# Patient Record
Sex: Female | Born: 1935 | Race: White | Hispanic: No | Smoking: Never smoker
Health system: Southern US, Community
[De-identification: ages and names within clinical notes are randomized; demographics above are authoritative.]

## PROBLEM LIST (undated history)

## (undated) DIAGNOSIS — M810 Age-related osteoporosis without current pathological fracture: Secondary | ICD-10-CM

## (undated) DIAGNOSIS — H8109 Meniere's disease, unspecified ear: Secondary | ICD-10-CM

## (undated) DIAGNOSIS — G43909 Migraine, unspecified, not intractable, without status migrainosus: Secondary | ICD-10-CM

## (undated) DIAGNOSIS — F419 Anxiety disorder, unspecified: Secondary | ICD-10-CM

## (undated) DIAGNOSIS — I10 Essential (primary) hypertension: Secondary | ICD-10-CM

## (undated) DIAGNOSIS — G4700 Insomnia, unspecified: Secondary | ICD-10-CM

## (undated) DIAGNOSIS — F32A Depression, unspecified: Secondary | ICD-10-CM

## (undated) DIAGNOSIS — R011 Cardiac murmur, unspecified: Secondary | ICD-10-CM

## (undated) DIAGNOSIS — R413 Other amnesia: Secondary | ICD-10-CM

## (undated) DIAGNOSIS — F329 Major depressive disorder, single episode, unspecified: Secondary | ICD-10-CM

## (undated) HISTORY — DX: Cardiac murmur, unspecified: R01.1

## (undated) HISTORY — PX: DILATION AND CURETTAGE OF UTERUS: SHX78

## (undated) HISTORY — DX: Insomnia, unspecified: G47.00

## (undated) HISTORY — DX: Anxiety disorder, unspecified: F41.9

## (undated) HISTORY — PX: APPENDECTOMY: SHX54

## (undated) HISTORY — DX: Other amnesia: R41.3

## (undated) HISTORY — PX: BREAST LUMPECTOMY: SHX2

## (undated) HISTORY — DX: Essential (primary) hypertension: I10

---

## 1997-09-06 ENCOUNTER — Ambulatory Visit (HOSPITAL_COMMUNITY): Admission: RE | Admit: 1997-09-06 | Discharge: 1997-09-06 | Payer: Self-pay | Admitting: Rheumatology

## 1997-11-08 ENCOUNTER — Emergency Department (HOSPITAL_COMMUNITY): Admission: EM | Admit: 1997-11-08 | Discharge: 1997-11-08 | Payer: Self-pay

## 1997-11-21 ENCOUNTER — Ambulatory Visit (HOSPITAL_COMMUNITY): Admission: RE | Admit: 1997-11-21 | Discharge: 1997-11-21 | Payer: Self-pay | Admitting: Family Medicine

## 1997-11-21 ENCOUNTER — Encounter: Payer: Self-pay | Admitting: Family Medicine

## 1998-12-05 ENCOUNTER — Encounter: Admission: RE | Admit: 1998-12-05 | Discharge: 1998-12-05 | Payer: Self-pay | Admitting: Obstetrics and Gynecology

## 1998-12-05 ENCOUNTER — Encounter: Payer: Self-pay | Admitting: Obstetrics and Gynecology

## 1999-07-19 ENCOUNTER — Encounter: Payer: Self-pay | Admitting: Obstetrics and Gynecology

## 1999-07-19 ENCOUNTER — Ambulatory Visit (HOSPITAL_COMMUNITY): Admission: RE | Admit: 1999-07-19 | Discharge: 1999-07-19 | Payer: Self-pay | Admitting: Obstetrics and Gynecology

## 1999-09-02 ENCOUNTER — Encounter: Payer: Self-pay | Admitting: Family Medicine

## 1999-09-02 ENCOUNTER — Ambulatory Visit (HOSPITAL_COMMUNITY): Admission: RE | Admit: 1999-09-02 | Discharge: 1999-09-02 | Payer: Self-pay | Admitting: Family Medicine

## 2000-05-25 ENCOUNTER — Ambulatory Visit (HOSPITAL_COMMUNITY): Admission: RE | Admit: 2000-05-25 | Discharge: 2000-05-25 | Payer: Self-pay

## 2000-05-25 ENCOUNTER — Encounter (INDEPENDENT_AMBULATORY_CARE_PROVIDER_SITE_OTHER): Payer: Self-pay

## 2000-05-27 ENCOUNTER — Other Ambulatory Visit: Admission: RE | Admit: 2000-05-27 | Discharge: 2000-05-27 | Payer: Self-pay | Admitting: Obstetrics and Gynecology

## 2000-07-28 ENCOUNTER — Encounter: Admission: RE | Admit: 2000-07-28 | Discharge: 2000-07-28 | Payer: Self-pay | Admitting: Obstetrics and Gynecology

## 2000-07-28 ENCOUNTER — Encounter: Payer: Self-pay | Admitting: Obstetrics and Gynecology

## 2000-12-16 ENCOUNTER — Encounter: Admission: RE | Admit: 2000-12-16 | Discharge: 2000-12-16 | Payer: Self-pay | Admitting: Obstetrics and Gynecology

## 2000-12-16 ENCOUNTER — Encounter: Payer: Self-pay | Admitting: Obstetrics and Gynecology

## 2001-03-02 ENCOUNTER — Ambulatory Visit (HOSPITAL_BASED_OUTPATIENT_CLINIC_OR_DEPARTMENT_OTHER): Admission: RE | Admit: 2001-03-02 | Discharge: 2001-03-02 | Payer: Self-pay | Admitting: Plastic Surgery

## 2001-03-02 ENCOUNTER — Encounter (INDEPENDENT_AMBULATORY_CARE_PROVIDER_SITE_OTHER): Payer: Self-pay | Admitting: Specialist

## 2001-06-01 ENCOUNTER — Other Ambulatory Visit: Admission: RE | Admit: 2001-06-01 | Discharge: 2001-06-01 | Payer: Self-pay | Admitting: Gynecology

## 2001-08-04 ENCOUNTER — Encounter: Payer: Self-pay | Admitting: Emergency Medicine

## 2001-08-04 ENCOUNTER — Emergency Department (HOSPITAL_COMMUNITY): Admission: EM | Admit: 2001-08-04 | Discharge: 2001-08-04 | Payer: Self-pay | Admitting: Emergency Medicine

## 2001-09-07 ENCOUNTER — Encounter: Admission: RE | Admit: 2001-09-07 | Discharge: 2001-09-07 | Payer: Self-pay | Admitting: Gynecology

## 2001-09-07 ENCOUNTER — Encounter: Payer: Self-pay | Admitting: Gynecology

## 2002-08-31 ENCOUNTER — Encounter: Payer: Self-pay | Admitting: Gynecology

## 2002-08-31 ENCOUNTER — Encounter: Admission: RE | Admit: 2002-08-31 | Discharge: 2002-08-31 | Payer: Self-pay | Admitting: Gynecology

## 2002-09-02 ENCOUNTER — Encounter: Payer: Self-pay | Admitting: Gynecology

## 2002-09-02 ENCOUNTER — Encounter: Admission: RE | Admit: 2002-09-02 | Discharge: 2002-09-02 | Payer: Self-pay | Admitting: Gynecology

## 2003-06-07 ENCOUNTER — Other Ambulatory Visit: Admission: RE | Admit: 2003-06-07 | Discharge: 2003-06-07 | Payer: Self-pay | Admitting: Gynecology

## 2003-09-05 ENCOUNTER — Encounter: Admission: RE | Admit: 2003-09-05 | Discharge: 2003-09-05 | Payer: Self-pay | Admitting: Gynecology

## 2004-09-24 ENCOUNTER — Encounter: Admission: RE | Admit: 2004-09-24 | Discharge: 2004-09-24 | Payer: Self-pay | Admitting: Gynecology

## 2005-06-26 ENCOUNTER — Other Ambulatory Visit: Admission: RE | Admit: 2005-06-26 | Discharge: 2005-06-26 | Payer: Self-pay | Admitting: Gynecology

## 2005-10-21 ENCOUNTER — Encounter: Admission: RE | Admit: 2005-10-21 | Discharge: 2005-10-21 | Payer: Self-pay | Admitting: Family Medicine

## 2006-10-28 ENCOUNTER — Encounter: Admission: RE | Admit: 2006-10-28 | Discharge: 2006-10-28 | Payer: Self-pay | Admitting: Gynecology

## 2007-10-29 ENCOUNTER — Encounter: Admission: RE | Admit: 2007-10-29 | Discharge: 2007-10-29 | Payer: Self-pay | Admitting: Family Medicine

## 2008-09-13 ENCOUNTER — Encounter (INDEPENDENT_AMBULATORY_CARE_PROVIDER_SITE_OTHER): Payer: Self-pay | Admitting: Internal Medicine

## 2008-09-13 ENCOUNTER — Inpatient Hospital Stay (HOSPITAL_COMMUNITY): Admission: EM | Admit: 2008-09-13 | Discharge: 2008-09-14 | Payer: Self-pay | Admitting: Emergency Medicine

## 2008-09-13 ENCOUNTER — Ambulatory Visit: Payer: Self-pay | Admitting: Vascular Surgery

## 2008-09-14 ENCOUNTER — Encounter (INDEPENDENT_AMBULATORY_CARE_PROVIDER_SITE_OTHER): Payer: Self-pay | Admitting: Internal Medicine

## 2008-10-31 ENCOUNTER — Encounter: Admission: RE | Admit: 2008-10-31 | Discharge: 2008-10-31 | Payer: Self-pay | Admitting: Gynecology

## 2009-01-29 ENCOUNTER — Encounter: Admission: RE | Admit: 2009-01-29 | Discharge: 2009-01-29 | Payer: Self-pay | Admitting: Psychiatry

## 2009-11-21 ENCOUNTER — Encounter: Admission: RE | Admit: 2009-11-21 | Discharge: 2009-11-21 | Payer: Self-pay | Admitting: Family Medicine

## 2010-05-01 ENCOUNTER — Other Ambulatory Visit: Payer: Self-pay | Admitting: Gastroenterology

## 2010-05-01 DIAGNOSIS — R1030 Lower abdominal pain, unspecified: Secondary | ICD-10-CM

## 2010-05-03 ENCOUNTER — Ambulatory Visit
Admission: RE | Admit: 2010-05-03 | Discharge: 2010-05-03 | Disposition: A | Discharge status: Home / Self Care | Payer: Medicare Other | Source: Ambulatory Visit | Attending: Gastroenterology | Admitting: Gastroenterology

## 2010-05-03 DIAGNOSIS — R1030 Lower abdominal pain, unspecified: Secondary | ICD-10-CM

## 2010-05-03 MED ORDER — IOHEXOL 300 MG/ML  SOLN
75.0000 mL | Freq: Once | INTRAMUSCULAR | Status: AC | PRN
  Administered 2010-05-03: 75 mL via INTRAVENOUS

## 2010-05-18 LAB — URINALYSIS, ROUTINE W REFLEX MICROSCOPIC
Glucose, UA: NEGATIVE mg/dL
Hgb urine dipstick: NEGATIVE
Specific Gravity, Urine: 1.012 (ref 1.005–1.030)
Urobilinogen, UA: 0.2 mg/dL (ref 0.0–1.0)
pH: 8.5 — ABNORMAL HIGH (ref 5.0–8.0)

## 2010-05-18 LAB — CARDIAC PANEL(CRET KIN+CKTOT+MB+TROPI)
Relative Index: INVALID (ref 0.0–2.5)
Relative Index: INVALID (ref 0.0–2.5)
Total CK: 66 U/L (ref 7–177)
Troponin I: 0.02 ng/mL (ref 0.00–0.06)

## 2010-05-18 LAB — DIFFERENTIAL
Basophils Absolute: 0 10*3/uL (ref 0.0–0.1)
Eosinophils Absolute: 0.1 10*3/uL (ref 0.0–0.7)
Eosinophils Relative: 1 % (ref 0–5)
Lymphocytes Relative: 12 % (ref 12–46)

## 2010-05-18 LAB — POCT CARDIAC MARKERS

## 2010-05-18 LAB — CBC
Hemoglobin: 13.1 g/dL (ref 12.0–15.0)
MCV: 93.7 fL (ref 78.0–100.0)
RBC: 4.17 MIL/uL (ref 3.87–5.11)
WBC: 6.2 10*3/uL (ref 4.0–10.5)

## 2010-05-18 LAB — COMPREHENSIVE METABOLIC PANEL
ALT: 19 U/L (ref 0–35)
AST: 28 U/L (ref 0–37)
CO2: 27 mEq/L (ref 19–32)
Chloride: 106 mEq/L (ref 96–112)
Creatinine, Ser: 0.66 mg/dL (ref 0.4–1.2)
GFR calc Af Amer: >60 mL/min (ref >60)
GFR calc non Af Amer: >60 mL/min (ref >60)
Glucose, Bld: 95 mg/dL (ref 70–99)
Total Bilirubin: 0.6 mg/dL (ref 0.3–1.2)

## 2010-06-25 NOTE — H&P (Signed)
[name], [name] NO.:  [id number]   MEDICAL RECORD NO.:  [id number]          PATIENT TYPE:  EMS   LOCATION:  ED                           FACILITY:  [facility]   PHYSICIAN:  Hollice Espy, M.D.DATE OF BIRTH:  10-Sep-1932   DATE OF ADMISSION:  09/13/2008  DATE OF DISCHARGE:                              HISTORY & PHYSICAL   CHIEF COMPLAINT:  Passed out.   HISTORY OF PRESENT ILLNESS:  Jeanne Lopez is a 77 year old female who  presented to the emergency department following a syncopal event.  She  notes that she had a mechanical fall yesterday, tripping on a curb while  walking into a drugstore.  She injured her right hand during that fall.  Today, she got up to go to the bathroom this morning and felt dizzy.  She made it to the toilet where she urinated.  Upon standing, she felt  more dizzy, attempted to walk back to her bed, but passed out en route,  striking her brow bone on a chair.  The daughter found her and noted  that she had some left upper extremity twitching and loss of  consciousness for approximately 1 minute.  This was followed by an  approximately 10-15 minute period of altered mental status.  She was  brought to the [facility] Emergency Department and we were asked to  admit for further evaluation and treatment.   ALLERGIES:  CODEINE causes vomiting.   PAST MEDICAL HISTORY:  1. GERD.  2. Meniere's disease.  3. Basal cell carcinoma of the right forehead and left cheek.  4. History of tachycardia.   PAST SURGICAL HISTORY:  1. Excision of skin cancer January 2003.  2. Cyst removal from breast.   SOCIAL HISTORY:  She lives with her daughter.  She never drank, smoked,  or did drugs.  She is widowed.  She has 3 children.   FAMILY HISTORY:  Mother deceased at age 46 secondary to MI.  Dad  deceased at age 65 secondary to pneumonia.   MEDICATIONS:  1. Valium 1 mg p.o. q.12 hours p.r.n. dizziness.  2. Boniva 150 mg p.o. monthly.  3. Cymbalta  30 mg p.o. t.i.d.  4. Ambien 5 mg tablets 1/2 tablet p.o. nightly p.r.n. for sleep.  5. Calcium plus D 600 mg p.o. b.i.d.  6. Imitrex 50 mg p.o. daily as needed for headache.  7. Miralax 17 g p.o. daily as needed.  8. Vitamin D 1000 IU p.o. daily.  9. Multivitamin 1 tab p.o. daily.   REVIEW OF SYSTEMS:  GENERAL:  She easily tires.  Denies fever.  CARDIOVASCULAR:  No chest pain.  No palpitations.  RESPIRATORY:  Positive shortness of breath/dyspnea on exertion.  Denies orthopnea.  Has had positive cough.  GASTROINTESTINAL:  Denies nausea, vomiting, or  diarrhea.  GENITOURINARY:  Denies dysuria or hematuria.  MUSCULOSKELETAL:  Positive pain in the right hand.  NEURO:  Denies  blurred vision or numbness.  PSYCHIATRIC:  Mild depression/anxiety.  HEMATOLOGIC:  Denies bruising or bleeding.  ENT:  Had sore throat a few  days ago, which has now resolved.  RADIOLOGY:  CT head negative for bleed or acute intracranial process.  Chest x-ray, no active disease.  X-ray of the right hand, no acute  fracture.  Does note remote trauma to the fifth metacarpal and advanced  osteoarthritis with questionable erosive component.   DIAGNOSTIC STUDIES:  Sodium 136, potassium 4.3, chloride 106, bicarb 27,  BUN 12, creatinine 0.66, glucose 95.  Point of care enzymes negative.  Hemoglobin 15.1, hematocrit 39.1, white blood cell count 6.2, platelets  200,000.  Urinalysis negative.   PHYSICAL EXAM:  VITAL SIGNS:  BP 122/60, heart rate 87, respiratory rate  18, temperature 98.7, O2 saturation 97% on room air.  GENERAL:  Elderly white female awake and alert, in no acute distress.  NECK:  No masses.  CARDIOVASCULAR:  S1, S2 regular rate and rhythm.  No lower extremity  edema.  No carotid bruits noted.  RESPIRATORY:  Breath sounds clear to auscultation bilaterally without  wheeze, rales, or rhonchi.  No increased work of breathing.  ABDOMEN:  Soft, nontender, nondistended.  Positive bowel sounds noted.   PSYCHIATRIC:  Alert and oriented x3.  Pleasant.  NEURO:  Moving all extremities.  Speech is clear.  Strong and equal hand  grasps bilaterally.  Positive facial symmetry.  MUSCULOSKELETAL:  Positive redness and swelling to the right hand.  SKIN:  Positive ecchymosis to the left brow.   ASSESSMENT AND PLAN:  1. Syncope with fall.  The patient also has some left upper extremity      shaking with this event.  The dizziness leading up to the event      seems most consistent with syncope, more so than seizure.  We will      monitor the patient on telemetry, plan to cycle cardiac enzymes,      check carotid duplex and 2D echo.  Her CT of her head was negative.      We will also check orthostatics and a D-dimer secondary to the      patient's complaint of shortness of breath to rule out pulmonary      embolism.  2. History of Meniere's disease.  Question if this may be contributing      to #1.  This will need outpatient followup.  3. History of gastroesophageal reflux disease.  No complaints.  Not      currently on PPI.  Will monitor.  4. History of tachycardia.  Her heart rate is stable today.  We will      check a TSH and place her in a telemetry bed.  5. History of basal cell carcinoma, status post resection.  Plan      outpatient followup.  6. History of injury to right hand.  No fracture on x-ray.  Plan to      apply ice p.r.n.  7. Osteoporosis.  On Boniva, calcium, and vitamin D.  8. Depression/anxiety.  Continue Cymbalta, stable.  9. History of migraine headaches.  Continue p.r.n. Imitrex.  She notes      that she uses this drug several times per week at home.      Sandford Craze, NP      Hollice Espy, M.D.  Electronically Signed    MO/MEDQ  D:  09/13/2008  T:  09/13/2008  Job:  [id number]   cc:   Dario Guardian, M.D.  Fax: [phone]

## 2010-06-28 NOTE — Op Note (Signed)
[facility]  Patient:    SHAI, RASMUSSEN Visit Number: [id number] MRN: [id number]          Service Type: DSU Location: [facility] Attending Physician:  Loura Halt Ii Dictated by:   Alfredia Ferguson, M.D. Proc. Date: 03/02/01 Admit Date:  03/02/2001 Discharge Date: 03/02/2001   CC:         Dr. Venancio Poisson   Operative Report  PREOPERATIVE DIAGNOSIS: 1. Biopsy proven basal cell carcinoma, right forehead just above eyebrow, 3 mm    in diameter. 2. A 5 mm basal cell carcinoma, left cheek.  POSTOPERATIVE DIAGNOSIS: 1. Biopsy proven basal cell carcinoma, right forehead just above eyebrow, 3 mm    in diameter. 2. A 5 mm basal cell carcinoma, left cheek.  OPERATION PERFORMED: 1. Excision basal cell carcinoma, right forehead above eyebrow. 2. Excision of biopsy proven basal cell carcinoma, left cheek.  SURGEON:  Alfredia Ferguson, M.D.  ANESTHESIA:  2% Xylocaine 1:100,000 epinephrine.  INDICATIONS FOR PROCEDURE:  The patient is a 77 year old woman with biopsy proven basal cell carcinoma in two locations on her face.  She wishes to have these areas excised to  verify clear margins.  She understands she will be trading what she has for a permanent potentially unsightly scar.  In spite of that, she wishes to proceed with the operation.  DESCRIPTION OF PROCEDURE:  Elliptical skin markers were placed around the lesion on the right forehead and also the left cheek.  Local anesthesia using 2% Xylocaine 1:100,000 epinephrine was infiltrated.  The patients face was prepped and draped in sterile fashion.  After waiting approximately 10 minutes, an elliptical excision of the lesion above the eyebrow was carried out.  The wound edges were undermined for a distance of 2 to 3 mm in all directions.  Hemostasis accomplished using cautery. The wound was closed by approximating the dermis using interrupted 5-0 Vicryl suture.  The skin was united using an  interrupted 6-0 nylon suture.  Attention was directed to the left cheek where an elliptical excision of the biopsy site was carried out. The lesion was passed off for pathology as was the one above the eyebrow.  The wound edges were undermined for a distance of several millimeters in all directions.  Hemostasis was accomplished using cautery.  After achieving hemostasis, the wound was approximated using 5-0 Vicryl suture for the dermis followed by a running 6-0 nylon suture for the skin edges.  Light dressings were applied to both areas.  The patient tolerated the procedure well and was discharged home in satisfactory condition. Dictated by:   Alfredia Ferguson, M.D. Attending Physician:  Loura Halt Ii DD:  03/16/01 TD:  03/17/01 Job: [id number]

## 2010-06-28 NOTE — Procedures (Signed)
[facility]  Patient:    Jeanne Lopez, Jeanne Lopez                      MRN: [id number] Proc. Date: 05/25/00 Adm. Date:  [id number] Disc. Date: [id number] Attending:  Louie Bun CC:         Dario Guardian, M.D.   Procedure Report  PROCEDURE:  Colonoscopy.  INDICATION FOR PROCEDURE:  Family history of colon polyps.  DESCRIPTION OF PROCEDURE:  The patient was placed in the left lateral decubitus position then placed on the pulse monitor with continuous low flow oxygen delivered by nasal cannula. She was sedated with 70 mg IV Demerol and 8 mg IV Versed. The Olympus video colonoscope was inserted into the rectum and advanced to the cecum, confirmed by transillumination at McBurneys point and visualization of the ileocecal valve and appendiceal orifice. The prep was excellent. The cecum, ascending, transverse, descending and sigmoid colon all appeared normal with no masses, polyps, diverticula or other mucosal abnormalities. Within the rectum, there were two small polyps at approximately 12 cm adjacent to each other one 1 cm in diameter and the other 8 mm in diameter and these were both fulgurated by hot biopsy. The remainder of the rectum appeared normal with no further masses, polyps, diverticula or other mucosal abnormalities. The scope was then withdrawn and the patient returned to the recovery room in stable condition. The patient tolerated the procedure well and there were no immediate complications.  IMPRESSION:  Small rectal polyps otherwise normal colonoscopy.  PLAN:  Await biopsy results for determination of method and interval for future colon screening. DD:  05/25/00 TD:  05/25/00 Job: [id number]

## 2010-10-10 ENCOUNTER — Other Ambulatory Visit: Payer: Self-pay | Admitting: Family Medicine

## 2010-10-10 DIAGNOSIS — Z1231 Encounter for screening mammogram for malignant neoplasm of breast: Secondary | ICD-10-CM

## 2010-11-18 ENCOUNTER — Ambulatory Visit
Admission: RE | Admit: 2010-11-18 | Discharge: 2010-11-18 | Disposition: A | Discharge status: Home / Self Care | Payer: Medicare Other | Source: Ambulatory Visit | Attending: Family Medicine | Admitting: Family Medicine

## 2010-11-18 DIAGNOSIS — Z1231 Encounter for screening mammogram for malignant neoplasm of breast: Secondary | ICD-10-CM

## 2010-11-25 ENCOUNTER — Ambulatory Visit: Payer: Medicare Other

## 2011-01-27 ENCOUNTER — Other Ambulatory Visit: Payer: Self-pay | Admitting: Neurology

## 2011-01-27 DIAGNOSIS — IMO0002 Reserved for concepts with insufficient information to code with codable children: Secondary | ICD-10-CM

## 2011-01-31 ENCOUNTER — Ambulatory Visit
Admission: RE | Admit: 2011-01-31 | Discharge: 2011-01-31 | Disposition: A | Discharge status: Home / Self Care | Payer: Medicare Other | Source: Ambulatory Visit | Attending: Neurology | Admitting: Neurology

## 2011-01-31 DIAGNOSIS — IMO0002 Reserved for concepts with insufficient information to code with codable children: Secondary | ICD-10-CM

## 2011-10-28 ENCOUNTER — Other Ambulatory Visit: Payer: Self-pay | Admitting: Family Medicine

## 2011-10-28 DIAGNOSIS — Z1231 Encounter for screening mammogram for malignant neoplasm of breast: Secondary | ICD-10-CM

## 2011-11-20 ENCOUNTER — Ambulatory Visit
Admission: RE | Admit: 2011-11-20 | Discharge: 2011-11-20 | Disposition: A | Discharge status: Home / Self Care | Payer: Medicare Other | Source: Ambulatory Visit | Attending: Family Medicine | Admitting: Family Medicine

## 2011-11-20 DIAGNOSIS — Z1231 Encounter for screening mammogram for malignant neoplasm of breast: Secondary | ICD-10-CM

## 2012-08-01 ENCOUNTER — Encounter (HOSPITAL_COMMUNITY): Payer: Self-pay | Admitting: Emergency Medicine

## 2012-08-01 ENCOUNTER — Emergency Department (HOSPITAL_COMMUNITY)
Admission: EM | Admit: 2012-08-01 | Discharge: 2012-08-01 | Disposition: A | Discharge status: Home / Self Care | Payer: Medicare Other | Attending: Emergency Medicine | Admitting: Emergency Medicine

## 2012-08-01 ENCOUNTER — Emergency Department (HOSPITAL_COMMUNITY): Payer: Medicare Other

## 2012-08-01 DIAGNOSIS — R42 Dizziness and giddiness: Secondary | ICD-10-CM | POA: Insufficient documentation

## 2012-08-01 DIAGNOSIS — F3289 Other specified depressive episodes: Secondary | ICD-10-CM | POA: Insufficient documentation

## 2012-08-01 DIAGNOSIS — R55 Syncope and collapse: Secondary | ICD-10-CM | POA: Insufficient documentation

## 2012-08-01 DIAGNOSIS — IMO0002 Reserved for concepts with insufficient information to code with codable children: Secondary | ICD-10-CM | POA: Insufficient documentation

## 2012-08-01 DIAGNOSIS — F329 Major depressive disorder, single episode, unspecified: Secondary | ICD-10-CM | POA: Insufficient documentation

## 2012-08-01 DIAGNOSIS — G43909 Migraine, unspecified, not intractable, without status migrainosus: Secondary | ICD-10-CM | POA: Insufficient documentation

## 2012-08-01 DIAGNOSIS — M810 Age-related osteoporosis without current pathological fracture: Secondary | ICD-10-CM | POA: Insufficient documentation

## 2012-08-01 DIAGNOSIS — Z8669 Personal history of other diseases of the nervous system and sense organs: Secondary | ICD-10-CM | POA: Insufficient documentation

## 2012-08-01 DIAGNOSIS — S8000XA Contusion of unspecified knee, initial encounter: Secondary | ICD-10-CM | POA: Insufficient documentation

## 2012-08-01 DIAGNOSIS — S8002XA Contusion of left knee, initial encounter: Secondary | ICD-10-CM

## 2012-08-01 DIAGNOSIS — Y92410 Unspecified street and highway as the place of occurrence of the external cause: Secondary | ICD-10-CM | POA: Insufficient documentation

## 2012-08-01 DIAGNOSIS — R296 Repeated falls: Secondary | ICD-10-CM | POA: Insufficient documentation

## 2012-08-01 DIAGNOSIS — Z7983 Long term (current) use of bisphosphonates: Secondary | ICD-10-CM | POA: Insufficient documentation

## 2012-08-01 DIAGNOSIS — Y9301 Activity, walking, marching and hiking: Secondary | ICD-10-CM | POA: Insufficient documentation

## 2012-08-01 DIAGNOSIS — Z79899 Other long term (current) drug therapy: Secondary | ICD-10-CM | POA: Insufficient documentation

## 2012-08-01 HISTORY — DX: Meniere's disease, unspecified ear: H81.09

## 2012-08-01 HISTORY — DX: Age-related osteoporosis without current pathological fracture: M81.0

## 2012-08-01 HISTORY — DX: Depression, unspecified: F32.A

## 2012-08-01 HISTORY — DX: Major depressive disorder, single episode, unspecified: F32.9

## 2012-08-01 HISTORY — DX: Migraine, unspecified, not intractable, without status migrainosus: G43.909

## 2012-08-01 LAB — CBC WITH DIFFERENTIAL/PLATELET
Hemoglobin: 12.3 g/dL (ref 12.0–15.0)
Lymphocytes Relative: 27 % (ref 12–46)
Lymphs Abs: 1.6 10*3/uL (ref 0.7–4.0)
Neutro Abs: 3.4 10*3/uL (ref 1.7–7.7)
Neutrophils Relative %: 56 % (ref 43–77)
Platelets: 145 10*3/uL — ABNORMAL LOW (ref 150–400)
RBC: 4.01 MIL/uL (ref 3.87–5.11)
WBC: 6 10*3/uL (ref 4.0–10.5)

## 2012-08-01 LAB — POCT I-STAT, CHEM 8
BUN: 17 mg/dL (ref 6–23)
Chloride: 97 mEq/L (ref 96–112)
Creatinine, Ser: 0.8 mg/dL (ref 0.50–1.10)
Hemoglobin: 13.6 g/dL (ref 12.0–15.0)
Potassium: 3.9 mEq/L (ref 3.5–5.1)
Sodium: 133 mEq/L — ABNORMAL LOW (ref 135–145)

## 2012-08-01 NOTE — ED Notes (Signed)
[id number]<AV> Expected date:<BR> Expected time:<BR> Means of arrival:<BR> Comments:<BR>

## 2012-08-01 NOTE — ED Provider Notes (Signed)
History    CSN: [id number] Arrival date & time 08/01/12  0720 First MD Initiated Contact with Patient 08/01/12 [phone]      Chief Complaint  Patient presents with  . Fall  . Knee Injury   HPI  79 year old female presents with a fall due to knee pain.   Patient was out walking her dog yesterday (20 minute walk, hot day, had not had water yet that day). States she felt slightly lightheaded but no chest pain, shortness of breath, palpitations, neck or arm pain, diaphoresis, sweating. Before she had a chance to sit down, states she passed out and next thing she remember she was getting off the ground. Denies fecal or urinary incontinence or tongue biting. Noted she had bruised/injured left knee, right side of nose, and left forehead slightly. Able to walk home without difficulty. Was cleaned up by daughters. They did not bring her to ED because she felt reasonably well and she had a history of syncope in the past few years which was being followed by her primary care doctor. No blood from nose or mouth was noted after fall.  This morning, stood up and walked to end of bed. Knee has swollen overnight and was very painful, patient fell to ground as a result. Did not hit her head. Did not have chest pain, shortness of breath, palpitations, fecal/urinary incontinence. Was a little shaken up by the pain but once she got up, her pain improved. She has not been lightheaded, dizzy or passed out since yesterday.   Past Medical History  Diagnosis Date  . Osteoporosis   . Migraine   . Meniere's disease   . Depression     History reviewed. No pertinent past surgical history.  No family history on file.  History  Substance Use Topics  . Smoking status: Never Smoker   . Smokeless tobacco: Not on file  . Alcohol Use: No   History   Social History Narrative   Lives with daughter and dog. Grandchildren in frequently to help her.    Retired.     Review of Systems A full 10 point review of symptoms  was performed and was negative except as noted in HPI.   Allergies  Review of patient's allergies indicates no known allergies.  Home Medications   Current Outpatient Rx  Name  Route  Sig  Dispense  Refill  . ibandronate (BONIVA) 150 MG tablet   Oral   Take 150 mg by mouth every 30 (thirty) days. Take in the morning with a full glass of water, on an empty stomach, and do not take anything else by mouth or lie down for the next 30 min.         . SUMAtriptan Succinate (IMITREX PO)   Oral   Take 1 tablet by mouth daily as needed (migraine).         . venlafaxine XR (EFFEXOR-XR) 150 MG 24 hr capsule   Oral   Take 150 mg by mouth daily.           BP 110/59  Pulse 68  Temp(Src) 97.8 F (36.6 C) (Oral)  Resp 16  SpO2 98%  Physical Exam  Constitutional: She is oriented to person, place, and time. She appears well-developed and well-nourished. No distress.  HENT:  Head: Normocephalic.  Small abrasion to right upper nose and left forehead. Nontender to palpation.   Eyes: EOM are normal. Pupils are equal, round, and reactive to light.  Neck: Normal range of  motion. Neck supple.  Cardiovascular: Normal rate and regular rhythm.  Exam reveals no gallop and no friction rub.   No murmur heard. Pulmonary/Chest: Effort normal and breath sounds normal. She has no wheezes. She has no rales.  Abdominal: Soft. Bowel sounds are normal. There is no tenderness. There is no rebound.  Musculoskeletal: She exhibits edema (around knee but without effusion).  Left Knee: Obvious bruising diffusely around patella, mild warmth. Otherwise no effusion or obvious bony abnormalities.  Palpation normal with no joint line tenderness or patellar tenderness or condyle tenderness. ROM normal in flexion (but patient moves slowly) and extension and lower leg rotation. Ligaments with solid consistent endpoints including ACL, PCL, LCL, MCL. Patient holds leg cautiously and does not allow Mcmurray's testing.   Non painful patellar compression. Patellar and quadriceps tendons unremarkable. Hamstring and quadriceps strength is normal.   Neurological: She is alert and oriented to person, place, and time. No cranial nerve deficit. She exhibits normal muscle tone. Coordination normal.  Skin: Skin is warm and dry.     Date: 08/01/2012  Rate: 77  Rhythm: normal sinus rhythm  QRS Axis: left  Intervals: normal  ST/T Wave abnormalities: t wave inversion in avl, v1, v2.   Conduction Disutrbances:none  Narrative Interpretation: possible right axis hypertrophy  Old EKG Reviewed: none available   ED Course  Procedures (including critical care time)  Labs Reviewed  CBC WITH DIFFERENTIAL - Abnormal; Notable for the following:    Platelets 145 (*)    Monocytes Relative 15 (*)    All other components within normal limits  POCT I-STAT, CHEM 8 - Abnormal; Notable for the following:    Sodium 133 (*)    All other components within normal limits   Dg Knee Complete 4 Views Left  08/01/2012   *RADIOLOGY REPORT*  Clinical Data: Fall with left knee injury and pain.  LEFT KNEE - COMPLETE 4+ VIEW  Comparison: None.  Findings: No acute fracture or dislocation is identified.  Very mild degenerative changes are present.  No joint effusion, bony lesion or soft tissue foreign body is seen.  IMPRESSION: No acute findings.  Mild degenerative changes.   Original Report Authenticated By: Irish Lack, M.D.    1. Knee contusion, left, initial encounter   2. Syncope    MDM  Patient presents today primarily for knee pain after a fall. No fracture on x-ray and stable knee exam without ligamentous injury. Injury primarily due to contusion/bruising. Patient to ice knee. Family has a walker at home which she will start using. Tylenol for pain.   Explained to patient we are more concerned about her syncopal event. She has had echo, carotid dopplers, had negative CT head/MRI in 2010. Patient was not worked up for seizures  at that time and she states she did not wear an event monitor although she is not sure. Both of these can be completed outpatient as patient currently asymptomatic and only issue at present time is fall on knee. She is to follow up early this week. If any recurrence of symptoms, will return to ED for repeat syncope evaluation. Very mild hyponatremia also noted-possibly dehydration related-did not give fluid bolus but discussed encouraging PO intake and consider repeat labs with PCP.    Shelva Majestic, MD 08/01/12 [phone]

## 2012-08-01 NOTE — ED Notes (Signed)
Per EMS: fell yesterday walking on [address]. abrasion to forehead and nose, left knee injury. Denies LOC. Larey Seat this morning due to left knee injury, no other injuries.

## 2012-08-01 NOTE — ED Notes (Signed)
Patient transported to X-ray 

## 2012-08-01 NOTE — ED Provider Notes (Signed)
I saw and evaluated the patient, reviewed the resident's note and I agree with the findings and plan.   .Face to face Exam:  General:  Awake HEENT:  Atraumatic Resp:  Normal effort Abd:  Nondistended Neuro:No focal weakness   Nelia Shi, MD 08/01/12 1406

## 2012-11-29 ENCOUNTER — Other Ambulatory Visit: Payer: Self-pay

## 2012-11-29 DIAGNOSIS — Z1231 Encounter for screening mammogram for malignant neoplasm of breast: Secondary | ICD-10-CM

## 2012-12-29 ENCOUNTER — Ambulatory Visit
Admission: RE | Admit: 2012-12-29 | Discharge: 2012-12-29 | Disposition: A | Discharge status: Home / Self Care | Payer: 59 | Source: Ambulatory Visit

## 2012-12-29 DIAGNOSIS — Z1231 Encounter for screening mammogram for malignant neoplasm of breast: Secondary | ICD-10-CM

## 2013-08-11 ENCOUNTER — Encounter: Payer: Self-pay | Admitting: Internal Medicine

## 2013-08-11 ENCOUNTER — Ambulatory Visit (INDEPENDENT_AMBULATORY_CARE_PROVIDER_SITE_OTHER): Payer: 59 | Admitting: Internal Medicine

## 2013-08-11 ENCOUNTER — Telehealth: Payer: Self-pay | Admitting: *Deleted

## 2013-08-11 VITALS — BP 112/64 | HR 88 | Temp 98.4°F | Resp 12 | Ht 62.0 in | Wt 103.0 lb

## 2013-08-11 DIAGNOSIS — M810 Age-related osteoporosis without current pathological fracture: Secondary | ICD-10-CM | POA: Insufficient documentation

## 2013-08-11 LAB — BASIC METABOLIC PANEL
BUN: 12 mg/dL (ref 6–23)
CO2: 31 mEq/L (ref 19–32)
CREATININE: 0.7 mg/dL (ref 0.4–1.2)
Calcium: 9.7 mg/dL (ref 8.4–10.5)
Chloride: 98 mEq/L (ref 96–112)
GFR: 91.86 mL/min (ref >60.00)
GLUCOSE: 96 mg/dL (ref 70–99)
Potassium: 4.7 mEq/L (ref 3.5–5.1)
Sodium: 135 mEq/L (ref 135–145)

## 2013-08-11 LAB — T4, FREE: Free T4: 0.81 ng/dL (ref 0.60–1.60)

## 2013-08-11 LAB — TSH: TSH: 0.9 u[IU]/mL (ref 0.35–4.50)

## 2013-08-11 LAB — T3, FREE: T3, Free: 2.3 pg/mL (ref 2.3–4.2)

## 2013-08-11 LAB — VITAMIN D 25 HYDROXY (VIT D DEFICIENCY, FRACTURES): VITD: 57.99 ng/mL

## 2013-08-11 NOTE — Patient Instructions (Signed)
Please stop at the lab.  We will start the PA for Prolia. See info about Denosumab.  Denosumab: Patient drug information (Up-to-date) Copyright [id number] [place] rights reserved.  Brand Names: [place]  ProliaDelton See What is this drug used for?  It is used to treat soft, brittle bones (osteoporosis).  It is used for bone growth.  It is used when treating some cancers.  It may be given to you for other reasons. Talk with the doctor. What do I need to tell my doctor BEFORE I take this drug?  All products:  If you have an allergy to denosumab or any other part of this drug.  If you are allergic to any drugs like this one, any other drugs, foods, or other substances. Tell your doctor about the allergy and what signs you had, like rash; hives; itching; shortness of breath; wheezing; cough; swelling of face, lips, tongue, or throat; or any other signs.  If you have low calcium levels.  Prolia:  If you are pregnant or may be pregnant. Do not take this drug if you are pregnant.  This is not a list of all drugs or health problems that interact with this drug.  Tell your doctor and pharmacist about all of your drugs (prescription or OTC, natural products, vitamins) and health problems. You must check to make sure that it is safe for you to take this drug with all of your drugs and health problems. Do not start, stop, or change the dose of any drug without checking with your doctor. What are some things I need to know or do while I take this drug?  All products:  Tell dentists, surgeons, and other doctors that you use this drug.  This drug may raise the chance of a broken leg. Talk with your doctor.  Have your blood work checked. Talk with your doctor.  Have a bone density test. Talk with your doctor.  Take calcium and vitamin D as you were told by your doctor.  Have a dental exam before starting this drug.  Take good care of your teeth. See a dentist often.  If you  smoke, talk with your doctor.  Do not give to a child. Talk with your doctor.  Tell your doctor if you are breast-feeding. You will need to talk about any risks to your baby.  Delton See:  This drug may cause harm to the unborn baby if you take it while you are pregnant. If you get pregnant while taking this drug, call your doctor right away.  Prolia:  Very bad infections have been reported with use of this drug. If you have any infection, are taking antibiotics now or in the recent past, or have many infections, talk with your doctor.  You may have more chance of getting an infection. Wash hands often. Stay away from people with infections, colds, or flu.  Use birth control that you can trust to prevent pregnancy while taking this drug.  If you are a man and your sex partner is pregnant or gets pregnant at any time while you are being treated, talk with your doctor. What are some side effects that I need to call my doctor about right away?  WARNING/CAUTION: Even though it may be rare, some people may have very bad and sometimes deadly side effects when taking a drug. Tell your doctor or get medical help right away if you have any of the following signs or symptoms that may be related to a  very bad side effect:  All products:  Signs of an allergic reaction, like rash; hives; itching; red, swollen, blistered, or peeling skin with or without fever; wheezing; tightness in the chest or throat; trouble breathing or talking; unusual hoarseness; or swelling of the mouth, face, lips, tongue, or throat.  Signs of low calcium levels like muscle cramps or spasms, numbness and tingling, or seizures.  Mouth sores.  Any new or strange groin, hip, or thigh pain.  This drug may cause jawbone problems. The chance may be higher the longer you take this drug. The chance may be higher if you have cancer, dental problems, dentures that do not fit well, anemia, blood clotting problems, or an infection. The  chance may also be higher if you are having dental work or if you are getting chemo, some steroid drugs, or radiation. Call your doctor right away if you have jaw swelling or pain.  Xgeva:  Not hungry.  Muscle pain or weakness.  Seizures.  Shortness of breath.  Prolia:  Signs of infection. These include a fever of 100.71F (38C) or higher, chills, very bad sore throat, ear or sinus pain, cough, more sputum or change in color of sputum, pain with passing urine, mouth sores, wound that will not heal, or anal itching or pain.  Signs of a pancreas problem (pancreatitis) like very bad stomach pain, very bad back pain, or very bad upset stomach or throwing up.  Chest pain.  A heartbeat that does not feel normal.  Very bad skin irritation.  Feeling very tired or weak.  Bladder pain or pain when passing urine or change in how much urine is passed.  Passing urine often.  Swelling in the arms or legs. What are some other side effects of this drug?  All drugs may cause side effects. However, many people have no side effects or only have minor side effects. Call your doctor or get medical help if any of these side effects or any other side effects bother you or do not go away:  Xgeva:  Feeling tired or weak.  Headache.  Upset stomach or throwing up.  Loose stools (diarrhea).  Cough.  Prolia:  Back pain.  Muscle or joint pain.  Sore throat.  Runny nose.  Pain in arms or legs.  These are not all of the side effects that may occur. If you have questions about side effects, call your doctor. Call your doctor for medical advice about side effects.  You may report side effects to your national health agency. How is this drug best taken?  Use this drug as ordered by your doctor. Read and follow the dosing on the label closely.  It is given as a shot into the fatty part of the skin. What do I do if I miss a dose?  Call the doctor to find out what to do. How do I store  and/or throw out this drug?  This drug will be given to you in a hospital or doctor's office. You will not store it at home.  Keep all drugs out of the reach of children and pets.  Check with your pharmacist about how to throw out unused drugs.  General drug facts  If your symptoms or health problems do not get better or if they become worse, call your doctor.  Do not share your drugs with others and do not take anyone else's drugs.  Keep a list of all your drugs (prescription, natural products, vitamins, OTC) with you. Give  this list to your doctor.  Talk with the doctor before starting any new drug, including prescription or OTC, natural products, or vitamins.  Some drugs may have another patient information leaflet. If you have any questions about this drug, please talk with your doctor, pharmacist, or other health care provider.  If you think there has been an overdose, call your poison control center or get medical care right away. Be ready to tell or show what was taken, how much, and when it happened.  How Can I Prevent Falls? Men and women with osteoporosis need to take care not to fall down. Falls can break bones. Some reasons people fall are: Poor vision  Poor balance  Certain diseases that affect how you walk  Some types of medicine, such as sleeping pills.  Some tips to help prevent falls outdoors are: Use a cane or walker  Wear rubber-soled shoes so you don't slip  Walk on grass when sidewalks are slippery  In winter, put salt or kitty litter on icy sidewalks.  Some ways to help prevent falls indoors are: Keep rooms free of clutter, especially on floors  Use plastic or carpet runners on slippery floors  Wear low-heeled shoes that provide good support  Do not walk in socks, stockings, or slippers  Be sure carpets and area rugs have skid-proof backs or are tacked to the floor  Be sure stairs are well lit and have rails on both sides  Put grab bars on bathroom walls  near tub, shower, and toilet  Use a rubber bath mat in the shower or tub  Keep a flashlight next to your bed  Use a sturdy step stool with a handrail and wide steps  Add more lights in rooms (and night lights) Buy a cordless phone to keep with you so that you don't have to rush to the phone       when it rings and so that you can call for help if you fall.   (adapted from http://www.niams.NightlifePreviews.se)  Dietary sources of calcium and vitamin D:  Calcium content (mg) - http://www.niams.MoviePins.co.za  Fortified oatmeal, 1 packet 350  Sardines, canned in oil, with edible bones, 3 oz. 324  Cheddar cheese, 1 oz. shredded 306  Milk, nonfat, 1 cup 302  Milkshake, 1 cup 300  Yogurt, plain, low-fat, 1 cup 300  Soybeans, cooked, 1 cup 261  Tofu, firm, with calcium,  cup 204  Orange juice, fortified with calcium, 6 oz. 200-260 (varies)  Salmon, canned, with edible bones, 3 oz. 181  Pudding, instant, made with 2% milk,  cup 153  Baked beans, 1 cup [place], 1% milk fat, 1 cup 138  Spaghetti, lasagna, 1 cup 125  Frozen yogurt, vanilla, soft-serve,  cup 103  Ready-to-eat cereal, fortified with calcium, 1 cup 100-1,000 (varies)  Cheese pizza, 1 slice 253  Fortified waffles, 2 100  Turnip greens, boiled,  cup 99  Broccoli, raw, 1 cup 90  Ice cream, vanilla,  cup 85  Soy or rice milk, fortified with calcium, 1 cup 80-500 (varies)   Vitamin D content (International Units, IU) - https://www.ars.usda.gov Cod liver oil, 1 tablespoon 1,360  Swordfish, cooked, 3 oz 566  Salmon (sockeye), cooked, 3 oz 447  Tuna fish, canned in water, drained, 3 oz 154  Orange juice fortified with vitamin D, 1 cup (check product labels, as amount of added vitamin D varies) 137  Milk, nonfat, reduced fat, and whole, vitamin D-fortified, 1 cup 115-124  Yogurt, fortified  with 20% of the daily value for vitamin D, 6 oz 80  Margarine,  fortified, 1 tablespoon 60  Sardines, canned in oil, drained, 2 sardines 46  Liver, beef, cooked, 3 oz 42  Egg, 1 large (vitamin D is found in yolk) 41  Ready-to-eat cereal, fortified with 10% of the daily value for vitamin D, 0.75-1 cup  40  Cheese, Swiss, 1 oz 6   Exercise for Strong Bones (from [place]) There are two types of exercises that are important for building and maintaining bone density:  weight-bearing and muscle-strengthening exercises. Weight-bearing Exercises These exercises include activities that make you move against gravity while staying upright. Weight-bearing exercises can be high-impact or low-impact. High-impact weight-bearing exercises help build bones and keep them strong. If you have broken a bone due to osteoporosis or are at risk of breaking a bone, you may need to avoid high-impact exercises. If youre not sure, you should check with your healthcare provider. Examples of high-impact weight-bearing exercises are:   Dancing   Doing high-impact aerobics   Hiking   Jogging/running   Jumping Rope   Stair climbing   Tennis Low-impact weight-bearing exercises can also help keep bones strong and are a safe alternative if you cannot do high-impact exercises. Examples of low-impact weight-bearing exercises are:   Using elliptical training machines   Doing low-impact aerobics   Using stair-step machines   Fast walking on a treadmill or outside Muscle-Strengthening Exercises These exercises include activities where you move your body, a weight or some other resistance against gravity. They are also known as resistance exercises and include:   Lifting weights   Using elastic exercise bands   Using weight machines   Lifting your own body weight   Functional movements, such as standing and rising up on your toes Yoga and Pilates can also improve strength, balance and flexibility. However, certain positions may not be safe for people with  osteoporosis or those at increased risk of broken bones. For example, exercises that have you bend forward may increase the chance of breaking a bone in the spine. A physical therapist should be able to help you learn which exercises are safe and appropriate for you. Non-Impact Exercises Non-impact exercises can help you to improve balance, posture and how well you move in everyday activities. These exercises can also help to increase muscle strength and decrease the risk of falls and broken bones. Some of these exercises include:   Balance exercises that strengthen your legs and test your balance, such as Tai Chi, can decrease your risk of falls.   Posture exercises that improve your posture and reduce rounded or sloping shoulders can help you decrease the chance of breaking a bone, especially in the spine.   Functional exercises that improve how well you move can help you with everyday activities and decrease your chance of falling and breaking a bone. For example, if you have trouble getting up from a chair or climbing stairs, you should do these activities as exercises. A physical therapist can teach you balance, posture and functional exercises. Starting a New Exercise Program If you havent exercised regularly for a while, check with your healthcare provider before beginning a new exercise program--particularly if you have health problems such as heart disease, diabetes or high blood pressure. If youre at high risk of breaking a bone, you should work with a physical therapist to develop a safe exercise program. Once you have your healthcare providers approval, start slowly. If youve already  broken bones in the spine because of osteoporosis, be very careful to avoid activities that require reaching down, bending forward, rapid twisting motions, heavy lifting and those that increase your chance of a fall. As you get started, your muscles may feel sore for a day or two after you exercise. If  soreness lasts longer, you may be working too hard and need to ease up. Exercises should be done in a pain-free range of motion. How Much Exercise Do You Need? Weight-bearing exercises 30 minutes on most days of the week. Do a 30-minutesession or multiple sessions spread out throughout the day. The benefits to your bones are the same.   Muscle-strengthening exercises Two to three days per week. If you dont have much time for strengthening/resistance training, do small amounts at a time. You can do just one body part each day. For example do arms one day, legs the next and trunk the next. You can also spread these exercises out during your normal day.  Balance, posture and functional exercises Every day or as often as needed. You may want to focus on one area more than the others. If you have fallen or lose your balance, spend time doing balance exercises. If you are getting rounded shoulders, work more on posture exercises. If you have trouble climbing stairs or getting up from the couch, do more functional exercises. You can also perform these exercises at one time or spread them during your day. Work with a phyiscal therapist to learn the right exercises for you.    Please return in 1 year.

## 2013-08-11 NOTE — Telephone Encounter (Signed)
Rose, can you begin a Prior Authorization for Prolia for Ms Jeanne Lopez for Dr Elvera LennoxGherghe. Thank you.

## 2013-08-11 NOTE — Progress Notes (Signed)
Patient ID: Jeanne Lopez, female   DOB: 05/03/1932, 80 y.o.   MRN: [id number]   HPI  Jeanne Lopez is a 80 y.o.-year-old female, referred by her PCP, Dr. Severiano Gilbertandice Smith for management of osteoporosis.  Pt was dx with OP in her 5650s. She denies any fractures, but had a vb fx found on VFA analysis in 01/2013: moderate wedge, L2. Reviewing her chart, she also had a R foot fx in 12/11/2000. She had a fall last summer (passed out), no fractures.  She also has dizziness/vertigo (Meniere's ds.) - this is controlled.  I reviewed pt's DEXA scans: Date L1-L4 (L2)T score RFN T score LFN T score 33% distal Radius  01/26/2013 [facility]) -3.2 -2.5 -3.0 -4.4  08/07/2008 ([facility]) L1-L4: -3.0 -3.0 (however, the femur not properly rotated, ROI not properly defined) -2.8 (however, the femur not properly rotated, ROI not properly defined)  n/a  12/16/2000 L1-L4: -3.7 Total Hip: -3.4    12/05/1998 L1-L4: -3.8 Total Hip: -3.3    05/18/1997 L1-L4: -3.7 Total Hip: -3.3     She has been on the following OP treatments:  - estrogen 1976-1986 - on Fosamax 1986-2003 - on Boniva 2004-now  No h/o hyper/hypocalcemia. No h/o hyperparathyroidism. No h/o kidney stones. Lab Results  Component Value Date   CALCIUM 8.7 09/13/2008   No h/o thyrotoxicosis. Reviewed TSH recent levels:  Lab Results  Component Value Date   TSH 1.529  09/13/2008   No h/o vitamin D deficiency. No vit D level available for review, but reportedly normal several years ago. Pt is on calcium (500 mg 2x a day) and vitamin D (400 units 2x a day). Combination pill - with Magnesium.  She does not eat much dairy but eats plenty of green, leafy, vegetables.   No h/o CKD. Last BUN/Cr: Lab Results  Component Value Date   BUN 17 08/01/2012   CREATININE 0.80 08/01/2012   No weight bearing exercises. Yoga 3x a week.  She does not take high vitamin A doses.  Pt does not have a FH of osteoporosis.   Menopause was at 80 y/o.    ROS: Constitutional: no weight gain/loss, no fatigue, no subjective hyperthermia/hypothermia Eyes: no blurry vision, no xerophthalmia ENT: no sore throat, no nodules palpated in throat, no dysphagia/odynophagia, no hoarseness Cardiovascular: no CP/SOB/palpitations/leg swelling Respiratory: no cough/SOB Gastrointestinal: no N/V/D/C Musculoskeletal: no muscle/joint aches Skin: no rashes Neurological: no tremors/numbness/tingling/dizziness, + HAs (migraines) Psychiatric: no depression/+ anxiety  Past Medical History  Diagnosis Date  . Osteoporosis   . Migraine   . Meniere's disease   . Depression    No past surgical history on file. History   Social History  . Marital Status: Widowed    Spouse Name: N/A    Number of Children: 3   Social History Main Topics  . Smoking status: Never Smoker   . Smokeless tobacco: Not on file  . Alcohol Use: No  . Drug Use: No   Social History Narrative   Lives with daughter and dog. Grandchildren in frequently to help her.    Retired.    Current Outpatient Prescriptions on File Prior to Visit  Medication Sig Dispense Refill  . ibandronate (BONIVA) 150 MG tablet Take 150 mg by mouth every 30 (thirty) days. Take in the morning with a full glass of water, on an empty stomach, and do not take anything else by mouth or lie down for the next 30 min.      . SUMAtriptan Succinate (IMITREX  PO) Take 1 tablet by mouth daily as needed (migraine).      . venlafaxine XR (EFFEXOR-XR) 150 MG 24 hr capsule Take 150 mg by mouth daily.       No current facility-administered medications on file prior to visit.   Allergies  Allergen Reactions  . Codeine Nausea And Vomiting   FH: see HPI + - HTN: in sister, mother - CVA: in MGM  PE: BP 112/64  Pulse 88  Temp(Src) 98.4 F (36.9 C) (Oral)  Resp 12  Ht 5\' 2"  (1.575 m)  Wt 103 lb (46.72 kg)  BMI 18.83 kg/m2  SpO2 97% Wt Readings from Last 3 Encounters:  08/11/13 103 lb (46.72 kg)    Constitutional: underweight, in NAD. No kyphosis. Eyes: PERRLA, EOMI, no exophthalmos ENT: moist mucous membranes, no thyromegaly, no cervical lymphadenopathy Cardiovascular: RRR, No MRG Respiratory: CTA B Gastrointestinal: abdomen soft, NT, ND, BS+ Musculoskeletal: no deformities, strength intact in all 4 Skin: moist, warm, no rashes Neurological: no tremor with outstretched hands, DTR +3/5 in all 4  Assessment: 1. Osteoporosis - 1 incidental vb fx: moderate wedge - L2 - s/p 20 years of Bisphosphonates - no SEs - on Ca + vit D - on Boniva now  Plan: 1. Osteoporosis - likely postmenopausal  - Discussed about increased risk of fracture, depending on the T score, greatly increased when the T score is lower than -2.5, but it is actually a continuum and -2.5 should not be regarded as an absolute threshold.  - We reviewed her DEXA scans together, and I explained that based on the T scores, she has an increased risk for fractures.  - We discussed about the different medication classes, benefits and side effects (including atypical fractures and ONJ - no dental workup in progress or planned). Since she has been on bisphosphonates for 20 years, I would suggest sq denosumab (Prolia) for at least 3-5 years, then maybe continue bisphosphonates. I don't think she is a candidate for Teriparatide at the moment. Pt was given reading information about Prolia, and I explained the mechanism of action and expected benefits.  - we reviewed her dietary and supplemental calcium and vitamin D intake, which I believe are adequate.  I advised her to continue this - given her specific instructions about food sources for these - see pt instructions  - discussed fall precautions  - given handout from [facility] Re: weight bearing exercises - advised to do this every day or at least 5/7 days - we discussed about different possible directions for management. We discussed about the benefits of  an alkaline diet, and I advised herto try to reduce the animal proteins, while maintaining a good amount of protein in her diet (needs about 50 mg a day - the recommended daily protein intake is ~0.8 g per kilogram per day). I advised her to try to aim for this amount, since a diet low in proteins can exacerbate osteoporosis. - We will check the following tests:  Vitamin D  CMP  TSH, free T4 and free T3  PTH - if all normal, will arrange for a Prolia inj >> start the PA form - will check a new DEXA scan in 2 years on Prolia - will see pt back in a year  - time spent with the patient: 1 hour, of which >50% was spent in obtaining information about her osteoporosis history, reviewing her previous DEXA scan reports along with the pt, previous treatments, counseling her about her condition (  please see the discussed topics above), and developing a plan to further investigate and treat. She had a number of questions which I addressed.  Component     Latest Ref Rng 08/11/2013  Sodium     135 - 145 mEq/L 135  Potassium     3.5 - 5.1 mEq/L 4.7  Chloride     96 - 112 mEq/L 98  CO2     19 - 32 mEq/L 31  Glucose     70 - 99 mg/dL 96  BUN     6 - 23 mg/dL 12  Creatinine     0.4 - 1.2 mg/dL 0.7  Calcium     8.4 - 10.5 mg/dL 9.7  GFR     >65.78>60.00 mL/min 91.86  TSH     0.35 - 4.50 uIU/mL 0.90  VITD      57.99  PTH     14.0 - 72.0 pg/mL 25.9  Free T4     0.60 - 1.60 ng/dL [id number]  T3, Free     2.3 - 4.2 pg/mL 2.3   Labs normal. Will start the PA form for Prolia.

## 2013-08-12 LAB — PARATHYROID HORMONE, INTACT (NO CA): PTH: 25.9 pg/mL (ref 14.0–72.0)

## 2013-08-16 ENCOUNTER — Other Ambulatory Visit (INDEPENDENT_AMBULATORY_CARE_PROVIDER_SITE_OTHER): Payer: 59

## 2013-08-16 ENCOUNTER — Ambulatory Visit (INDEPENDENT_AMBULATORY_CARE_PROVIDER_SITE_OTHER): Payer: 59 | Admitting: Internal Medicine

## 2013-08-16 ENCOUNTER — Encounter: Payer: Self-pay | Admitting: Internal Medicine

## 2013-08-16 ENCOUNTER — Encounter: Payer: Self-pay | Admitting: *Deleted

## 2013-08-16 VITALS — BP 110/70 | HR 76 | Temp 98.4°F | Resp 16 | Ht 62.0 in | Wt 104.0 lb

## 2013-08-16 DIAGNOSIS — F0390 Unspecified dementia without behavioral disturbance: Secondary | ICD-10-CM

## 2013-08-16 DIAGNOSIS — F341 Dysthymic disorder: Secondary | ICD-10-CM

## 2013-08-16 DIAGNOSIS — R279 Unspecified lack of coordination: Secondary | ICD-10-CM

## 2013-08-16 DIAGNOSIS — F068 Other specified mental disorders due to known physiological condition: Secondary | ICD-10-CM

## 2013-08-16 DIAGNOSIS — F418 Other specified anxiety disorders: Secondary | ICD-10-CM

## 2013-08-16 DIAGNOSIS — R270 Ataxia, unspecified: Secondary | ICD-10-CM

## 2013-08-16 LAB — CBC WITH DIFFERENTIAL/PLATELET
BASOS PCT: 0.8 % (ref 0.0–3.0)
Basophils Absolute: 0 10*3/uL (ref 0.0–0.1)
EOS ABS: 0.1 10*3/uL (ref 0.0–0.7)
EOS PCT: 1.7 % (ref 0.0–5.0)
HEMATOCRIT: 43.1 % (ref 36.0–46.0)
Hemoglobin: 14.4 g/dL (ref 12.0–15.0)
LYMPHS ABS: 1.1 10*3/uL (ref 0.7–4.0)
Lymphocytes Relative: 24.5 % (ref 12.0–46.0)
MCHC: 33.5 g/dL (ref 30.0–36.0)
MCV: 96.5 fl (ref 78.0–100.0)
MONO ABS: 0.7 10*3/uL (ref 0.1–1.0)
Monocytes Relative: 14.1 % — ABNORMAL HIGH (ref 3.0–12.0)
NEUTROS PCT: 58.9 % (ref 43.0–77.0)
Neutro Abs: 2.8 10*3/uL (ref 1.4–7.7)
PLATELETS: 219 10*3/uL (ref 150.0–400.0)
RBC: 4.46 Mil/uL (ref 3.87–5.11)
RDW: 15.2 % (ref 11.5–15.5)
WBC: 4.7 10*3/uL (ref 4.0–10.5)

## 2013-08-16 LAB — SEDIMENTATION RATE: Sed Rate: 5 mm/hr (ref 0–22)

## 2013-08-16 LAB — VITAMIN B12: Vitamin B-12: 874 pg/mL (ref 211–911)

## 2013-08-16 LAB — FOLATE: FOLATE: 18.2 ng/mL (ref >5.9)

## 2013-08-16 MED ORDER — VENLAFAXINE HCL ER 75 MG PO CP24: 75.0000 mg | ORAL_CAPSULE | Freq: Every day | ORAL | Status: DC

## 2013-08-16 NOTE — Patient Instructions (Signed)
Dementia Dementia is a general term for problems with brain function. A person with dementia has memory loss and a hard time with at least one other brain function such as thinking, speaking, or problem solving. Dementia can affect social functioning, how you do your job, your mood, or your personality. The changes may be hidden for a long time. The earliest forms of this disease are usually not detected by family or friends. Dementia can be:  Irreversible.  Potentially reversible.  Partially reversible.  Progressive. This means it can get worse over time. CAUSES  Irreversible dementia causes may include:  Degeneration of brain cells (Alzheimer's disease or lewy body dementia).  Multiple small strokes (vascular dementia).  Infection (chronic meningitis or Creutzfelt-Jakob disease).  Frontotemporal dementia. This affects younger people, age 40 to 70, compared to those who have Alzheimer's disease.  Dementia associated with other disorders like Parkinson's disease, Huntington's disease, or HIV-associated dementia. Potentially or partially reversible dementia causes may include:  Medicines.  Metabolic causes such as excessive alcohol intake, vitamin B12 deficiency, or thyroid disease.  Masses or pressure in the brain such as a tumor, blood clot, or hydrocephalus. SYMPTOMS  Symptoms are often hard to detect. Family members or coworkers may not notice them early in the disease process. Different people with dementia may have different symptoms. Symptoms can include:  A hard time with memory, especially recent memory. Long-term memory may not be impaired.  Asking the same question multiple times or forgetting something someone just said.  A hard time speaking your thoughts or finding certain words.  A hard time solving problems or performing familiar tasks (such as how to use a telephone).  Sudden changes in mood.  Changes in personality, especially increasing moodiness or  mistrust.  Depression.  A hard time understanding complex ideas that were never a problem in the past. DIAGNOSIS  There are no specific tests for dementia.   Your caregiver may recommend a thorough evaluation. This is because some forms of dementia can be reversible. The evaluation will likely include a physical exam and getting a detailed history from you and a family member. The history often gives the best clues and suggestions for a diagnosis.  Memory testing may be done. A detailed brain function evaluation called neuropsychologic testing may be helpful.  Lab tests and brain imaging (such as a CT scan or MRI scan) are sometimes important.  Sometimes observation and re-evaluation over time is very helpful. TREATMENT  Treatment depends on the cause.   If the problem is a vitamin deficiency, it may be helped or cured with supplements.  For dementias such as Alzheimer's disease, medicines are available to stabilize or slow the course of the disease. There are no cures for this type of dementia.  Your caregiver can help direct you to groups, organizations, and other caregivers to help with decisions in the care of you or your loved one. HOME CARE INSTRUCTIONS The care of individuals with dementia is varied and dependent upon the progression of the dementia. The following suggestions are intended for the person living with, or caring for, the person with dementia.  Create a safe environment.  Remove the locks on bathroom doors to prevent the person from accidentally locking himself or herself in.  Use childproof latches on kitchen cabinets and any place where cleaning supplies, chemicals, or alcohol are kept.  Use childproof covers in unused electrical outlets.  Install childproof devices to keep doors and windows secured.  Remove stove knobs or install safety   knobs and an automatic shut-off on the stove.  Lower the temperature on water heaters.  Label medicines and keep them  locked up.  Secure knives, lighters, matches, power tools, and guns, and keep these items out of reach.  Keep the house free from clutter. Remove rugs or anything that might contribute to a fall.  Remove objects that might break and hurt the person.  Make sure lighting is good, both inside and outside.  Install grab rails as needed.  Use a monitoring device to alert you to falls or other needs for help.  Reduce confusion.  Keep familiar objects and people around.  Use night lights or dim lights at night.  Label items or areas.  Use reminders, notes, or directions for daily activities or tasks.  Keep a simple, consistent routine for waking, meals, bathing, dressing, and bedtime.  Create a calm, quiet environment.  Place large clocks and calendars prominently.  Display emergency numbers and home address near all telephones.  Use cues to establish different times of the day. An example is to open curtains to let the natural light in during the day.   Use effective communication.  Choose simple words and short sentences.  Use a gentle, calm tone of voice.  Be careful not to interrupt.  If the person is struggling to find a word or communicate a thought, try to provide the word or thought.  Ask one question at a time. Allow the person ample time to answer questions. Repeat the question again if the person does not respond.  Reduce nighttime restlessness.  Provide a comfortable bed.  Have a consistent nighttime routine.  Ensure a regular walking or physical activity schedule. Involve the person in daily activities as much as possible.  Limit napping during the day.  Limit caffeine.  Attend social events that stimulate rather than overwhelm the senses.  Encourage good nutrition and hydration.  Reduce distractions during meal times and snacks.  Avoid foods that are too hot or too cold.  Monitor chewing and swallowing ability.  Continue with routine vision,  hearing, dental, and medical screenings.  Only give over-the-counter or prescription medicines as directed by the caregiver.  Monitor driving abilities. Do not allow the person to drive when safe driving is no longer possible.  Register with an identification program which could provide location assistance in the event of a missing person situation. SEEK MEDICAL CARE IF:   New behavioral problems start such as moodiness, aggressiveness, or seeing things that are not there (hallucinations).  Any new problem with brain function happens. This includes problems with balance, speech, or falling a lot.  Problems with swallowing develop.  Any symptoms of other illness happen. Small changes or worsening in any aspect of brain function can be a sign that the illness is getting worse. It can also be a sign of another medical illness such as infection. Seeing a caregiver right away is important. SEEK IMMEDIATE MEDICAL CARE IF:   A fever develops.  New or worsened confusion develops.  New or worsened sleepiness develops.  Staying awake becomes hard to do. Document Released: 07/23/2000 Document Revised: 04/21/2011 Document Reviewed: 06/24/2010 ExitCare Patient Information 2015 [employer]. This information is not intended to replace advice given to you by your health care provider. Make sure you discuss any questions you have with your health care provider.  

## 2013-08-16 NOTE — Progress Notes (Signed)
Pre visit review using our clinic review tool, if applicable. No additional management support is needed unless otherwise documented below in the visit note. 

## 2013-08-16 NOTE — Progress Notes (Signed)
Subjective:    Patient ID: Jeanne Lopez, female    DOB: 06/30/1932, 80 y.o.   MRN: [id number]  HPI Comments: New to me she comes in with her daughter and complains of decreased STM over the last year with an occasional fall (no injury) and feeling shaky, anxious, depressed, sad, generalized weakness, dizziness, confusion, and weight loss.  No old records available from [facility]     Review of Systems  Constitutional: Positive for activity change, appetite change and unexpected weight change. Negative for fever, chills, diaphoresis and fatigue.  Eyes: Negative.   Respiratory: Negative.  Negative for apnea, cough, choking, chest tightness, shortness of breath, wheezing and stridor.   Cardiovascular: Negative.  Negative for chest pain, palpitations and leg swelling.  Gastrointestinal: Negative.  Negative for nausea, vomiting, abdominal pain, diarrhea, constipation and blood in stool.  Endocrine: Negative.   Genitourinary: Negative.   Musculoskeletal: Negative.  Negative for arthralgias, back pain and myalgias.  Skin: Negative.  Negative for rash.  Allergic/Immunologic: Negative.   Neurological: Positive for dizziness, tremors, weakness and headaches. Negative for seizures, syncope, facial asymmetry, speech difficulty, light-headedness and numbness.  Hematological: Negative.  Negative for adenopathy. Does not bruise/bleed easily.  Psychiatric/Behavioral: Positive for confusion, sleep disturbance, dysphoric mood and decreased concentration. Negative for suicidal ideas, hallucinations, behavioral problems, self-injury and agitation. The patient is nervous/anxious. The patient is not hyperactive.        Objective:   Physical Exam  Vitals reviewed. Constitutional: She is oriented to person, place, and time. She appears well-developed and well-nourished. No distress.  HENT:  Head: Normocephalic and atraumatic.  Mouth/Throat: Oropharynx is clear and moist. No oropharyngeal  exudate.  Eyes: Conjunctivae and EOM are normal. Pupils are equal, round, and reactive to light. Right eye exhibits no discharge. Left eye exhibits no discharge. No scleral icterus.  Neck: Normal range of motion. Neck supple. No JVD present. No tracheal deviation present. No thyromegaly present.  Cardiovascular: Normal rate, regular rhythm, normal heart sounds and intact distal pulses.  Exam reveals no gallop and no friction rub.   No murmur heard. Pulmonary/Chest: Effort normal and breath sounds normal. No stridor. No respiratory distress. She has no wheezes. She has no rales. She exhibits no tenderness.  Abdominal: Soft. Bowel sounds are normal. She exhibits no distension and no mass. There is no tenderness. There is no rebound and no guarding.  Musculoskeletal: Normal range of motion. She exhibits no edema and no tenderness.  Lymphadenopathy:    She has no cervical adenopathy.  Neurological: She is alert and oriented to person, place, and time. She has normal strength. She displays no atrophy, no tremor and normal reflexes. No cranial nerve deficit or sensory deficit. She exhibits normal muscle tone. She displays a negative Romberg sign. She displays no seizure activity. Coordination and gait abnormal. She displays no Babinski's sign on the right side. She displays no Babinski's sign on the left side.  Reflex Scores:      Tricep reflexes are 1+ on the right side and 1+ on the left side.      Bicep reflexes are 1+ on the right side and 1+ on the left side.      Brachioradialis reflexes are 1+ on the right side and 1+ on the left side.      Patellar reflexes are 1+ on the right side and 1+ on the left side.      Achilles reflexes are 1+ on the right side and 1+ on the left  side. Some trouble with HTS but RAM with hands was good. No tremor noted. Slightly ataxic.  Skin: Skin is warm and dry. No rash noted. She is not diaphoretic. No erythema. No pallor.  Psychiatric: Judgment and thought content  normal. Her mood appears anxious. Her affect is not angry, not blunt, not labile and not inappropriate. Her speech is not rapid and/or pressured, not delayed, not tangential and not slurred. She is slowed and withdrawn. She is not agitated, not aggressive, not hyperactive, not actively hallucinating and not combative. Thought content is not paranoid and not delusional. Cognition and memory are impaired. She does not exhibit a depressed mood. She expresses no homicidal and no suicidal ideation. She expresses no suicidal plans and no homicidal plans. She is communicative. She exhibits abnormal remote memory.  MMSE   Date - 1/2 correct 3 objects at 5 mins - all correct President - correct Her DOB - correct She is inattentive.     Lab Results  Component Value Date   WBC 6.0 08/01/2012   HGB 12.3 08/01/2012   HCT 37.4 08/01/2012   PLT 145* 08/01/2012   GLUCOSE 96 08/11/2013   ALT 19 09/13/2008   AST 28 09/13/2008   NA 135 08/11/2013   K 4.7 08/11/2013   CL 98 08/11/2013   CREATININE 0.7 08/11/2013   BUN 12 08/11/2013   CO2 31 08/11/2013   TSH 0.90 08/11/2013       Assessment & Plan:

## 2013-08-16 NOTE — Telephone Encounter (Signed)
Sent pt's info to [employer] for insurance verification. Will notify you as soon as I have a response. Thank you. °

## 2013-08-17 LAB — RPR

## 2013-08-18 NOTE — Telephone Encounter (Signed)
Called pt and lvm advising [facility]. Asked the pt to return call so we can schedule for her to come in to have the Prolia injection (I need to order the Prolia).

## 2013-08-18 NOTE — Telephone Encounter (Signed)
I have received pt's info from [employer]. Her estimated cost with or without an office visit is a $35 copay, which will cover Prolia, administration, and office visit. Her co-pays contribute to a $2600 max (she's met $16.57). If you have any further questions, please let me know. Thank you.

## 2013-08-19 LAB — VITAMIN B1: VITAMIN B1 (THIAMINE): 21 nmol/L (ref 8–30)

## 2013-08-19 LAB — VITAMIN B6: Vitamin B6: 46.3 ng/mL — ABNORMAL HIGH (ref 2.1–21.7)

## 2013-08-19 NOTE — Assessment & Plan Note (Signed)
I have asked her to stop as many narcotics as possible - she will not stop lunesta and butalbital I will check her labs to look for secondary causes of cognitive decline Will check an MRI of brain to look for a structural cause

## 2013-08-19 NOTE — Assessment & Plan Note (Signed)
I have asked her to stop BZD's, she will not stop taking lunesta. I am concerned that the effexor may be too much NE for her so I have asked her to try to start to taper this. Will observe for s/s of depression and will address if needed - brentellix may be a better option for her

## 2013-08-19 NOTE — Assessment & Plan Note (Signed)
Will check her labs to look for causes of ataxia and other neuro s/s Will get a brain MRI done to look for mass, NPH, atrophy, CVA, demyelinating disorder, etc.

## 2013-08-20 ENCOUNTER — Encounter: Payer: Self-pay | Admitting: Internal Medicine

## 2013-08-20 LAB — ZINC: ZINC: 65 ug/dL (ref 60–130)

## 2013-08-23 ENCOUNTER — Encounter: Payer: Self-pay | Admitting: *Deleted

## 2013-08-23 NOTE — Telephone Encounter (Signed)
Pt did not return call. Letter sent to pt.

## 2013-08-30 ENCOUNTER — Telehealth: Payer: Self-pay | Admitting: Internal Medicine

## 2013-08-30 MED ORDER — ALPRAZOLAM 0.25 MG PO TABS: 0.2500 mg | ORAL_TABLET | Freq: Once | ORAL | Status: DC

## 2013-08-30 NOTE — Telephone Encounter (Signed)
Ok Xanax - call in pls Needs a driver Thx

## 2013-08-30 NOTE — Telephone Encounter (Signed)
MD is out of the office until 09/05/13, will route to another MD.

## 2013-08-30 NOTE — Telephone Encounter (Signed)
Patient called requesting medication to help her with the MRI she has scheduled for 09/06/13. Pt uses [facility].

## 2013-08-31 ENCOUNTER — Telehealth: Payer: Self-pay | Admitting: Internal Medicine

## 2013-08-31 NOTE — Telephone Encounter (Signed)
Rx called in and pt notified.

## 2013-09-06 ENCOUNTER — Ambulatory Visit
Admission: RE | Admit: 2013-09-06 | Discharge: 2013-09-06 | Disposition: A | Discharge status: Home / Self Care | Payer: 59 | Source: Ambulatory Visit | Attending: Internal Medicine | Admitting: Internal Medicine

## 2013-09-06 DIAGNOSIS — F0390 Unspecified dementia without behavioral disturbance: Secondary | ICD-10-CM

## 2013-09-06 DIAGNOSIS — R270 Ataxia, unspecified: Secondary | ICD-10-CM

## 2013-09-08 ENCOUNTER — Encounter: Payer: Self-pay | Admitting: Internal Medicine

## 2013-09-12 ENCOUNTER — Ambulatory Visit: Payer: 59

## 2013-09-15 ENCOUNTER — Ambulatory Visit (INDEPENDENT_AMBULATORY_CARE_PROVIDER_SITE_OTHER): Payer: 59 | Admitting: *Deleted

## 2013-09-15 ENCOUNTER — Other Ambulatory Visit: Payer: Self-pay | Admitting: *Deleted

## 2013-09-15 DIAGNOSIS — M810 Age-related osteoporosis without current pathological fracture: Secondary | ICD-10-CM

## 2013-09-15 MED ORDER — DENOSUMAB 60 MG/ML ~~LOC~~ SOLN
60.0000 mg | Freq: Once | SUBCUTANEOUS | Status: AC
  Administered 2013-09-15: 60 mg via SUBCUTANEOUS

## 2013-10-15 ENCOUNTER — Encounter: Payer: Self-pay | Admitting: *Deleted

## 2013-11-29 ENCOUNTER — Other Ambulatory Visit: Payer: Self-pay

## 2013-11-29 DIAGNOSIS — Z1239 Encounter for other screening for malignant neoplasm of breast: Secondary | ICD-10-CM

## 2013-12-30 ENCOUNTER — Other Ambulatory Visit: Payer: Self-pay

## 2013-12-30 DIAGNOSIS — Z1231 Encounter for screening mammogram for malignant neoplasm of breast: Secondary | ICD-10-CM

## 2014-01-03 ENCOUNTER — Ambulatory Visit
Admission: RE | Admit: 2014-01-03 | Discharge: 2014-01-03 | Disposition: A | Discharge status: Home / Self Care | Payer: Medicare Other | Source: Ambulatory Visit

## 2014-01-03 DIAGNOSIS — Z1231 Encounter for screening mammogram for malignant neoplasm of breast: Secondary | ICD-10-CM

## 2014-01-18 ENCOUNTER — Other Ambulatory Visit: Payer: Self-pay | Admitting: Neurology

## 2014-01-18 DIAGNOSIS — R0989 Other specified symptoms and signs involving the circulatory and respiratory systems: Secondary | ICD-10-CM

## 2014-01-24 ENCOUNTER — Encounter (INDEPENDENT_AMBULATORY_CARE_PROVIDER_SITE_OTHER): Payer: Self-pay

## 2014-01-24 ENCOUNTER — Ambulatory Visit
Admission: RE | Admit: 2014-01-24 | Discharge: 2014-01-24 | Disposition: A | Discharge status: Home / Self Care | Payer: Medicare Other | Source: Ambulatory Visit | Attending: Neurology | Admitting: Neurology

## 2014-01-24 DIAGNOSIS — R0989 Other specified symptoms and signs involving the circulatory and respiratory systems: Secondary | ICD-10-CM

## 2014-02-07 ENCOUNTER — Ambulatory Visit: Payer: Medicare Other | Admitting: Cardiology

## 2014-02-08 NOTE — Telephone Encounter (Signed)
error 

## 2014-02-16 ENCOUNTER — Other Ambulatory Visit: Payer: Self-pay | Admitting: Internal Medicine

## 2014-04-03 ENCOUNTER — Encounter: Payer: Self-pay | Admitting: Interventional Cardiology

## 2014-04-03 ENCOUNTER — Ambulatory Visit (INDEPENDENT_AMBULATORY_CARE_PROVIDER_SITE_OTHER): Payer: Medicare Other | Admitting: Interventional Cardiology

## 2014-04-03 ENCOUNTER — Ambulatory Visit: Payer: Medicare Other | Admitting: Interventional Cardiology

## 2014-04-03 VITALS — BP 100/64 | HR 75 | Ht 62.5 in | Wt 108.8 lb

## 2014-04-03 DIAGNOSIS — R011 Cardiac murmur, unspecified: Secondary | ICD-10-CM | POA: Insufficient documentation

## 2014-04-03 DIAGNOSIS — R270 Ataxia, unspecified: Secondary | ICD-10-CM

## 2014-04-03 NOTE — Progress Notes (Signed)
Patient ID: Jeanne Lopez, female   DOB: 06/03/1932, 81 y.o.   MRN: [id number]     Cardiology Office Note   Date:  04/03/2014   ID:  Jeanne Lopez, Jeanne Lopez 01-Jul-1932, MRN [id number]  PCP:  Sanda Linger, MD  Cardiologist:   Lesleigh Noe, MD   No chief complaint on file.     History of Present Illness: Jeanne Lopez is a 81 y.o. female who presents for evaluation of a systolic murmur. There is a long-standing history of recurrent syncope/falls. She is referred by Dr. Merri Brunette. She has some dyspnea on exertion. The history of fainting is difficult to separate from ataxia and losing her balance. She is accompanied by her granddaughter. She denies orthopnea and chest pain. An echocardiogram in 2010 revealed diastolic dysfunction but was otherwise normal.    Past Medical History  Diagnosis Date  . Osteoporosis   . Migraine   . Meniere's disease   . Depression   . Insomnia   . Anxiety   . Heart murmur     Past Surgical History  Procedure Laterality Date  . Appendectomy    . Dilation and curettage of uterus    . Breast lumpectomy Right      Current Outpatient Prescriptions  Medication Sig Dispense Refill  . Calcium Carbonate (CALCARB 600 PO) Take 600 mg by mouth 2 (two) times daily.    . divalproex (DEPAKOTE) 125 MG DR tablet Take 125 mg by mouth daily.    . Eszopiclone (ESZOPICLONE) 3 MG TABS Take 3 mg by mouth at bedtime. Take immediately before bedtime    . hyoscyamine (ANASPAZ) 0.125 MG TBDP disintergrating tablet Place 0.125 mg under the tongue every 4 (four) hours as needed.    . ibandronate (BONIVA) 150 MG tablet Take 150 mg by mouth every 30 (thirty) days. Take in the morning with a full glass of water, on an empty stomach, and do not take anything else by mouth or lie down for the next 30 min.    . mometasone (ELOCON) 0.1 % cream Apply 1 application topically daily.    . polyethylene glycol (MIRALAX / GLYCOLAX) packet Take 17 g by mouth daily.    . Probiotic  Product (PROBIOTIC DAILY PO) Take 1 capsule by mouth daily.    . Psyllium (METAMUCIL PO) Take by mouth.    . saccharomyces boulardii (FLORASTOR) 250 MG capsule Take 250 mg by mouth 2 (two) times daily.    . simethicone (MYLICON) 125 MG chewable tablet Chew 125 mg by mouth daily.    . SUMAtriptan Succinate (IMITREX PO) Take 1 tablet by mouth daily as needed (migraine).     No current facility-administered medications for this visit.    Allergies:   Codeine    Social History:  The patient  reports that she has never smoked. She has never used smokeless tobacco. She reports that she does not drink alcohol or use illicit drugs.   Family History:  The patient's family history includes Alcohol abuse in her daughter and son; CVA in her maternal grandmother; Cancer in her sister; Depression in her daughter and son; Drug abuse in her daughter and son; Heart attack in her mother; Hypertension in her brother, father, mother, and sister; Stroke in her father. There is no history of Kidney disease, Hyperlipidemia, Heart disease, Early death, or Arthritis.    ROS:  Please see the history of present illness.   Otherwise, review of systems are positive for has had syncope walking  dog and getting up in middle of night..   All other systems are reviewed and negative.    PHYSICAL EXAM: VS:  BP 100/64 mmHg  Pulse 75  Ht 5' 2.5" (1.588 m)  Wt 108 lb 12.8 oz (49.351 kg)  BMI 19.57 kg/m2 , BMI Body mass index is 19.57 kg/(m^2). GEN: Well nourished, well developed, in no acute distress HEENT: normal Neck: no JVD, carotid bruits, or masses Cardiac: RRR; 2/6 systolic murmur at the left midsternal border that increases in intensity with standing. Otherwise there are no rubs, or gallops,no edema  Respiratory:  clear to auscultation bilaterally, normal work of breathing GI: soft, nontender, nondistended, + BS MS: no deformity or atrophy Skin: warm and dry, no rash Neuro:  Strength and sensation are  intact Psych: euthymic mood, full affect   EKG:  EKG is ordered today. The ekg ordered today demonstrates NSR with LAHB.   Recent Labs: 08/11/2013: BUN 12; Creatinine 0.7; Potassium 4.7; Sodium 135; TSH 0.90 08/16/2013: Hemoglobin 14.4; Platelets 219.0    Lipid Panel No results found for: CHOL, TRIG, HDL, CHOLHDL, VLDL, LDLCALC, LDLDIRECT    Wt Readings from Last 3 Encounters:  04/03/14 108 lb 12.8 oz (49.351 kg)  08/16/13 104 lb (47.174 kg)  08/11/13 103 lb (46.72 kg)      Other studies Reviewed: Additional studies/ records that were reviewed today include: [facility] by Dr[facility] Merri Brunetteandace Smith. Review of the above records demonstrates: History of migraine headaches, osteoporosis, insomnia, anxiety disorder.    ASSESSMENT AND PLAN:  1.  Systolic murmur with increased intensity upon standing raising the question of LV outflow tract obstruction. We will therefore perform an echocardiogram to exclude hypertrophic cardiomyopathy in light of the EKG and history of? Fainting. My clinical exam suggested no significant pathology will be found. 2. ECG with left atrial abnormality, right atrial abnormality, and left anterior hemiblock. 3. Current falls/? Syncope   Current medicines are reviewed at length with the patient today.  The patient does not have concerns regarding medicines.  The following changes have been made:  no change  Labs/ tests ordered today include:   Orders Placed This Encounter  Procedures  . EKG 12-Lead  . 2D Echocardiogram without contrast     Disposition:   FU with Mendel RyderH. Smith in 12  months   Signed, Lesleigh NoeSMITH III,HENRY W, MD  04/03/2014 10:18 AM    [facility] [address], [place], [place]  [address] Phone: [phone]; Fax: [phone]

## 2014-04-03 NOTE — Patient Instructions (Signed)
Your physician recommends that you continue on your current medications as directed. Please refer to the Current Medication list given to you today.  Your physician has requested that you have an echocardiogram. Echocardiography is a painless test that uses sound waves to create images of your heart. It provides your doctor with information about the size and shape of your heart and how well your heart's chambers and valves are working. This procedure takes approximately one hour. There are no restrictions for this procedure.   Your physician recommends that you schedule a follow-up appointment as needed  

## 2014-04-11 ENCOUNTER — Other Ambulatory Visit (HOSPITAL_COMMUNITY): Payer: Medicare Other

## 2014-04-17 ENCOUNTER — Ambulatory Visit (HOSPITAL_COMMUNITY): Payer: Medicare Other | Attending: Interventional Cardiology | Admitting: Radiology

## 2014-04-17 DIAGNOSIS — R011 Cardiac murmur, unspecified: Secondary | ICD-10-CM | POA: Insufficient documentation

## 2014-04-17 NOTE — Progress Notes (Signed)
Echocardiogram performed.  

## 2014-04-21 ENCOUNTER — Telehealth: Payer: Self-pay

## 2014-04-21 NOTE — Telephone Encounter (Signed)
LVM for pt to call back.   RE: Flu vaccine for 2015/2016 season.  

## 2014-04-24 ENCOUNTER — Telehealth: Payer: Self-pay | Admitting: *Deleted

## 2014-04-24 NOTE — Telephone Encounter (Signed)
Rose, can you initiate a PA for Prolia for pt? Thank you.  

## 2014-04-25 ENCOUNTER — Telehealth: Payer: Self-pay | Admitting: Interventional Cardiology

## 2014-04-25 NOTE — Telephone Encounter (Signed)
Returned call to pt daughter. Left a message with pt son in law for [name] to call back. More info is needed for letter rqst

## 2014-04-25 NOTE — Telephone Encounter (Signed)
New Msg       Pt daughter called, Dr.Lewitt would need a fax sent [facility] at [phone].  Telephone [phone]. This is needed before pt can begin Imitrex.    Please call daughter [name] if any questions.

## 2014-04-25 NOTE — Telephone Encounter (Signed)
Patient daughter called back in to advise that patient did get a flu shot for 2015 in sept or oct.

## 2014-04-25 NOTE — Telephone Encounter (Signed)
Immunization recorded

## 2014-04-26 NOTE — Telephone Encounter (Signed)
F/u   Please call pt's daugher at [phone].

## 2014-04-26 NOTE — Telephone Encounter (Signed)
Not a good idea given the presence of IHSS. Could induce cardiac ischemia.

## 2014-04-26 NOTE — Telephone Encounter (Signed)
Pt daughter [name] aware of echo results. Adv her a copy of the echo results will be faxed to pcp Dr.Candace Smith Pt neurologist Dr.Lewitt is requesting clearance from Dr.Smith to prescribe  Imitrex for migraines adv her that Drmith is currently out of the office, I will fwd him a message and call Back with his response She will schedule pt f/u with Dr.H.Smith at that time

## 2014-04-27 NOTE — Telephone Encounter (Signed)
called to give pt daughter [name] Dr.Smith's response.lmtcb

## 2014-04-27 NOTE — Telephone Encounter (Signed)
I have sent pt's info for Prolia insurance verification and will notify you once I have a response. Thank you. °

## 2014-04-27 NOTE — Telephone Encounter (Signed)
Pt daughter [name] called back and is aware if Dr.Smith's recommendation Prescribing Imitrex is Not a good idea given the presence of IHSS. Could induce cardiac ischemia.  [name] phone dropped the call. Called her right back, her phone went straight to voicemail. lmtcb

## 2014-05-04 NOTE — Telephone Encounter (Signed)
I have rec'd pt's insurance verification for Prolia. There is a $50 co-pay for Prolia and whether an OV is billed or not there will also be a $20 co-pay to cover the admin (and OV if billed), which means the pt's estimated responsibility will be $70.  Please let pt know this is only an estimate and we will not know an exact amt until after the insurance has paid.  I have sent a copy of the summary of benefits to be scanned into her chart.  If Jeanne Lopez cannot afford $70 for her injection, then please advise her to contact [employer] at [phone] to see if she qualifies for one of their assistance programs.  If she qualifies they will instruct her how to proceed.  If you have any questions, please let me know.  Thank you.

## 2014-05-08 ENCOUNTER — Ambulatory Visit: Payer: Medicare Other | Admitting: Internal Medicine

## 2014-05-08 ENCOUNTER — Telehealth: Payer: Self-pay | Admitting: *Deleted

## 2014-05-08 NOTE — Telephone Encounter (Signed)
[facility] - Client TELEPHONE ADVICE RECORD [facility] Patient Name: Jeanne LeatherwoodLILA Rosa Gender: Female DOB: 09/24/1932 Age: 81 Y 6 M 20 D Return Phone Number: [phone] (Primary), [phone] (Secondary) Address: [place]/State/Zip: [place] Stat[facility] - Client Client Site [facility] - Night Physician Sanda LingerJones, Thomas Contact Type Call Caller Name Jeanne LeatherwoodLila Lopez Caller Phone Number [phone] Relationship To Patient Self Is this call to report lab results? No Call Type General Information Initial Comment Caller needs to cancel an appointment for Monday 05/08/2014 @ 2:45pm with Dr Marcello Mooresom Jones and will call back to reschedule. General Information Type Appointment Nurse Assessment Guidelines Guideline Title Affirmed Question Affirmed Notes Nurse Date/Time (Eastern Time) Disp. Time Jeanne Lopez(Eastern Time) Disposition Final User 05/05/2014 9:45:32 AM General Information Provided Yes Vivianne MasterKing-Hussey, Berdi After Care Instructions Given Call Event Type User Date / Time Description

## 2014-05-30 ENCOUNTER — Ambulatory Visit (INDEPENDENT_AMBULATORY_CARE_PROVIDER_SITE_OTHER): Payer: Medicare Other

## 2014-05-30 DIAGNOSIS — M810 Age-related osteoporosis without current pathological fracture: Secondary | ICD-10-CM | POA: Diagnosis not present

## 2014-05-30 MED ORDER — DENOSUMAB 60 MG/ML ~~LOC~~ SOLN
60.0000 mg | Freq: Once | SUBCUTANEOUS | Status: AC
  Administered 2014-05-30: 60 mg via SUBCUTANEOUS

## 2014-06-06 ENCOUNTER — Ambulatory Visit (INDEPENDENT_AMBULATORY_CARE_PROVIDER_SITE_OTHER): Payer: Medicare Other | Admitting: Internal Medicine

## 2014-06-06 VITALS — BP 122/80 | HR 80 | Temp 98.3°F | Resp 16 | Ht 62.5 in | Wt 106.0 lb

## 2014-06-06 DIAGNOSIS — K5909 Other constipation: Secondary | ICD-10-CM | POA: Insufficient documentation

## 2014-06-06 DIAGNOSIS — F418 Other specified anxiety disorders: Secondary | ICD-10-CM | POA: Diagnosis not present

## 2014-06-06 MED ORDER — TRAZODONE HCL 50 MG PO TABS: 50.0000 mg | ORAL_TABLET | Freq: Every day | ORAL | Status: DC

## 2014-06-06 MED ORDER — LINACLOTIDE 145 MCG PO CAPS: 145.0000 ug | ORAL_CAPSULE | Freq: Every day | ORAL | Status: DC

## 2014-06-06 NOTE — Patient Instructions (Signed)

## 2014-06-06 NOTE — Progress Notes (Signed)
Pre visit review using our clinic review tool, if applicable. No additional management support is needed unless otherwise documented below in the visit note. 

## 2014-06-06 NOTE — Progress Notes (Signed)
Subjective:    Patient ID: Jeanne Lopez, female    DOB: Oct 19, 1932, 81 y.o.   MRN: [id number]  HPI Comments: Her insurance co has recently refused to pay for generic lunesta and it is too expensive for her to buy it so she has been having more difficulty falling asleep and she complains of worsening anxiety.  Constipation This is a chronic problem. The current episode started more than 1 year ago. The problem is unchanged. Her stool frequency is 2 to 3 times per week. The stool is described as formed and firm. The patient is on a high fiber diet. She exercises regularly. There has been adequate water intake. Associated symptoms include bloating. Pertinent negatives include no abdominal pain, anorexia, back pain, diarrhea, difficulty urinating, fecal incontinence, fever, flatus, hematochezia, hemorrhoids, melena, nausea, rectal pain, vomiting or weight loss. She has tried stool softeners and laxatives for the symptoms. The treatment provided mild relief.      Review of Systems  Constitutional: Negative.  Negative for fever, chills, weight loss, diaphoresis, appetite change and fatigue.  HENT: Negative.   Eyes: Negative.   Respiratory: Negative.  Negative for cough, choking, chest tightness, shortness of breath and stridor.   Cardiovascular: Negative.  Negative for chest pain, palpitations and leg swelling.  Gastrointestinal: Positive for constipation and bloating. Negative for nausea, vomiting, abdominal pain, diarrhea, blood in stool, melena, hematochezia, rectal pain, anorexia, flatus and hemorrhoids.  Endocrine: Negative.   Genitourinary: Negative.  Negative for difficulty urinating.  Musculoskeletal: Negative.  Negative for back pain.  Skin: Negative.  Negative for rash.  Allergic/Immunologic: Negative.   Neurological: Negative.  Negative for dizziness, tremors, weakness, light-headedness and headaches.  Hematological: Negative.  Negative for adenopathy. Does not bruise/bleed easily.    Psychiatric/Behavioral: Positive for sleep disturbance, dysphoric mood and decreased concentration. Negative for suicidal ideas, hallucinations, confusion, self-injury and agitation. The patient is nervous/anxious. The patient is not hyperactive.        Objective:   Physical Exam  Constitutional: She is oriented to person, place, and time. She appears well-developed and well-nourished. No distress.  HENT:  Head: Normocephalic and atraumatic.  Mouth/Throat: Oropharynx is clear and moist. No oropharyngeal exudate.  Eyes: Conjunctivae are normal. Right eye exhibits no discharge. Left eye exhibits no discharge. No scleral icterus.  Neck: Normal range of motion. Neck supple. No JVD present. No tracheal deviation present. No thyromegaly present.  Cardiovascular: Normal rate, regular rhythm, normal heart sounds and intact distal pulses.  Exam reveals no gallop and no friction rub.   No murmur heard. Pulmonary/Chest: Effort normal and breath sounds normal. No stridor. No respiratory distress. She has no wheezes. She has no rales. She exhibits no tenderness.  Abdominal: Soft. Bowel sounds are normal. She exhibits no distension and no mass. There is no tenderness. There is no rebound and no guarding.  Musculoskeletal: Normal range of motion. She exhibits no edema or tenderness.  Lymphadenopathy:    She has no cervical adenopathy.  Neurological: She is oriented to person, place, and time.  Skin: Skin is warm and dry. No rash noted. She is not diaphoretic. No erythema. No pallor.  Psychiatric: Judgment normal. Her mood appears anxious. Her affect is not angry, not blunt, not labile and not inappropriate. Her speech is delayed. Her speech is not rapid and/or pressured, not tangential and not slurred. She is slowed. She is not agitated, not aggressive, not hyperactive, not withdrawn and not combative. Cognition and memory are normal. She exhibits a depressed mood.  She expresses no homicidal and no suicidal  ideation. She expresses no suicidal plans and no homicidal plans. She is communicative. She is attentive.  Vitals reviewed.    Lab Results  Component Value Date   WBC 4.7 08/16/2013   HGB 14.4 08/16/2013   HCT 43.1 08/16/2013   PLT 219.0 08/16/2013   GLUCOSE 96 08/11/2013   ALT 19 09/13/2008   AST 28 09/13/2008   NA 135 08/11/2013   K 4.7 08/11/2013   CL 98 08/11/2013   CREATININE 0.7 08/11/2013   BUN 12 08/11/2013   CO2 31 08/11/2013   TSH 0.90 08/11/2013       Assessment & Plan:

## 2014-06-07 ENCOUNTER — Encounter: Payer: Self-pay | Admitting: Internal Medicine

## 2014-06-07 NOTE — Assessment & Plan Note (Signed)
I think trazodone is a good option for her Will start at a low dose and will gradually increase the dose

## 2014-06-07 NOTE — Assessment & Plan Note (Signed)
I will check her labs to screen for secondary causes of constipation Will start linzess to help with the symptom

## 2014-06-09 ENCOUNTER — Encounter: Payer: Self-pay | Admitting: Internal Medicine

## 2014-06-09 ENCOUNTER — Ambulatory Visit (INDEPENDENT_AMBULATORY_CARE_PROVIDER_SITE_OTHER): Payer: Medicare Other | Admitting: Internal Medicine

## 2014-06-09 VITALS — BP 106/60 | HR 102 | Temp 98.2°F | Resp 12 | Wt 107.0 lb

## 2014-06-09 DIAGNOSIS — M810 Age-related osteoporosis without current pathological fracture: Secondary | ICD-10-CM | POA: Diagnosis not present

## 2014-06-09 LAB — BASIC METABOLIC PANEL
BUN: 22 mg/dL (ref 6–23)
CO2: 28 mEq/L (ref 19–32)
Calcium: 9.3 mg/dL (ref 8.4–10.5)
Chloride: 104 mEq/L (ref 96–112)
Creatinine, Ser: 0.79 mg/dL (ref 0.40–1.20)
GFR: 74.49 mL/min (ref >60.00)
GLUCOSE: 92 mg/dL (ref 70–99)
Potassium: 4 mEq/L (ref 3.5–5.1)
SODIUM: 136 meq/L (ref 135–145)

## 2014-06-09 LAB — VITAMIN D 25 HYDROXY (VIT D DEFICIENCY, FRACTURES): VITD: 34.91 ng/mL (ref 30.00–100.00)

## 2014-06-09 NOTE — Patient Instructions (Signed)
Please stop at the lab.  Please return in 1 year.  

## 2014-06-09 NOTE — Progress Notes (Signed)
Patient ID: Jeanne Lopez, female   DOB: 11/17/1932, 81 y.o.   MRN: [id number]   HPI  Jeanne Lopez is a 81 y.o.-year-old female, initially referred by Dr. Severiano Gilbert for f/u of osteoporosis. Last visit 10 mo ago.  She was recently started on Trazodone 50 >> then she increased the dose to 100 mg one night>> fell the next am. Now only on 50 mg nightly. She was previously on Lunesta.  Reviewed hx: Pt was dx with OP in her 65s. She denies any fractures, but had a vb fx found on VFA analysis in 01/2013: moderate wedge, L2. Reviewing her chart, she also had a R foot fx in 12/11/2000. She had a fall last summer (passed out), no fractures.  She also has dizziness/vertigo (Meniere's ds.) - this is controlled.  I reviewed pt's DEXA scans: Date L1-L4 (L2)T score RFN T score LFN T score 33% distal Radius  01/26/2013 [facility]) -3.2 -2.5 -3.0 -4.4  08/07/2008 ([facility]) L1-L4: -3.0 -3.0 (however, the femur not properly rotated, ROI not properly defined) -2.8 (however, the femur not properly rotated, ROI not properly defined)  n/a  12/16/2000 L1-L4: -3.7 Total Hip: -3.4    12/05/1998 L1-L4: -3.8 Total Hip: -3.3    05/18/1997 L1-L4: -3.7 Total Hip: -3.3     She has been on the following OP treatments:  - estrogen 1976-1986 - on Fosamax 1986-2003 - on Boniva 2004-2015 - On Prolia 09/15/2013, 05/30/2014  No h/o hyper/hypocalcemia. No h/o hyperparathyroidism. No h/o kidney stones. Lab Results  Component Value Date   PTH 25.9 08/11/2013   CALCIUM 9.7 08/11/2013   CALCIUM 8.7 09/13/2008   No h/o thyrotoxicosis. Reviewed TSH recent levels: Lab Results  Component Value Date   TSH 0.90 08/11/2013   TSH 1.529  09/13/2008   Last vitamin D level:  Component     Latest Ref Rng 08/11/2013  VITD      57.99   Pt is off her supplements: was taking calcium (500 mg 2x a day) and vitamin D (400 units 2x a day).  She does not eat much dairy but eats plenty of green, leafy, vegetables.    No h/o CKD. Last BUN/Cr: Lab Results  Component Value Date   BUN 12 08/11/2013   CREATININE 0.7 08/11/2013   No weight bearing exercises. She was doing Yoga 3x a week >> dizzy spells >> stopped.   She does not take high vitamin A doses.  ROS: Constitutional: no weight gain/loss, no fatigue, no subjective hyperthermia/hypothermia, + poor sleep Eyes: no blurry vision, no xerophthalmia ENT: no sore throat, no nodules palpated in throat, no dysphagia/odynophagia, no hoarseness Cardiovascular: no CP/SOB/palpitations/leg swelling Respiratory: + cough/no SOB Gastrointestinal: no N/V/D/+ C Musculoskeletal: no muscle/joint aches Skin: no rashes Neurological: no tremors/numbness/tingling/dizziness, + HAs (migraines)  Past Medical History  Diagnosis Date  . Osteoporosis   . Migraine   . Meniere's disease   . Depression   . Insomnia   . Anxiety   . Heart murmur    Past Surgical History  Procedure Laterality Date  . Appendectomy    . Dilation and curettage of uterus    . Breast lumpectomy Right    History   Social History  . Marital Status: Widowed    Spouse Name: N/A    Number of Children: 3   Social History Main Topics  . Smoking status: Never Smoker   . Smokeless tobacco: Not on file  . Alcohol Use: No  . Drug Use: No  Social History Narrative   Lives with daughter and dog. Grandchildren in frequently to help her.    Retired.    Current Outpatient Prescriptions on File Prior to Visit  Medication Sig Dispense Refill  . Calcium Carbonate (CALCARB 600 PO) Take 600 mg by mouth 2 (two) times daily.    . divalproex (DEPAKOTE) 125 MG DR tablet Take 125 mg by mouth daily.    . hyoscyamine (ANASPAZ) 0.125 MG TBDP disintergrating tablet Place 0.125 mg under the tongue every 4 (four) hours as needed.    . ibandronate (BONIVA) 150 MG tablet Take 150 mg by mouth every 30 (thirty) days. Take in the morning with a full glass of water, on an empty stomach, and do not take  anything else by mouth or lie down for the next 30 min.    . Linaclotide (LINZESS) 145 MCG CAPS capsule Take 1 capsule (145 mcg total) by mouth daily. 30 capsule 11  . mometasone (ELOCON) 0.1 % cream Apply 1 application topically daily.    . polyethylene glycol (MIRALAX / GLYCOLAX) packet Take 17 g by mouth daily.    . Probiotic Product (PROBIOTIC DAILY PO) Take 1 capsule by mouth daily.    . Psyllium (METAMUCIL PO) Take by mouth.    . saccharomyces boulardii (FLORASTOR) 250 MG capsule Take 250 mg by mouth 2 (two) times daily.    . simethicone (MYLICON) 125 MG chewable tablet Chew 125 mg by mouth daily.    . SUMAtriptan Succinate (IMITREX PO) Take 1 tablet by mouth daily as needed (migraine).    . traZODone (DESYREL) 50 MG tablet Take 1 tablet (50 mg total) by mouth at bedtime. 30 tablet 11   No current facility-administered medications on file prior to visit.   Allergies  Allergen Reactions  . Codeine Nausea And Vomiting   FH: see HPI + - HTN: in sister, mother - CVA: in MGM  PE: BP 106/60 mmHg  Pulse 102  Temp(Src) 98.2 F (36.8 C) (Oral)  Resp 12  Wt 107 lb (48.535 kg)  SpO2 95% Wt Readings from Last 3 Encounters:  06/09/14 107 lb (48.535 kg)  06/06/14 106 lb (48.081 kg)  04/03/14 108 lb 12.8 oz (49.351 kg)   Constitutional: underweight, in NAD. No kyphosis. Eyes: PERRLA, EOMI, no exophthalmos ENT: moist mucous membranes, no thyromegaly, no cervical lymphadenopathy Cardiovascular: RRR, No MRG Respiratory: CTA B Gastrointestinal: abdomen soft, NT, ND, BS+ Musculoskeletal: no deformities, strength intact in all 4 Skin: moist, warm, no rashes Neurological: + mild tremor with outstretched hands, DTR +3/5 in all 4  Assessment: 1. Osteoporosis - 1 incidental vb fx: moderate wedge - L2 - s/p 20 years of Bisphosphonates - no SEs - previously on Ca + vit D, now off - on Boniva previously, now on Prolia x 2 inj  Component     Latest Ref Rng 08/11/2013  Sodium     135 -  145 mEq/L 135  Potassium     3.5 - 5.1 mEq/L 4.7  Chloride     96 - 112 mEq/L 98  CO2     19 - 32 mEq/L 31  Glucose     70 - 99 mg/dL 96  BUN     6 - 23 mg/dL 12  Creatinine     0.4 - 1.2 mg/dL 0.7  Calcium     8.4 - 10.5 mg/dL 9.7  GFR     >16.10 mL/min 91.86  TSH     0.35 - 4.50 uIU/mL 0.90  VITD      57.99  PTH     14.0 - 72.0 pg/mL 25.9  Free T4     0.60 - 1.60 ng/dL 1.610.81  T3, Free     2.3 - 4.2 pg/mL 2.3    Plan: 1. Osteoporosis - likely postmenopausal  - tolerating Prolia well - had a recent fall w/o a fracture!  - discussed fall precaution measures  - We reviewed her DEXA scans together, and I explained that based on the T scores, she has an increased risk for fractures. However, Prolia is helping.  - now off Calcium and vitamin D >> advised her to get 1000-1200 mg calcium daily - will check Ca, GFR, vit D - I will let her know if she needs to add vitamin D supplement - advised her to start weight bearing exercises >> given exaples - We will check the following tests:  Vitamin D  BMP - continue Prolia x 2 more inj, then recheck DEXA scan  - will see pt back in a year  Component     Latest Ref Rng 06/09/2014  Sodium     135 - 145 mEq/L 136  Potassium     3.5 - 5.1 mEq/L 4.0  Chloride     96 - 112 mEq/L 104  CO2     19 - 32 mEq/L 28  Glucose     70 - 99 mg/dL 92  BUN     6 - 23 mg/dL 22  Creatinine     0.960.40 - 1.20 mg/dL 0.450.79  Calcium     8.4 - 10.5 mg/dL 9.3  GFR     >40.98>60.00 mL/min 74.49  VITD     30.00 - 100.00 ng/mL 34.91   Vit D normal, but decreased to the LLN. Will advise her to start 1000 units daily of vitamin D.

## 2014-06-14 ENCOUNTER — Encounter: Payer: Self-pay | Admitting: *Deleted

## 2014-07-12 ENCOUNTER — Encounter: Payer: Self-pay | Admitting: Internal Medicine

## 2014-07-12 ENCOUNTER — Ambulatory Visit (INDEPENDENT_AMBULATORY_CARE_PROVIDER_SITE_OTHER): Payer: Medicare Other | Admitting: Internal Medicine

## 2014-07-12 ENCOUNTER — Other Ambulatory Visit (INDEPENDENT_AMBULATORY_CARE_PROVIDER_SITE_OTHER): Payer: Medicare Other

## 2014-07-12 VITALS — BP 120/74 | HR 80 | Temp 98.0°F | Resp 16 | Ht 62.5 in | Wt 105.0 lb

## 2014-07-12 DIAGNOSIS — K5909 Other constipation: Secondary | ICD-10-CM

## 2014-07-12 DIAGNOSIS — E785 Hyperlipidemia, unspecified: Secondary | ICD-10-CM

## 2014-07-12 DIAGNOSIS — G43009 Migraine without aura, not intractable, without status migrainosus: Secondary | ICD-10-CM | POA: Diagnosis not present

## 2014-07-12 DIAGNOSIS — F418 Other specified anxiety disorders: Secondary | ICD-10-CM

## 2014-07-12 LAB — COMPREHENSIVE METABOLIC PANEL
ALT: 14 U/L (ref 0–35)
AST: 23 U/L (ref 0–37)
Albumin: 4.5 g/dL (ref 3.5–5.2)
Alkaline Phosphatase: 44 U/L (ref 39–117)
BUN: 18 mg/dL (ref 6–23)
CALCIUM: 9.8 mg/dL (ref 8.4–10.5)
CO2: 29 meq/L (ref 19–32)
Chloride: 102 mEq/L (ref 96–112)
Creatinine, Ser: 0.85 mg/dL (ref 0.40–1.20)
GFR: 68.44 mL/min (ref >60.00)
GLUCOSE: 98 mg/dL (ref 70–99)
Potassium: 5.4 mEq/L — ABNORMAL HIGH (ref 3.5–5.1)
Sodium: 137 mEq/L (ref 135–145)
TOTAL PROTEIN: 7.2 g/dL (ref 6.0–8.3)
Total Bilirubin: 0.4 mg/dL (ref 0.2–1.2)

## 2014-07-12 LAB — CBC WITH DIFFERENTIAL/PLATELET
Basophils Absolute: 0 10*3/uL (ref 0.0–0.1)
Basophils Relative: 0.5 % (ref 0.0–3.0)
EOS PCT: 2 % (ref 0.0–5.0)
Eosinophils Absolute: 0.1 10*3/uL (ref 0.0–0.7)
HCT: 41 % (ref 36.0–46.0)
HEMOGLOBIN: 13.5 g/dL (ref 12.0–15.0)
LYMPHS PCT: 23.2 % (ref 12.0–46.0)
Lymphs Abs: 1.2 10*3/uL (ref 0.7–4.0)
MCHC: 33.1 g/dL (ref 30.0–36.0)
MCV: 95.2 fl (ref 78.0–100.0)
MONOS PCT: 10.9 % (ref 3.0–12.0)
Monocytes Absolute: 0.6 10*3/uL (ref 0.1–1.0)
Neutro Abs: 3.3 10*3/uL (ref 1.4–7.7)
Neutrophils Relative %: 63.4 % (ref 43.0–77.0)
Platelets: 215 10*3/uL (ref 150.0–400.0)
RBC: 4.3 Mil/uL (ref 3.87–5.11)
RDW: 15.1 % (ref 11.5–15.5)
WBC: 5.3 10*3/uL (ref 4.0–10.5)

## 2014-07-12 LAB — LIPID PANEL
CHOLESTEROL: 242 mg/dL — AB (ref 0–200)
HDL: 78.8 mg/dL (ref >39.00)
LDL Cholesterol: 145 mg/dL — ABNORMAL HIGH (ref 0–99)
NONHDL: 163.2
TRIGLYCERIDES: 93 mg/dL (ref 0.0–149.0)
Total CHOL/HDL Ratio: 3
VLDL: 18.6 mg/dL (ref 0.0–40.0)

## 2014-07-12 LAB — T3, FREE: T3, Free: 2.8 pg/mL (ref 2.3–4.2)

## 2014-07-12 LAB — T4: T4, Total: 8.3 ug/dL (ref 4.5–12.0)

## 2014-07-12 LAB — TSH: TSH: 1.65 u[IU]/mL (ref 0.35–4.50)

## 2014-07-12 MED ORDER — TRAZODONE HCL 100 MG PO TABS: 200.0000 mg | ORAL_TABLET | Freq: Every day | ORAL | Status: DC

## 2014-07-12 MED ORDER — LUBIPROSTONE 8 MCG PO CAPS: 8.0000 ug | ORAL_CAPSULE | Freq: Two times a day (BID) | ORAL | Status: DC

## 2014-07-12 NOTE — Patient Instructions (Signed)

## 2014-07-12 NOTE — Progress Notes (Signed)
Pre visit review using our clinic review tool, if applicable. No additional management support is needed unless otherwise documented below in the visit note. 

## 2014-07-12 NOTE — Progress Notes (Signed)
Subjective:  Patient ID: Jeanne Lopez, female    DOB: December 13, 1932  Age: 81 y.o. MRN: [id number]  CC: Headache; Hyperlipidemia; and Depression   HPI Jeanne Lopez presents for follow, she has changed many of her meds at the direction of neurology. Her mood has improved on trazodone and she would like to try a higher dose to help with insomnia, anxiety, feeling shaky, and anhedonia. She continue to struggle with constipation and does not feel like linzess or miralax have helped.  Outpatient Prescriptions Prior to Visit  Medication Sig Dispense Refill  . ibandronate (BONIVA) 150 MG tablet Take 150 mg by mouth every 30 (thirty) days. Take in the morning with a full glass of water, on an empty stomach, and do not take anything else by mouth or lie down for the next 30 min.    . Probiotic Product (PROBIOTIC DAILY PO) Take 1 capsule by mouth daily.    . Psyllium (METAMUCIL PO) Take by mouth.    . saccharomyces boulardii (FLORASTOR) 250 MG capsule Take 250 mg by mouth 2 (two) times daily.    . Calcium Carbonate (CALCARB 600 PO) Take 600 mg by mouth 2 (two) times daily.    . divalproex (DEPAKOTE) 125 MG DR tablet Take 125 mg by mouth daily.    . hyoscyamine (ANASPAZ) 0.125 MG TBDP disintergrating tablet Place 0.125 mg under the tongue every 4 (four) hours as needed.    . Linaclotide (LINZESS) 145 MCG CAPS capsule Take 1 capsule (145 mcg total) by mouth daily. 30 capsule 11  . mometasone (ELOCON) 0.1 % cream Apply 1 application topically daily.    . polyethylene glycol (MIRALAX / GLYCOLAX) packet Take 17 g by mouth daily.    . simethicone (MYLICON) 125 MG chewable tablet Chew 125 mg by mouth daily.    . SUMAtriptan Succinate (IMITREX PO) Take 1 tablet by mouth daily as needed (migraine).    . traZODone (DESYREL) 50 MG tablet Take 1 tablet (50 mg total) by mouth at bedtime. 30 tablet 11   No facility-administered medications prior to visit.    ROS Review of Systems  Constitutional: Negative.   Negative for fever, chills, diaphoresis, activity change, appetite change, fatigue and unexpected weight change.  HENT: Negative.  Negative for trouble swallowing and voice change.   Eyes: Negative.   Respiratory: Negative.  Negative for cough, choking, chest tightness, shortness of breath and stridor.   Cardiovascular: Negative.  Negative for chest pain, palpitations and leg swelling.  Gastrointestinal: Positive for constipation. Negative for nausea, vomiting, abdominal pain, diarrhea and blood in stool.  Endocrine: Negative.   Genitourinary: Negative.   Musculoskeletal: Negative.   Allergic/Immunologic: Negative.   Neurological: Negative.   Hematological: Negative.  Negative for adenopathy. Does not bruise/bleed easily.  Psychiatric/Behavioral: Positive for sleep disturbance, dysphoric mood and decreased concentration. Negative for suicidal ideas, hallucinations, behavioral problems, confusion, self-injury and agitation. The patient is nervous/anxious. The patient is not hyperactive.     Objective:  BP 120/74 mmHg  Pulse 110  Temp(Src) 98 F (36.7 C) (Oral)  Resp 16  Ht 5' 2.5" (1.588 m)  Wt 105 lb (47.628 kg)  BMI 18.89 kg/m2  SpO2 97%  BP Readings from Last 3 Encounters:  07/12/14 120/74  06/09/14 106/60  06/06/14 122/80    Wt Readings from Last 3 Encounters:  07/12/14 105 lb (47.628 kg)  06/09/14 107 lb (48.535 kg)  06/06/14 106 lb (48.081 kg)    Physical Exam  Constitutional: She is oriented  to person, place, and time. She appears well-developed and well-nourished. No distress.  HENT:  Head: Normocephalic and atraumatic.  Mouth/Throat: Oropharynx is clear and moist. No oropharyngeal exudate.  Eyes: Conjunctivae are normal. Right eye exhibits no discharge. Left eye exhibits no discharge. No scleral icterus.  Neck: Normal range of motion. Neck supple. No JVD present. No tracheal deviation present. No thyromegaly present.  Cardiovascular: Normal rate, regular rhythm,  normal heart sounds and intact distal pulses.  Exam reveals no gallop and no friction rub.   No murmur heard. Pulmonary/Chest: Effort normal and breath sounds normal. No stridor. No respiratory distress. She has no wheezes. She has no rales. She exhibits no tenderness.  Abdominal: Soft. Bowel sounds are normal. She exhibits no distension. There is no tenderness. There is no rebound and no guarding.  Musculoskeletal: Normal range of motion. She exhibits no edema or tenderness.  Lymphadenopathy:    She has no cervical adenopathy.  Neurological: She is oriented to person, place, and time.  Skin: Skin is warm and dry. No rash noted. She is not diaphoretic. No erythema. No pallor.  Psychiatric: Judgment and thought content normal. Her mood appears anxious. Her affect is not angry, not blunt, not labile and not inappropriate. Her speech is not rapid and/or pressured, not delayed, not tangential and not slurred. She is not slowed, not withdrawn and not actively hallucinating. Cognition and memory are normal. She exhibits a depressed mood. She is communicative. She is attentive.  Vitals reviewed.   Lab Results  Component Value Date   WBC 5.3 07/12/2014   HGB 13.5 07/12/2014   HCT 41.0 07/12/2014   PLT 215.0 07/12/2014   GLUCOSE 98 07/12/2014   CHOL 242* 07/12/2014   TRIG 93.0 07/12/2014   HDL 78.80 07/12/2014   LDLCALC 145* 07/12/2014   ALT 14 07/12/2014   AST 23 07/12/2014   NA 137 07/12/2014   K 5.4* 07/12/2014   CL 102 07/12/2014   CREATININE 0.85 07/12/2014   BUN 18 07/12/2014   CO2 29 07/12/2014   TSH 1.65 07/12/2014    [place] Carotid Bilateral  01/24/2014   CLINICAL DATA:  81 year old female with dizziness  Cardiovascular risk factors are negative including diabetes and tobacco use.  EXAM: BILATERAL CAROTID DUPLEX ULTRASOUND  TECHNIQUE: Wallace CullensGray scale imaging, color Doppler and duplex ultrasound were performed of bilateral carotid and vertebral arteries in the neck.  COMPARISON:   01/31/2011  FINDINGS: Criteria: Quantification of carotid stenosis is based on velocity parameters that correlate the residual internal carotid diameter with NASCET-based stenosis levels, using the diameter of the distal internal carotid lumen as the denominator for stenosis measurement.  The following velocity measurements were obtained:  RIGHT  ICA:  Systolic 117 cm/sec, Diastolic 27 cm/sec  CCA:  110 cm/sec  SYSTOLIC ICA/CCA RATIO:  0.8  DIASTOLIC ICA/CCA RATIO:  1.1  ECA:  65 cm/sec  LEFT  ICA:  Systolic 100 cm/sec, Diastolic 36 cm/sec  CCA:  117 cm/sec  SYSTOLIC ICA/CCA RATIO:  0.9  DIASTOLIC ICA/CCA RATIO:  1.3  ECA:  83 cm/sec  RIGHT CAROTID ARTERY: Unremarkable appearance of the right common carotid artery. Intermediate waveform maintained.  Heterogeneous plaque at the distal right common carotid artery and bulb, with extension into the ICA. No significant calcifications. Low resistance waveform of the right ICA. No significant tortuosity.  RIGHT VERTEBRAL ARTERY: Antegrade flow with low resistance waveform.  LEFT CAROTID ARTERY: Unremarkable appearance of the left common carotid artery with mild heterogeneous plaque. No significant calcifications. Intermediate waveform  maintained.  Heterogeneous plaque at the left carotid bulb extending into the left ICA without significant calcifications. Low resistance waveform of the left ICA. No significant tortuosity.  LEFT VERTEBRAL ARTERY:  Antegrade flow with low resistance waveform.  IMPRESSION: Color duplex indicates minimal heterogeneous plaque, with no hemodynamically significant stenosis by duplex criteria in the extracranial cerebrovascular circulation.  Signed,  Yvone Neu. Loreta Ave, DO  [facility]  [facility]   Electronically Signed   By: Gilmer Mor D.O.   On: 01/24/2014 11:39    Assessment & Plan:   Chanee was seen today for headache, hyperlipidemia and depression.  Diagnoses and all orders for this  visit:  Other constipation - will check her labs to screen for secondary causes, will try amitiza since she does not like linzess and miralax Orders: -     Lipid panel; Future -     Comprehensive metabolic panel; Future -     TSH; Future -     T4; Future -     T3, free; Future -     CBC with Differential/Platelet; Future -     lubiprostone (AMITIZA) 8 MCG capsule; Take 1 capsule (8 mcg total) by mouth 2 (two) times daily with a meal.  Hyperlipidemia with target LDL less than 130 - Framingham risk score is 11%, she does not want to start a statin Orders: -     Lipid panel; Future -     Comprehensive metabolic panel; Future -     TSH; Future  Depression with anxiety -she has had a good response to trazodone, will increase the dose slowly Orders: -     traZODone (DESYREL) 100 MG tablet; Take 2 tablets (200 mg total) by mouth at bedtime.  Migraine without aura and without status migrainosus, not intractable - she sees Dr. Vela Prose about this, will increase the trazodone dose to see if that helps to prevent migraine headaches Orders: -     traZODone (DESYREL) 100 MG tablet; Take 2 tablets (200 mg total) by mouth at bedtime.   I have discontinued Ms. Habermann's SUMAtriptan Succinate (IMITREX PO), Calcium Carbonate (CALCARB 600 PO), divalproex, hyoscyamine, polyethylene glycol, simethicone, mometasone, Linaclotide, and traZODone. I am also having her start on traZODone and lubiprostone. Additionally, I am having her maintain her ibandronate, Probiotic Product (PROBIOTIC DAILY PO), Psyllium (METAMUCIL PO), and saccharomyces boulardii.  Meds ordered this encounter  Medications  . traZODone (DESYREL) 100 MG tablet    Sig: Take 2 tablets (200 mg total) by mouth at bedtime.    Dispense:  180 tablet    Refill:  3  . lubiprostone (AMITIZA) 8 MCG capsule    Sig: Take 1 capsule (8 mcg total) by mouth 2 (two) times daily with a meal.    Dispense:  180 capsule    Refill:  3     Follow-up:  Return in about 4 months (around 11/11/2014).  Sanda Linger, MD

## 2014-07-13 ENCOUNTER — Encounter: Payer: Self-pay | Admitting: Internal Medicine

## 2014-07-19 ENCOUNTER — Telehealth: Payer: Self-pay | Admitting: Internal Medicine

## 2014-07-19 DIAGNOSIS — K5909 Other constipation: Secondary | ICD-10-CM

## 2014-07-19 MED ORDER — LUBIPROSTONE 24 MCG PO CAPS: 24.0000 ug | ORAL_CAPSULE | Freq: Two times a day (BID) | ORAL | Status: DC

## 2014-07-19 NOTE — Telephone Encounter (Signed)
Patient was prescribed lubiprostone (AMITIZA) 8 MCG capsule [[id number] and it doesn't seem to be working and wondering if there is anything else. Please advise patient

## 2014-07-19 NOTE — Telephone Encounter (Signed)
Higher dose Rx sent to her pharmacy

## 2014-07-20 NOTE — Telephone Encounter (Signed)
Patient advised of dr Yetta Barre note

## 2014-08-11 ENCOUNTER — Ambulatory Visit (INDEPENDENT_AMBULATORY_CARE_PROVIDER_SITE_OTHER): Payer: Medicare Other | Admitting: Family Medicine

## 2014-08-11 ENCOUNTER — Ambulatory Visit (INDEPENDENT_AMBULATORY_CARE_PROVIDER_SITE_OTHER): Payer: Medicare Other

## 2014-08-11 VITALS — BP 120/78 | HR 89 | Temp 98.1°F | Resp 18 | Ht 62.0 in | Wt 103.0 lb

## 2014-08-11 DIAGNOSIS — R05 Cough: Secondary | ICD-10-CM | POA: Diagnosis not present

## 2014-08-11 DIAGNOSIS — R059 Cough, unspecified: Secondary | ICD-10-CM

## 2014-08-11 DIAGNOSIS — K5900 Constipation, unspecified: Secondary | ICD-10-CM

## 2014-08-11 DIAGNOSIS — K5909 Other constipation: Secondary | ICD-10-CM | POA: Insufficient documentation

## 2014-08-11 NOTE — Patient Instructions (Signed)
Try taking more doses of miralax- you can take 3 or 4 doses a day until effect You might also try some glycerin suppositories to lubricate stool If these measures do not work try magnesium citrate and/ or a stool softener such as magnesium citrate

## 2014-08-11 NOTE — Progress Notes (Signed)
[facility] [address], [place] [address] [id number] [phone]  Date:  08/11/2014   Name:  Jeanne Lopez   DOB:  1932-08-06   MRN:  [id number]  PCP:  Sanda Linger, MD    Chief Complaint: Constipation   History of Present Illness:  Jeanne Lopez is a 81 y.o. very pleasant female patient who presents with the following:  She is here today as a new pt with complaint of consitpaion for 5 days.  She called the nurse at her PCP office.  They are closed today, and the nurse there told her to come to [facility].  So far she has tried miralax up to 2 doses a day, and she is on Amitiza chronically.  Admits that constipation has been an issue for many years.   She has tried an enema today She has some belly discomfort and bloating but no pain No vomiting She is eating some- not great but she still has an appetite She has not no stools whatsoever for the last several days Here today with her daughter  She has also noted a cough for about one week- no fever.  Would like to have a CXR as long as we are taking x-rays Patient Active Problem List   Diagnosis Date Noted  . Hyperlipidemia with target LDL less than 130 07/12/2014  . Migraine without aura and without status migrainosus, not intractable 07/12/2014  . Other constipation 06/06/2014  . Systolic murmur 04/03/2014  . Dementia arising in the senium and presenium 08/16/2013  . Depression with anxiety 08/16/2013  . Osteoporosis 08/11/2013    Past Medical History  Diagnosis Date  . Osteoporosis   . Migraine   . Meniere's disease   . Depression   . Insomnia   . Anxiety   . Heart murmur     Past Surgical History  Procedure Laterality Date  . Appendectomy    . Dilation and curettage of uterus    . Breast lumpectomy Right     History  Substance Use Topics  . Smoking status: Never Smoker   . Smokeless tobacco: Never Used  . Alcohol Use: No    Family History  Problem Relation Age of Onset  . Hypertension Brother    . Cancer Sister   . Hypertension Sister   . Hypertension Mother   . Heart attack Mother   . Hypertension Father   . Stroke Father   . Alcohol abuse Daughter   . Depression Daughter   . Drug abuse Daughter   . Alcohol abuse Son   . Depression Son   . Drug abuse Son   . Kidney disease Neg Hx   . Hyperlipidemia Neg Hx   . Heart disease Neg Hx   . Early death Neg Hx   . Arthritis Neg Hx   . CVA Maternal Grandmother     Allergies  Allergen Reactions  . Codeine Nausea And Vomiting    Medication list has been reviewed and updated.  Current Outpatient Prescriptions on File Prior to Visit  Medication Sig Dispense Refill  . ibandronate (BONIVA) 150 MG tablet Take 150 mg by mouth every 30 (thirty) days. Take in the morning with a full glass of water, on an empty stomach, and do not take anything else by mouth or lie down for the next 30 min.    . lubiprostone (AMITIZA) 24 MCG capsule Take 1 capsule (24 mcg total) by mouth 2 (two) times daily with a meal. 180  capsule 3  . Probiotic Product (PROBIOTIC DAILY PO) Take 1 capsule by mouth daily.    . Psyllium (METAMUCIL PO) Take by mouth.    . saccharomyces boulardii (FLORASTOR) 250 MG capsule Take 250 mg by mouth 2 (two) times daily.    . traZODone (DESYREL) 100 MG tablet Take 2 tablets (200 mg total) by mouth at bedtime. 180 tablet 3   No current facility-administered medications on file prior to visit.    Review of Systems:  As per HPI- otherwise negative.   Physical Examination: Filed Vitals:   08/11/14 1444  BP: 120/78  Pulse: 89  Temp: 98.1 F (36.7 C)  Resp: 18   Filed Vitals:   08/11/14 1444  Height: 5\' 2"  (1.575 m)  Weight: 103 lb (46.72 kg)   Body mass index is 18.83 kg/(m^2). Ideal Body Weight: Weight in (lb) to have BMI = 25: 136.4  GEN: WDWN, NAD, Non-toxic, A & O x 3, thin older lady who appears well HEENT: Atraumatic, Normocephalic. Neck supple. No masses, No LAD. Ears and Nose: No external  deformity. CV: RRR, No M/G/R. No JVD. No thrill. No extra heart sounds. PULM: CTA B, no wheezes, crackles, rhonchi. No retractions. No resp. distress. No accessory muscle use. ABD: S, NT. No rebound. No HSM.  Her belly is slightly distended, active bowel sounds.  EXTR: No c/c/e NEURO Normal gait.  PSYCH: Normally interactive. Conversant. Not depressed or anxious appearing.  Calm demeanor.  Rectal: normal exam, no stool palpated   [facility] reading (PRIMARY) by  Dr. Patsy Lageropland. abd 2v:large amount of stool in the rectum, diffuse air in colon Chest: negative  ABDOMEN - 2 VIEW  COMPARISON: None.  FINDINGS: The bowel gas pattern is normal. There is no evidence of free air. Extensive bowel content is identified throughout colon. No radio-opaque calculi or other significant radiographic abnormality is seen.  IMPRESSION: No bowel obstruction. Constipation.  CHEST 2 VIEW  COMPARISON: None.  FINDINGS: Normal cardiac silhouette. Lungs are hyperinflated. Chronic bronchitic markings. No effusion, infiltrate, pneumothorax. No acute osseous abnormality.  IMPRESSION: Hyperinflated lungs. No acute findings  Assessment and Plan: Constipation, unspecified constipation type - Plan: DG Abd 2 Views  Cough - Plan: DG Chest 2 View  Discussed strategies to resolve her constipation.  She is relieved that nothing else seems to be wrong encouraged her to up her use of miralax, and to try glycerin suppositories.  She will let me know if she is not feeling better  Signed Abbe AmsterdamJessica Copland, MD

## 2014-11-03 ENCOUNTER — Telehealth: Payer: Self-pay | Admitting: *Deleted

## 2014-11-03 NOTE — Telephone Encounter (Signed)
Rose, will you initiate a PA for Prolia for pt? Thank you.  

## 2014-11-13 NOTE — Telephone Encounter (Signed)
I have electronically submitted pt's info for Prolia insurance verification and will notify you once I have a response. Thank you. °

## 2014-11-20 NOTE — Telephone Encounter (Signed)
I have rec'd Ms. Jeanne Lopez's insurance verification for Prolia and her estimated responsibility will be $90 whether an OV is billed or not.  Please advise pt this is an estimate and we will not know an exact amt until her insurance has paid.  I have sent a copy of the summary of benefits to be scanned into her chart.  If Ms. Jeanne Lopez cannot afford $90 for her injection, please advise her to contact Prolia at [phone] and select option #1 to see if she qualifies for one of their assistance programs.  If she qualifies they will instruct her how to proceed.  If you have any questions, please let me know. Thank you.

## 2014-11-23 NOTE — Telephone Encounter (Signed)
Lvm advising pt of message below. Asked pt to call and schedule Prolia inj.

## 2014-11-30 ENCOUNTER — Other Ambulatory Visit (INDEPENDENT_AMBULATORY_CARE_PROVIDER_SITE_OTHER): Payer: Medicare Other | Admitting: *Deleted

## 2014-11-30 ENCOUNTER — Ambulatory Visit (INDEPENDENT_AMBULATORY_CARE_PROVIDER_SITE_OTHER): Payer: Medicare Other | Admitting: *Deleted

## 2014-11-30 DIAGNOSIS — M810 Age-related osteoporosis without current pathological fracture: Secondary | ICD-10-CM

## 2014-11-30 MED ORDER — DENOSUMAB 60 MG/ML ~~LOC~~ SOLN
60.0000 mg | Freq: Once | SUBCUTANEOUS | Status: AC
  Administered 2014-11-30: 60 mg via SUBCUTANEOUS

## 2014-12-12 ENCOUNTER — Other Ambulatory Visit: Payer: Self-pay | Admitting: Family Medicine

## 2014-12-12 ENCOUNTER — Other Ambulatory Visit: Payer: Self-pay

## 2014-12-12 ENCOUNTER — Ambulatory Visit
Admission: RE | Admit: 2014-12-12 | Discharge: 2014-12-12 | Disposition: A | Discharge status: Home / Self Care | Payer: Medicare Other | Source: Ambulatory Visit | Attending: Family Medicine | Admitting: Family Medicine

## 2014-12-12 DIAGNOSIS — Z1231 Encounter for screening mammogram for malignant neoplasm of breast: Secondary | ICD-10-CM

## 2014-12-12 DIAGNOSIS — M545 Low back pain: Secondary | ICD-10-CM

## 2015-01-12 ENCOUNTER — Ambulatory Visit
Admission: RE | Admit: 2015-01-12 | Discharge: 2015-01-12 | Disposition: A | Discharge status: Home / Self Care | Payer: Medicare Other | Source: Ambulatory Visit

## 2015-01-12 DIAGNOSIS — Z1231 Encounter for screening mammogram for malignant neoplasm of breast: Secondary | ICD-10-CM

## 2015-03-13 ENCOUNTER — Other Ambulatory Visit: Payer: Self-pay | Admitting: Internal Medicine

## 2015-03-14 ENCOUNTER — Other Ambulatory Visit: Payer: Self-pay | Admitting: Internal Medicine

## 2015-05-15 ENCOUNTER — Telehealth: Payer: Self-pay | Admitting: *Deleted

## 2015-05-15 NOTE — Telephone Encounter (Signed)
Rose, can you initiate a PA for a Prolia inj? Pt is due to have hers on 06/01/15. Thank you so much!!

## 2015-05-21 NOTE — Telephone Encounter (Signed)
I have electronically submitted pt's info for Prolia insurance verification and will notify you once I have a response. Thank you. °

## 2015-05-29 NOTE — Telephone Encounter (Signed)
I have rec'd [name]'s ins verification for Prolia.  She has an estimated responsibility of $90 whether an OV is billed or not.  Please make pt aware this is an estimate and we will not know an exact amt until insurance(s) has/have paid.  I have sent a copy of the summary of benefits to be scanned into pt's chart.    If pt cannot afford $90 for her injection, please advise her to contact [employer] at [phone] and select option #1 to see if she qualifies for one of their assistance programs.  If she qualifies they will instruct her how to proceed.  If you have any questions, please let me know. Thank you.  Once pt recs injection, please let me know actual injection date so I can update the Prolia portal.  If you have any questions, please let me know.

## 2015-06-07 ENCOUNTER — Telehealth: Payer: Self-pay | Admitting: Internal Medicine

## 2015-06-07 NOTE — Telephone Encounter (Signed)
PT is coming in for an appt on Monday to see Dr. Elvera LennoxGherghe and wanted to know if she will be getting her Prolia Injection

## 2015-06-07 NOTE — Telephone Encounter (Signed)
Called pt and lvm advising her that she can have her Prolia inj at her appt with Dr Elvera LennoxGherghe on Monday.

## 2015-06-11 ENCOUNTER — Ambulatory Visit: Admission: RE | Admit: 2015-06-11 | Payer: Medicare Other | Source: Ambulatory Visit

## 2015-06-11 ENCOUNTER — Other Ambulatory Visit (INDEPENDENT_AMBULATORY_CARE_PROVIDER_SITE_OTHER): Payer: Medicare Other

## 2015-06-11 ENCOUNTER — Other Ambulatory Visit (INDEPENDENT_AMBULATORY_CARE_PROVIDER_SITE_OTHER): Payer: Medicare Other | Admitting: *Deleted

## 2015-06-11 ENCOUNTER — Encounter: Payer: Self-pay | Admitting: Internal Medicine

## 2015-06-11 ENCOUNTER — Ambulatory Visit (INDEPENDENT_AMBULATORY_CARE_PROVIDER_SITE_OTHER): Payer: Medicare Other | Admitting: Internal Medicine

## 2015-06-11 VITALS — BP 112/60 | HR 79 | Temp 98.2°F | Resp 12 | Wt 106.0 lb

## 2015-06-11 DIAGNOSIS — M810 Age-related osteoporosis without current pathological fracture: Secondary | ICD-10-CM

## 2015-06-11 DIAGNOSIS — E2839 Other primary ovarian failure: Secondary | ICD-10-CM

## 2015-06-11 LAB — BASIC METABOLIC PANEL
BUN: 19 mg/dL (ref 6–23)
CALCIUM: 9.6 mg/dL (ref 8.4–10.5)
CO2: 31 meq/L (ref 19–32)
CREATININE: 0.78 mg/dL (ref 0.40–1.20)
Chloride: 103 mEq/L (ref 96–112)
GFR: 75.4 mL/min (ref >60.00)
GLUCOSE: 91 mg/dL (ref 70–99)
Potassium: 4.7 mEq/L (ref 3.5–5.1)
Sodium: 139 mEq/L (ref 135–145)

## 2015-06-11 LAB — VITAMIN D 25 HYDROXY (VIT D DEFICIENCY, FRACTURES): VITD: 48.51 ng/mL (ref 30.00–100.00)

## 2015-06-11 MED ORDER — DENOSUMAB 60 MG/ML ~~LOC~~ SOLN
60.0000 mg | Freq: Once | SUBCUTANEOUS | Status: AC
  Administered 2015-06-11: 60 mg via SUBCUTANEOUS

## 2015-06-11 NOTE — Progress Notes (Signed)
Patient ID: Jeanne Lopez, female   DOB: 06/12/1932, 82 y.o.   MRN: [id number]   HPI  Jeanne Lopez is a 82 y.o.-year-old female, initially referred by Dr. Severiano Gilbert for f/u of osteoporosis. Last visit 1 year ago.  No fractures or falls since last visit.  Reviewed and addended hx: Pt was dx with OP in her 21s. She denies any fractures, but had a vb fx found on VFA analysis in 01/2013: moderate wedge, L2. Reviewing her chart, she also had a R foot fx in 12/11/2000. She had a fall last summer (passed out), no fractures.  She also has dizziness/vertigo (Meniere's ds.) - this is controlled.  I reviewed pt's DEXA scans: Date L1-L4 (L2)T score RFN T score LFN T score 33% distal Radius  01/26/2013 [facility]) -3.2 -2.5 -3.0 -4.4  08/07/2008 ([facility]) L1-L4: -3.0 -3.0 (however, the femur not properly rotated, ROI not properly defined) -2.8 (however, the femur not properly rotated, ROI not properly defined)  n/a  12/16/2000 L1-L4: -3.7 Total Hip: -3.4    12/05/1998 L1-L4: -3.8 Total Hip: -3.3    05/18/1997 L1-L4: -3.7 Total Hip: -3.3     She has been on the following OP treatments:  - estrogen 1976-1986 - on Fosamax 1986-2003 - on Boniva 2004-2015 - On Prolia 09/15/2013, 05/30/2014, 11/2014, 06/11/2015  No h/o hyper/hypocalcemia. No h/o hyperparathyroidism. No h/o kidney stones. Lab Results  Component Value Date   PTH 25.9 08/11/2013   CALCIUM 9.8 07/12/2014   CALCIUM 9.3 06/09/2014   CALCIUM 9.7 08/11/2013   CALCIUM 8.7 09/13/2008   No h/o thyrotoxicosis. Reviewed TSH recent levels: Lab Results  Component Value Date   TSH 1.65 07/12/2014   Last vitamin D level:  Lab Results  Component Value Date   VD25OH 34.91 06/09/2014   VD25OH 57.99 08/11/2013   Pt is on vitamin D1000 units daily which was started after last OV.  She does not eat much dairy but eats plenty of green, leafy, vegetables.   No h/o CKD. Last BUN/Cr: Lab Results  Component Value Date   BUN  18 07/12/2014   CREATININE 0.85 07/12/2014   Doing Yoga 1x a week. Walking her dog daily.  She does not take high vitamin A doses.  ROS: Constitutional: no weight gain/loss, no fatigue, no subjective hyperthermia/hypothermia Eyes: no blurry vision, no xerophthalmia ENT: no sore throat, no nodules palpated in throat, no dysphagia/odynophagia, no hoarseness Cardiovascular: no CP/SOB/palpitations/leg swelling Respiratory: no cough/no SOB Gastrointestinal: no N/V/D/C Musculoskeletal: no muscle/joint aches Skin: no rashes Neurological: no tremors/numbness/tingling/+ dizziness  I reviewed pt's medications, allergies, PMH, social hx, family hx, and changes were documented in the history of present illness. Otherwise, unchanged from my initial visit note.  Past Medical History  Diagnosis Date  . Osteoporosis   . Migraine   . Meniere's disease   . Depression   . Insomnia   . Anxiety   . Heart murmur    Past Surgical History  Procedure Laterality Date  . Appendectomy    . Dilation and curettage of uterus    . Breast lumpectomy Right    History   Social History  . Marital Status: Widowed    Spouse Name: N/A    Number of Children: 3   Social History Main Topics  . Smoking status: Never Smoker   . Smokeless tobacco: Not on file  . Alcohol Use: No  . Drug Use: No   Social History Narrative   Lives with daughter and dog. Grandchildren  in frequently to help her.    Retired.    Current Outpatient Prescriptions on File Prior to Visit  Medication Sig Dispense Refill  . ibandronate (BONIVA) 150 MG tablet Take 150 mg by mouth every 30 (thirty) days. Take in the morning with a full glass of water, on an empty stomach, and do not take anything else by mouth or lie down for the next 30 min.    . lubiprostone (AMITIZA) 24 MCG capsule Take 1 capsule (24 mcg total) by mouth 2 (two) times daily with a meal. 180 capsule 3  . Probiotic Product (PROBIOTIC DAILY PO) Take 1 capsule by mouth  daily.    . Psyllium (METAMUCIL PO) Take by mouth.    . saccharomyces boulardii (FLORASTOR) 250 MG capsule Take 250 mg by mouth 2 (two) times daily.    . traZODone (DESYREL) 100 MG tablet Take 2 tablets (200 mg total) by mouth at bedtime. 180 tablet 3  . venlafaxine XR (EFFEXOR-XR) 75 MG 24 hr capsule TAKE 1 CAPSULE WITH BREAKFAST 90 capsule 1   No current facility-administered medications on file prior to visit.   Allergies  Allergen Reactions  . Codeine Nausea And Vomiting   FH: see HPI + - HTN: in sister, mother - CVA: in MGM  PE: BP 112/60 mmHg  Pulse 79  Temp(Src) 98.2 F (36.8 C) (Oral)  Resp 12  Wt 106 lb (48.081 kg)  SpO2 96% Body mass index is 19.38 kg/(m^2). Wt Readings from Last 3 Encounters:  06/11/15 106 lb (48.081 kg)  08/11/14 103 lb (46.72 kg)  07/12/14 105 lb (47.628 kg)   Constitutional: underweight, in NAD. No kyphosis. Eyes: PERRLA, EOMI, no exophthalmos ENT: moist mucous membranes, no thyromegaly, no cervical lymphadenopathy Cardiovascular: RRR, No MRG Respiratory: CTA B Gastrointestinal: abdomen soft, NT, ND, BS+ Musculoskeletal: no deformities, strength intact in all 4 Skin: moist, warm, no rashes Neurological: + mild tremor with outstretched hands, DTR +3/5 in all 4  Assessment: 1. Osteoporosis - 1 incidental vb fx: moderate wedge - L2 - s/p 20 years of Bisphosphonates - no SEs - on Boniva previously, now on Prolia x 2 inj  Plan: 1. Osteoporosis - likely postmenopausal  - tolerating Prolia well, had 4 inj including the one from today - We reviewed her DEXA scans together, and I explained that based on the T scores, she has an increased risk for fractures. However, Prolia is helping.  - no recent fall; before last visit, she had a fall w/o a fracture - again discussed fall precaution measures  - vit D was low normal at last visit >> advised her to get 1000 units vitamin D daily - start weight bearing exercises >> given new list of recs from  [facility] - We will check the following tests:  Vitamin D  BMP - continue Prolia x total of 6 years  - will recheck DEXA scan >> ordered - will see pt back in a year  Lab on 06/11/2015  Component Date Value Ref Range Status  . VITD 06/11/2015 48.51  30.00 - 100.00 ng/mL Final  . Sodium 06/11/2015 139  135 - 145 mEq/L Final  . Potassium 06/11/2015 4.7  3.5 - 5.1 mEq/L Final  . Chloride 06/11/2015 103  96 - 112 mEq/L Final  . CO2 06/11/2015 31  19 - 32 mEq/L Final  . Glucose, Bld 06/11/2015 91  70 - 99 mg/dL Final  . BUN 16/11/9602 19  6 - 23 mg/dL Final  . Creatinine, Ser 06/11/2015  0.78  0.40 - 1.20 mg/dL Final  . Calcium 62/13/086505/02/2015 9.6  8.4 - 10.5 mg/dL Final  . GFR 78/46/962905/02/2015 75.40  >60.00 mL/min Final   Normal labs.

## 2015-06-11 NOTE — Patient Instructions (Signed)
We will call you with the DEXA schedule.  Please go by [facility] office for labs.  Continue vitamin D 1000 units daily.  Start calcium 600 mg daily with meals.  Please return in 1 year.  Exercise for Strong Bones (from [employer]) There are two types of exercises that are important for building and maintaining bone density:  weight-bearing and muscle-strengthening exercises. Weight-bearing Exercises These exercises include activities that make you move against gravity while staying upright. Weight-bearing exercises can be high-impact or low-impact. High-impact weight-bearing exercises help build bones and keep them strong. If you have broken a bone due to osteoporosis or are at risk of breaking a bone, you may need to avoid high-impact exercises. If you're not sure, you should check with your healthcare provider. Examples of high-impact weight-bearing exercises are: . Dancing . Doing high-impact aerobics . Hiking . Jogging/running . Jumping Rope . Stair climbing . Tennis Low-impact weight-bearing exercises can also help keep bones strong and are a safe alternative if you cannot do high-impact exercises. Examples of low-impact weight-bearing exercises are: . Using elliptical training machines . Doing low-impact aerobics . Using stair-step machines . Fast walking on a treadmill or outside Muscle-Strengthening Exercises These exercises include activities where you move your body, a weight or some other resistance against gravity. They are also known as resistance exercises and include: . Lifting weights . Using elastic exercise bands . Using weight machines . Lifting your own body weight . Functional movements, such as standing and rising up on your toes Yoga and Pilates can also improve strength, balance and flexibility. However, certain positions may not be safe for people with osteoporosis or those at increased risk of broken bones. For example, exercises that have  you bend forward may increase the chance of breaking a bone in the spine. A physical therapist should be able to help you learn which exercises are safe and appropriate for you. Non-Impact Exercises Non-impact exercises can help you to improve balance, posture and how well you move in everyday activities. These exercises can also help to increase muscle strength and decrease the risk of falls and broken bones. Some of these exercises include: . Balance exercises that strengthen your legs and test your balance, such as Tai Chi, can decrease your risk of falls. . Posture exercises that improve your posture and reduce rounded or "sloping" shoulders can help you decrease the chance of breaking a bone, especially in the spine. . Functional exercises that improve how well you move can help you with everyday activities and decrease your chance of falling and breaking a bone. For example, if you have trouble getting up from a chair or climbing stairs, you should do these activities as exercises. A physical therapist can teach you balance, posture and functional exercises. Starting a New Exercise Program If you haven't exercised regularly for a while, check with your healthcare provider before beginning a new exercise program-particularly if you have health problems such as heart disease, diabetes or high blood pressure. If you're at high risk of breaking a bone, you should work with a physical therapist to develop a safe exercise program. Once you have your healthcare provider's approval, start slowly. If you've already broken bones in the spine because of osteoporosis, be very careful to avoid activities that require reaching down, bending forward, rapid twisting motions, heavy lifting and those that increase your chance of a fall. As you get started, your muscles may feel sore for a day or two after you exercise. If   soreness lasts longer, you may be working too hard and need to ease up. Exercises should be done  in a pain-free range of motion. How Much Exercise Do You Need? Weight-bearing exercises 30 minutes on most days of the week. Do a 30-minutesession or multiple sessions spread out throughout the day. The benefits to your bones are the same.   Muscle-strengthening exercises Two to three days per week. If you don't have much time for strengthening/resistance training, do small amounts at a time. You can do just one body part each day. For example do arms one day, legs the next and trunk the next. You can also spread these exercises out during your normal day.  Balance, posture and functional exercises Every day or as often as needed. You may want to focus on one area more than the others. If you have fallen or lose your balance, spend time doing balance exercises. If you are getting rounded shoulders, work more on posture exercises. If you have trouble climbing stairs or getting up from the couch, do more functional exercises. You can also perform these exercises at one time or spread them during your day. Work with a phyiscal therapist to learn the right exercises for you.     

## 2015-06-18 ENCOUNTER — Telehealth: Payer: Self-pay | Admitting: *Deleted

## 2015-06-18 NOTE — Telephone Encounter (Signed)
Dr Elvera LennoxGherghe, I called [facility]. They advised me that they only accept referrals and order from their physicians for bone density or x-rays. She advised that the patient call her PCP and request an order for the bone density to be done. I called Ms Judeth Porchunstall and lvm advising her of such. Be advised.

## 2015-08-13 ENCOUNTER — Emergency Department (HOSPITAL_COMMUNITY)
Admission: EM | Admit: 2015-08-13 | Discharge: 2015-08-13 | Disposition: A | Discharge status: Home / Self Care | Payer: Medicare Other | Attending: Emergency Medicine | Admitting: Emergency Medicine

## 2015-08-13 ENCOUNTER — Emergency Department (HOSPITAL_COMMUNITY): Payer: Medicare Other

## 2015-08-13 ENCOUNTER — Encounter (HOSPITAL_COMMUNITY): Payer: Self-pay | Admitting: Emergency Medicine

## 2015-08-13 DIAGNOSIS — R531 Weakness: Secondary | ICD-10-CM | POA: Diagnosis not present

## 2015-08-13 DIAGNOSIS — N390 Urinary tract infection, site not specified: Secondary | ICD-10-CM | POA: Diagnosis not present

## 2015-08-13 DIAGNOSIS — R55 Syncope and collapse: Secondary | ICD-10-CM | POA: Diagnosis present

## 2015-08-13 DIAGNOSIS — Z79899 Other long term (current) drug therapy: Secondary | ICD-10-CM | POA: Diagnosis not present

## 2015-08-13 LAB — BASIC METABOLIC PANEL
ANION GAP: 5 (ref 5–15)
BUN: 14 mg/dL (ref 6–20)
CALCIUM: 8.7 mg/dL — AB (ref 8.9–10.3)
CO2: 28 mmol/L (ref 22–32)
CREATININE: 0.81 mg/dL (ref 0.44–1.00)
Chloride: 105 mmol/L (ref 101–111)
GFR calc Af Amer: >60 mL/min (ref >60)
GLUCOSE: 103 mg/dL — AB (ref 65–99)
Potassium: 4 mmol/L (ref 3.5–5.1)
Sodium: 138 mmol/L (ref 135–145)

## 2015-08-13 LAB — URINALYSIS, ROUTINE W REFLEX MICROSCOPIC
BILIRUBIN URINE: NEGATIVE
Glucose, UA: NEGATIVE mg/dL
HGB URINE DIPSTICK: NEGATIVE
KETONES UR: NEGATIVE mg/dL
NITRITE: NEGATIVE
Protein, ur: NEGATIVE mg/dL
SPECIFIC GRAVITY, URINE: 1.015 (ref 1.005–1.030)
pH: 8 (ref 5.0–8.0)

## 2015-08-13 LAB — CBC
HCT: 39.7 % (ref 36.0–46.0)
HEMOGLOBIN: 12.7 g/dL (ref 12.0–15.0)
MCH: 31.1 pg (ref 26.0–34.0)
MCHC: 32 g/dL (ref 30.0–36.0)
MCV: 97.3 fL (ref 78.0–100.0)
Platelets: 168 10*3/uL (ref 150–400)
RBC: 4.08 MIL/uL (ref 3.87–5.11)
RDW: 14.4 % (ref 11.5–15.5)
WBC: 7 10*3/uL (ref 4.0–10.5)

## 2015-08-13 LAB — URINE MICROSCOPIC-ADD ON: RBC / HPF: NONE SEEN RBC/hpf (ref 0–5)

## 2015-08-13 LAB — I-STAT TROPONIN, ED
TROPONIN I, POC: 0.01 ng/mL (ref 0.00–0.08)
TROPONIN I, POC: 0.01 ng/mL (ref 0.00–0.08)

## 2015-08-13 MED ORDER — CEPHALEXIN 500 MG PO CAPS: 500.0000 mg | ORAL_CAPSULE | Freq: Two times a day (BID) | ORAL | Status: DC

## 2015-08-13 MED ORDER — SODIUM CHLORIDE 0.9 % IV BOLUS (SEPSIS)
1000.0000 mL | Freq: Once | INTRAVENOUS | Status: AC
  Administered 2015-08-13: 1000 mL via INTRAVENOUS

## 2015-08-13 MED ORDER — DEXTROSE 5 % IV SOLN
1.0000 g | Freq: Once | INTRAVENOUS | Status: AC
  Administered 2015-08-13: 1 g via INTRAVENOUS
  Filled 2015-08-13: qty 10

## 2015-08-13 MED ORDER — SODIUM CHLORIDE 0.9 % IV BOLUS (SEPSIS)
500.0000 mL | Freq: Once | INTRAVENOUS | Status: AC
  Administered 2015-08-13: 500 mL via INTRAVENOUS

## 2015-08-13 NOTE — ED Notes (Signed)
Pt walked to the restroom with assistance, Pt states she is weak in her knees and pt very shaky and leaning on this RN while walking. Pt denies any dizziness. Pt helped to bathroom and back into bed. PA Trixie DredgeEmily West made aware.

## 2015-08-13 NOTE — ED Notes (Signed)
Patient returned from X-ray 

## 2015-08-13 NOTE — Discharge Instructions (Signed)
Read the information below.  Use the prescribed medication as directed.  Please discuss all new medications with your pharmacist.  You may return to the Emergency Department at any time for worsening condition or any new symptoms that concern you.      Near-Syncope Near-syncope (commonly known as near fainting) is sudden weakness, dizziness, or feeling like you might pass out. During an episode of near-syncope, you may also develop pale skin, have tunnel vision, or feel sick to your stomach (nauseous). Near-syncope may occur when getting up after sitting or while standing for a long time. It is caused by a sudden decrease in blood flow to the brain. This decrease can result from various causes or triggers, most of which are not serious. However, because near-syncope can sometimes be a sign of something serious, a medical evaluation is required. The specific cause is often not determined. HOME CARE INSTRUCTIONS  Monitor your condition for any changes. The following actions may help to alleviate any discomfort you are experiencing:  Have someone stay with you until you feel stable.  Lie down right away and prop your feet up if you start feeling like you might faint. Breathe deeply and steadily. Wait until all the symptoms have passed. Most of these episodes last only a few minutes. You may feel tired for several hours.   Drink enough fluids to keep your urine clear or pale yellow.   If you are taking blood pressure or heart medicine, get up slowly when seated or lying down. Take several minutes to sit and then stand. This can reduce dizziness.  Follow up with your health care provider as directed. SEEK IMMEDIATE MEDICAL CARE IF:   You have a severe headache.   You have unusual pain in the chest, abdomen, or back.   You are bleeding from the mouth or rectum, or you have black or tarry stool.   You have an irregular or very fast heartbeat.   You have repeated fainting or have  seizure-like jerking during an episode.   You faint when sitting or lying down.   You have confusion.   You have difficulty walking.   You have severe weakness.   You have vision problems.  MAKE SURE YOU:   Understand these instructions.  Will watch your condition.  Will get help right away if you are not doing well or get worse.   This information is not intended to replace advice given to you by your health care provider. Make sure you discuss any questions you have with your health care provider.   Document Released: 01/27/2005 Document Revised: 02/01/2013 Document Reviewed: 07/02/2012 Elsevier Interactive Patient Education 2016 Elsevier Inc.  Urinary Tract Infection Urinary tract infections (UTIs) can develop anywhere along your urinary tract. Your urinary tract is your body's drainage system for removing wastes and extra water. Your urinary tract includes two kidneys, two ureters, a bladder, and a urethra. Your kidneys are a pair of bean-shaped organs. Each kidney is about the size of your fist. They are located below your ribs, one on each side of your spine. CAUSES Infections are caused by microbes, which are microscopic organisms, including fungi, viruses, and bacteria. These organisms are so small that they can only be seen through a microscope. Bacteria are the microbes that most commonly cause UTIs. SYMPTOMS  Symptoms of UTIs may vary by age and gender of the patient and by the location of the infection. Symptoms in young women typically include a frequent and intense urge to  urinate and a painful, burning feeling in the bladder or urethra during urination. Older women and men are more likely to be tired, shaky, and weak and have muscle aches and abdominal pain. A fever may mean the infection is in your kidneys. Other symptoms of a kidney infection include pain in your back or sides below the ribs, nausea, and vomiting. DIAGNOSIS To diagnose a UTI, your caregiver will  ask you about your symptoms. Your caregiver will also ask you to provide a urine sample. The urine sample will be tested for bacteria and white blood cells. White blood cells are made by your body to help fight infection. TREATMENT  Typically, UTIs can be treated with medication. Because most UTIs are caused by a bacterial infection, they usually can be treated with the use of antibiotics. The choice of antibiotic and length of treatment depend on your symptoms and the type of bacteria causing your infection. HOME CARE INSTRUCTIONS  If you were prescribed antibiotics, take them exactly as your caregiver instructs you. Finish the medication even if you feel better after you have only taken some of the medication.  Drink enough water and fluids to keep your urine clear or pale yellow.  Avoid caffeine, tea, and carbonated beverages. They tend to irritate your bladder.  Empty your bladder often. Avoid holding urine for long periods of time.  Empty your bladder before and after sexual intercourse.  After a bowel movement, women should cleanse from front to back. Use each tissue only once. SEEK MEDICAL CARE IF:   You have back pain.  You develop a fever.  Your symptoms do not begin to resolve within 3 days. SEEK IMMEDIATE MEDICAL CARE IF:   You have severe back pain or lower abdominal pain.  You develop chills.  You have nausea or vomiting.  You have continued burning or discomfort with urination. MAKE SURE YOU:   Understand these instructions.  Will watch your condition.  Will get help right away if you are not doing well or get worse.   This information is not intended to replace advice given to you by your health care provider. Make sure you discuss any questions you have with your health care provider.   Document Released: 11/06/2004 Document Revised: 10/18/2014 Document Reviewed: 03/07/2011 Elsevier Interactive Patient Education [employer] Elsevier Inc.

## 2015-08-13 NOTE — ED Notes (Signed)
Patient transported to X-ray 

## 2015-08-13 NOTE — ED Provider Notes (Signed)
CSN: [id number]     Arrival date & time 08/13/15  0946 History   First MD Initiated Contact with Patient 08/13/15 [phone]     Chief Complaint  Patient presents with  . Near Syncope     (Consider location/radiation/quality/duration/timing/severity/associated sxs/prior Treatment) The history is provided by the patient and a relative.     Pt p/w episode of near syncope and generalized weakness that occurred this morning just before 9am.  States she went out to get the paper, came back inside and suddenly felt weak all over, shaky, nauseated, and lightheaded.  She laid down on the dining room floor and per family was having difficulty keeping her eyes open or communicating.  This lasted about 10 minutes.  She still feels generalized weakness but per family is back to baseline otherwise.  Per family pt had a similar episode once last summer.  Yesterday pt ate very little for dinner, went out to a music event, came home and took a longer-than-usual walk with her dog.  She had no symptoms yesterday.  Denies any pain at any time including chest pain, denies SOB, sweating, LOC.  Denies recent illness or vomiting, diarrhea.  No urinary symptoms.   Denies recent immobilization, exogenous estrogen, leg swelling, history of blood clots.     Past Medical History  Diagnosis Date  . Osteoporosis   . Migraine   . Meniere's disease   . Depression   . Insomnia   . Anxiety   . Heart murmur    Past Surgical History  Procedure Laterality Date  . Appendectomy    . Dilation and curettage of uterus    . Breast lumpectomy Right    Family History  Problem Relation Age of Onset  . Hypertension Brother   . Cancer Sister   . Hypertension Sister   . Hypertension Mother   . Heart attack Mother   . Hypertension Father   . Stroke Father   . Alcohol abuse Daughter   . Depression Daughter   . Drug abuse Daughter   . Alcohol abuse Son   . Depression Son   . Drug abuse Son   . Kidney disease Neg Hx   .  Hyperlipidemia Neg Hx   . Heart disease Neg Hx   . Early death Neg Hx   . Arthritis Neg Hx   . CVA Maternal Grandmother    Social History  Substance Use Topics  . Smoking status: Never Smoker   . Smokeless tobacco: Never Used  . Alcohol Use: No   OB History    No data available     Review of Systems  All other systems reviewed and are negative.     Allergies  Codeine  Home Medications   Prior to Admission medications   Medication Sig Start Date End Date Taking? Authorizing Provider  Probiotic Product (PROBIOTIC DAILY PO) Take 1 capsule by mouth daily.    Historical Provider, MD  saccharomyces boulardii (FLORASTOR) 250 MG capsule Take 250 mg by mouth 2 (two) times daily.    Historical Provider, MD  traZODone (DESYREL) 100 MG tablet Take 2 tablets (200 mg total) by mouth at bedtime. 07/12/14   Etta Grandchild, MD  venlafaxine XR (EFFEXOR-XR) 75 MG 24 hr capsule TAKE 1 CAPSULE WITH BREAKFAST 03/14/15   Etta Grandchild, MD   BP 124/55 mmHg  Pulse 67  Temp(Src) 98 F (36.7 C) (Oral)  Resp 16  Ht  (1.575 m)  Wt 48.081 kg  BMI 19.38  kg/m2  SpO2 100% Physical Exam  Constitutional: She appears well-developed and well-nourished. No distress.  Pt needed assistance to sit upright for exam  HENT:  Head: Normocephalic and atraumatic.  Neck: Neck supple.  Cardiovascular: Normal rate, regular rhythm and intact distal pulses.   Pulmonary/Chest: Effort normal and breath sounds normal. No respiratory distress. She has no wheezes. She has no rales.  Abdominal: Soft. She exhibits no distension. There is no tenderness. There is no rebound and no guarding.  Musculoskeletal: She exhibits no edema.  Neurological: She is alert. She exhibits normal muscle tone.  Skin: She is not diaphoretic.  Psychiatric: She has a normal mood and affect. Her behavior is normal.  Nursing note and vitals reviewed.   ED Course  Procedures (including critical care time) Labs Review Labs Reviewed   BASIC METABOLIC PANEL - Abnormal; Notable for the following:    Glucose, Bld 103 (*)    Calcium 8.7 (*)    All other components within normal limits  URINALYSIS, ROUTINE W REFLEX MICROSCOPIC (NOT AT [facility]) - Abnormal; Notable for the following:    APPearance HAZY (*)    Leukocytes, UA SMALL (*)    All other components within normal limits  URINE MICROSCOPIC-ADD ON - Abnormal; Notable for the following:    Squamous Epithelial / LPF 6-30 (*)    Bacteria, UA FEW (*)    Casts HYALINE CASTS (*)    All other components within normal limits  URINE CULTURE  CBC  CBG MONITORING, ED  Rosezena SensorI-STAT TROPOININ, ED    Imaging Review Dg Chest 2 View  08/13/2015  CLINICAL DATA:  Weakness and lightheadedness today. Initial encounter. EXAM: CHEST  2 VIEW COMPARISON:  PA and lateral chest 08/11/2014 and 09/13/2008. FINDINGS: The chest is somewhat hyperexpanded, unchanged. Mild biapical scar is also unchanged. No consolidative process, pneumothorax or effusion. Heart size is normal. No focal bony abnormality. IMPRESSION: No acute disease. Electronically Signed   By: Drusilla Kannerhomas  Dalessio M.D.   On: 08/13/2015 12:23   I have personally reviewed and evaluated these images and lab results as part of my medical decision-making.   EKG Interpretation   Date/Time:  Monday August 13 2015 09:49:15 EDT Ventricular Rate:  69 PR Interval:    QRS Duration: 100 QT Interval:  424 QTC Calculation: 455 R Axis:   -63 Text Interpretation:  Sinus rhythm Right atrial enlargement Left anterior  fascicular block Probable anteroseptal infarct, recent Confirmed by  PICKERING  MD, NATHAN [phone]([id number]) on 08/13/2015 12:51:25 PM       12:35 PM Pt feeling better.  Now states she has been having some urinary frequency and having to wake up at night to urinate.  Will treat for UTI.    3:09 PM Pt reports she is feeling much better.  She feels strong ambulating.  Ambulated around the room with her daughter and with me and was stable and needed no  assistance.  She and daughter are both comfortable and prefer discharge home.  Will follow up with PCP Dr Erby Pian. Smith.    MDM   Final diagnoses:  Generalized weakness  UTI (lower urinary tract infection)  Near syncope    Afebrile nontoxic patient with sudden episode of generalized weakness, lightheadedness, nausea, difficulty keeping eyes open and communicating.  Did have increased activity and decreased PO intake the day before.  No chest pain or SOB.  No known PE risk factors. Is having urinary symptoms and UA appears infected.  Rocephin and IVF given in ED with  great improvement.  Pt and family comfortable with and prefer discharge home.  D/C with keflex, close PCP follow up.  Discussed result, findings, treatment, and follow up  with patient.  Pt given return precautions.  Pt verbalizes understanding and agrees with plan.        Trixie Dredgemily Jerin Franzel, PA-C 08/13/15 1628  Benjiman CoreNathan Pickering, MD 08/14/15 [phone]  Benjiman CoreNathan Pickering, MD 09/25/15 1435

## 2015-08-13 NOTE — ED Notes (Signed)
Pt ambulated to rr

## 2015-08-13 NOTE — ED Notes (Signed)
Pt states she was walking inside her house from being outside around 0850 and when she got back into kitchen pt states that she became very dizzy and laid down because she felt like she was going to "pass out". Per ems pt was pale and had weak radial pulses on arrival. EMS gave pt 500 NS bolus on the way to ED and on arrival pt Denies any dizziness and appears pink warm and dry. Pt states she feels "weak" all over. Pt is alert and ox3.

## 2015-08-13 NOTE — ED Notes (Signed)
Family at bedside. 

## 2015-08-14 LAB — URINE CULTURE

## 2015-12-12 ENCOUNTER — Other Ambulatory Visit: Payer: Self-pay | Admitting: Family Medicine

## 2015-12-12 DIAGNOSIS — Z1231 Encounter for screening mammogram for malignant neoplasm of breast: Secondary | ICD-10-CM

## 2016-01-30 ENCOUNTER — Ambulatory Visit
Admission: RE | Admit: 2016-01-30 | Discharge: 2016-01-30 | Disposition: A | Discharge status: Home / Self Care | Payer: Medicare Other | Source: Ambulatory Visit | Attending: Family Medicine | Admitting: Family Medicine

## 2016-01-30 DIAGNOSIS — Z1231 Encounter for screening mammogram for malignant neoplasm of breast: Secondary | ICD-10-CM

## 2016-04-10 ENCOUNTER — Telehealth: Payer: Self-pay | Admitting: Internal Medicine

## 2016-04-10 ENCOUNTER — Telehealth: Payer: Self-pay

## 2016-04-10 NOTE — Telephone Encounter (Signed)
Patient is calling on the status of her prolia shot.

## 2016-04-10 NOTE — Telephone Encounter (Signed)
Called and notified patient that we were still waiting on response. Patient had no questions at this time.

## 2016-04-10 NOTE — Telephone Encounter (Signed)
Insurance benefits investigation form submitted. Waiting on response.

## 2016-04-10 NOTE — Telephone Encounter (Signed)
Called and spoke with patient advised that we are still waiting on response for Prolia, and we would let her know when we received any information.

## 2016-04-14 ENCOUNTER — Telehealth: Payer: Self-pay

## 2016-04-14 NOTE — Telephone Encounter (Signed)
Verification of benefits have been processed and an approval has been received for pts prolia injection. Pts estimated cost are appx $90. This is only an estimate and cannot be confirmed until benefits are paid. Please advise pt and schedule if needed. If scheduled, once the injection is received, pls contact me back with the date it was received so that I am able to update prolia folder. thanks  

## 2016-04-14 NOTE — Telephone Encounter (Signed)
Called and LVM advising of cost of prolia, advised patient to call back to schedule, gave call back number.

## 2016-04-14 NOTE — Telephone Encounter (Signed)
Called and LVM advising patient of cost of prolia injection. Advised patient to call back to schedule that appointment if desired, gave call back number.

## 2016-04-17 ENCOUNTER — Ambulatory Visit (INDEPENDENT_AMBULATORY_CARE_PROVIDER_SITE_OTHER): Payer: Medicare Other

## 2016-04-17 DIAGNOSIS — M810 Age-related osteoporosis without current pathological fracture: Secondary | ICD-10-CM

## 2016-04-17 MED ORDER — DENOSUMAB 60 MG/ML ~~LOC~~ SOLN
60.0000 mg | Freq: Once | SUBCUTANEOUS | Status: AC
  Administered 2016-04-17: 60 mg via SUBCUTANEOUS

## 2016-05-14 ENCOUNTER — Encounter: Payer: Self-pay | Admitting: Neurology

## 2016-06-10 ENCOUNTER — Ambulatory Visit: Payer: Medicare Other | Admitting: Internal Medicine

## 2016-07-08 ENCOUNTER — Ambulatory Visit (INDEPENDENT_AMBULATORY_CARE_PROVIDER_SITE_OTHER): Payer: Medicare Other | Admitting: Internal Medicine

## 2016-07-08 ENCOUNTER — Other Ambulatory Visit (INDEPENDENT_AMBULATORY_CARE_PROVIDER_SITE_OTHER): Payer: Medicare Other

## 2016-07-08 ENCOUNTER — Encounter: Payer: Self-pay | Admitting: Internal Medicine

## 2016-07-08 VITALS — BP 110/78 | HR 74 | Wt 108.0 lb

## 2016-07-08 DIAGNOSIS — M810 Age-related osteoporosis without current pathological fracture: Secondary | ICD-10-CM | POA: Diagnosis not present

## 2016-07-08 LAB — BASIC METABOLIC PANEL WITH GFR
BUN: 18 mg/dL (ref 7–25)
CALCIUM: 9.7 mg/dL (ref 8.6–10.4)
CO2: 23 mmol/L (ref 20–31)
Chloride: 102 mmol/L (ref 98–110)
Creat: 0.93 mg/dL — ABNORMAL HIGH (ref 0.60–0.88)
GFR, EST AFRICAN AMERICAN: 67 mL/min (ref >=60)
GFR, EST NON AFRICAN AMERICAN: 58 mL/min — AB (ref >=60)
Glucose, Bld: 82 mg/dL (ref 65–99)
Potassium: 4.7 mmol/L (ref 3.5–5.3)
SODIUM: 140 mmol/L (ref 135–146)

## 2016-07-08 LAB — VITAMIN D 25 HYDROXY (VIT D DEFICIENCY, FRACTURES): VITD: 39.3 ng/mL (ref 30.00–100.00)

## 2016-07-08 NOTE — Patient Instructions (Signed)
[facility].  Please restart vitamin D 1000 units daily.  Please return in 1 year.

## 2016-07-08 NOTE — Progress Notes (Signed)
Patient ID: Jeanne MassedLila A Kishimoto, female   DOB: 10/27/1932, 83 y.o.   MRN: [id number]   HPI  Jeanne Lopez is a 83 y.o.-year-old female, initially referred by Dr. Severiano Gilbertandice Smith for f/u of osteoporosis. Last visit 13 mo ago.  No fxs and falls since last visit.  Reviewed and addended hx: Pt was dx with OP in her 4250s. She denies any fractures, but had a vb fx found on VFA analysis in 01/2013: moderate wedge, L2. Reviewing her chart, she also had a R foot fx in 12/11/2000. She had a fall last summer (passed out), no fractures.  She also has dizziness/vertigo (Meniere's ds.) - controlled.  I reviewed pt's DEXA scans: Date L1-L4 (L2)T score RFN T score LFN T score 33% distal Radius  01/26/2013 [facility]) -3.2 -2.5 -3.0 -4.4  08/07/2008 ([facility]) L1-L4: -3.0 -3.0 (however, the femur not properly rotated, ROI not properly defined) -2.8 (however, the femur not properly rotated, ROI not properly defined)  n/a  12/16/2000 L1-L4: -3.7 Total Hip: -3.4    12/05/1998 L1-L4: -3.8 Total Hip: -3.3    05/18/1997 L1-L4: -3.7 Total Hip: -3.3     She has been on the following OP treatments:  - estrogen 1976-1986 - on Fosamax 1986-2003 - on Boniva 2004-2015 - On Prolia 09/15/2013, 05/30/2014, 11/2014, 06/11/2015, 04/17/2016  No h/o hyper/hypocalcemia. No h/o hyperparathyroidism. No h/o kidney stones. Lab Results  Component Value Date   PTH 25.9 08/11/2013   CALCIUM 8.7 (L) 08/13/2015   CALCIUM 9.6 06/11/2015   CALCIUM 9.8 07/12/2014   CALCIUM 9.3 06/09/2014   CALCIUM 9.7 08/11/2013   CALCIUM 8.7 09/13/2008   No h/o thyrotoxicosis. Reviewed TSH recent levels: Lab Results  Component Value Date   TSH 1.65 07/12/2014   Last vitamin D level:  Lab Results  Component Value Date   VD25OH 48.51 06/11/2015   VD25OH 34.91 06/09/2014   VD25OH 57.99 08/11/2013   Pt is on Vitamin D 1000 units daily >> however, lately, she forgot frequently. She takes calcium daily (500 mg?).  She eats plenty  of green leafy vegetables, but not much dairy.  No CKD. Last BUN/Cr: Lab Results  Component Value Date   BUN 14 08/13/2015   CREATININE 0.81 08/13/2015   She stopped Yoga 1x a week. Walking her dog daily.   ROS: Constitutional: no weight gain/no weight loss, no fatigue, no subjective hyperthermia, no subjective hypothermia Eyes: no blurry vision, no xerophthalmia ENT: no sore throat, no nodules palpated in throat, no dysphagia, no odynophagia, no hoarseness Cardiovascular: no CP/no SOB/no palpitations/no leg swelling Respiratory: no cough/no SOB/no wheezing Gastrointestinal: no N/no V/no D/no C/no acid reflux Musculoskeletal: no muscle aches/no joint aches Skin: no rashes, no hair loss Neurological: no tremors/no numbness/no tingling/no dizziness  I reviewed pt's medications, allergies, PMH, social hx, family hx, and changes were documented in the history of present illness. Otherwise, unchanged from my initial visit note.   Past Medical History:  Diagnosis Date  . Anxiety   . Depression   . Heart murmur   . Insomnia   . Meniere's disease   . Migraine   . Osteoporosis    Past Surgical History:  Procedure Laterality Date  . APPENDECTOMY    . BREAST LUMPECTOMY Right   . DILATION AND CURETTAGE OF UTERUS     History   Social History  . Marital Status: Widowed    Spouse Name: N/A    Number of Children: 3   Social History Main Topics  .  Smoking status: Never Smoker   . Smokeless tobacco: Not on file  . Alcohol Use: No  . Drug Use: No   Social History Narrative   Lives with daughter and dog. Grandchildren in frequently to help her.    Retired.    Current Outpatient Prescriptions on File Prior to Visit  Medication Sig Dispense Refill  . aspirin-acetaminophen-caffeine (EXCEDRIN MIGRAINE) 250-250-65 MG tablet Take 1 tablet by mouth every 6 (six) hours as needed for headache.    . traZODone (DESYREL) 100 MG tablet Take 2 tablets (200 mg total) by mouth at bedtime.  180 tablet 3  . venlafaxine XR (EFFEXOR-XR) 150 MG 24 hr capsule Take 150 mg by mouth daily with breakfast.     No current facility-administered medications on file prior to visit.    Allergies  Allergen Reactions  . Codeine Nausea And Vomiting   FH: see HPI + - HTN: in sister, mother - CVA: in MGM  PE: BP 110/78 (BP Location: Left Arm, Patient Position: Sitting)   Pulse 74   Wt 108 lb (49 kg)   SpO2 96%   BMI 19.75 kg/m  Body mass index is 19.75 kg/m. Wt Readings from Last 3 Encounters:  07/08/16 108 lb (49 kg)  08/13/15 106 lb (48.1 kg)  06/11/15 106 lb (48.1 kg)   Constitutional: underweight, in NAD, no kyphosis Eyes: PERRLA, EOMI, no exophthalmos ENT: moist mucous membranes, no thyromegaly, no cervical lymphadenopathy Cardiovascular: RRR, No MRG Respiratory: CTA B Gastrointestinal: abdomen soft, NT, ND, BS+ Musculoskeletal: no deformities, strength intact in all 4 Skin: moist, warm, no rashes Neurological: no tremor with outstretched hands,DTR +3/5 in all 4  Assessment: 1. Osteoporosis - 1 incidental vb fx: moderate wedge - L2 - s/p 20 years of Bisphosphonates - no SEs - on Boniva previously, now on Prolia   Plan: 1. Osteoporosis - likely postmenopausal - Prolia since 09/2013. - tolerating Prolia well, had 5 inj  >> discussed not to leave >7 mo b/w injections - No fractures - We reviewed her DEXA scans together, and I explained that based on the T scores, she has an increased risk for fractures. However, Prolia is helping.  - again discussed full precaution measures  - reviewed last vitamin D level, which was normal. She continues on 1000 units of vitamin D daily - at last visit, I recommended to start weightbearing exercises. She did not do this. I suggested OsteoStrong skeletal loading >> given info. - We will check the following tests:  Vitamin D  BMP - plan is to continue Prolia for 6 years  - she had another DEXA scan which we need to obtain from  [facility] >> called office - I will see her back in a year  Component     Latest Ref Rng & Units 07/08/2016  Sodium     135 - 146 mmol/L 140  Potassium     3.5 - 5.3 mmol/L 4.7  Chloride     98 - 110 mmol/L 102  CO2     20 - 31 mmol/L 23  Glucose     65 - 99 mg/dL 82  BUN     7 - 25 mg/dL 18  Creatinine     1.61 - 0.88 mg/dL 0.96 (H)  Calcium     8.6 - 10.4 mg/dL 9.7  GFR, Est African American     >=60 mL/min 67  GFR, Est Non African American     >=60 mL/min 58 (L)  VITD  30.00 - 100.00 ng/mL 39.30   Her vitamin D is normal, but I would still encourage her to continue with 1000 units of vitamin D daily, Taken consistently.  Received records from [facility]: - DEXA scan Deboraha Sprang ) -  07/16/2015: Date L1-L4 (L2)T score RFN T score LFN T score 33% distal Radius  07/16/2015 [facility] [place])  -2.8(+6.8%*)  -2.4 -2.9 -3.9  01/26/2013 ([facility]) -3.2 -2.5 -3.0 -4.4  08/07/2008 ([facility]) L1-L4: -3.0 -3.0 (however, the femur not properly rotated, ROI not properly defined) -2.8 (however, the femur not properly rotated, ROI not properly defined)  n/a  12/16/2000 L1-L4: -3.7 Total Hip: -3.4    12/05/1998 L1-L4: -3.8 Total Hip: -3.3    05/18/1997 L1-L4: -3.7 Total Hip: -3.3     Scores for the last bone density scan are improved compared to before.

## 2016-07-09 ENCOUNTER — Telehealth: Payer: Self-pay

## 2016-07-09 NOTE — Telephone Encounter (Signed)
-----   Message from Carlus Pavlovristina Gherghe, MD sent at 07/09/2016  2:07 PM EDT ----- Raynelle FanningJulie, can you please call pt: Labs are normal, including her vitamin D, but this is lower than before, so I would advise her to start taking the 1000 units of vitamin D daily. Also, please tell her that I received the records of [facility] density from 2017 [facility]. The bone densities have improved significantly at the level of the spine and forearm and only slightly at the level of the hips. This is good news!

## 2016-07-09 NOTE — Telephone Encounter (Signed)
Called patient and gave lab results. Patient had no questions or concerns.  

## 2016-07-21 ENCOUNTER — Ambulatory Visit: Payer: Medicare Other | Admitting: Neurology

## 2016-07-24 ENCOUNTER — Ambulatory Visit: Payer: Medicare Other | Admitting: Internal Medicine

## 2016-11-12 ENCOUNTER — Ambulatory Visit (INDEPENDENT_AMBULATORY_CARE_PROVIDER_SITE_OTHER): Payer: Medicare Other

## 2016-11-12 DIAGNOSIS — M810 Age-related osteoporosis without current pathological fracture: Secondary | ICD-10-CM

## 2016-11-12 MED ORDER — DENOSUMAB 60 MG/ML ~~LOC~~ SOLN
60.0000 mg | Freq: Once | SUBCUTANEOUS | Status: AC
  Administered 2016-11-12: 60 mg via SUBCUTANEOUS

## 2016-12-25 ENCOUNTER — Encounter: Payer: Self-pay | Admitting: Neurology

## 2016-12-31 ENCOUNTER — Encounter: Payer: Self-pay | Admitting: Neurology

## 2016-12-31 ENCOUNTER — Encounter (INDEPENDENT_AMBULATORY_CARE_PROVIDER_SITE_OTHER): Payer: Self-pay

## 2016-12-31 ENCOUNTER — Ambulatory Visit: Payer: Medicare Other | Admitting: Neurology

## 2016-12-31 VITALS — BP 131/64 | HR 69 | Ht 62.0 in | Wt 114.0 lb

## 2016-12-31 DIAGNOSIS — G43009 Migraine without aura, not intractable, without status migrainosus: Secondary | ICD-10-CM | POA: Diagnosis not present

## 2016-12-31 MED ORDER — PROPRANOLOL HCL 10 MG PO TABS: 20.0000 mg | ORAL_TABLET | Freq: Two times a day (BID) | ORAL | 3 refills | Status: DC

## 2016-12-31 NOTE — Progress Notes (Signed)
Reason for visit: Headache  Referring physician: Dr. Salley SlaughterSmith  Jeanne Lopez is a 83 y.o. female  History of present illness:  Jeanne Lopez is an 83 year old white female with a history of headaches throughout her life that began in the teenage years.  The patient has had gradually worsening headaches as she has gotten older.  The headaches are usually bifrontal or on the right side of the head.  The patient does note photophobia with a headache without photophobia.  She denies any nausea or vomiting or any numbness or weakness of the face, arms, or legs.  She at times may have some cognitive clouding with a headache.  She has never been on a daily preventative medication, she has always taking rescue medications for her headache.  She currently is having about 2 headaches a week, the headaches may last all day long.  She has been taking large amounts of Excedrin Migraine up until about 1 week ago when she stopped this medication as a request from her primary care physician.  She has also reduced her trazodone from 2 tablets at night to 1 tablet at night and her daytime drowsiness has improved.  The patient has a lot of underlying anxiety issues.  She does have a mild memory disorder as well.  There is no family history of migraine.  She is sent here for further evaluation.  Past Medical History:  Diagnosis Date  . Anxiety   . Depression   . Heart murmur   . Insomnia   . Meniere's disease   . Migraine   . Osteoporosis     Past Surgical History:  Procedure Laterality Date  . APPENDECTOMY    . BREAST LUMPECTOMY Right   . DILATION AND CURETTAGE OF UTERUS      Family History  Problem Relation Age of Onset  . Hypertension Brother   . Cancer Sister   . Hypertension Sister   . Hypertension Mother   . Heart attack Mother   . Hypertension Father   . Stroke Father   . Alcohol abuse Daughter   . Depression Daughter   . Drug abuse Daughter   . Alcohol abuse Son   . Depression Son     . Drug abuse Son   . CVA Maternal Grandmother   . Kidney disease Neg Hx   . Hyperlipidemia Neg Hx   . Heart disease Neg Hx   . Early death Neg Hx   . Arthritis Neg Hx     Social history:  reports that  has never smoked. she has never used smokeless tobacco. She reports that she does not drink alcohol or use drugs.  Medications:  Prior to Admission medications   Medication Sig Start Date End Date Taking? Authorizing Provider  ibuprofen (ADVIL,MOTRIN) 600 MG tablet Take 600 mg by mouth every 6 (six) hours as needed.   Yes [provider]  polyethylene glycol (MIRALAX / GLYCOLAX) packet Take 17 g by mouth daily.   Yes [provider]  traZODone (DESYREL) 100 MG tablet Take 2 tablets (200 mg total) by mouth at bedtime. Patient taking differently: Take 100 mg by mouth at bedtime.  07/12/14  Yes Etta GrandchildJones, Thomas L, MD      Allergies  Allergen Reactions  . Codeine Nausea And Vomiting    ROS:  Out of a complete 14 system review of symptoms, the patient complains only of the following symptoms, and all other reviewed systems are negative.  Fatigue Hearing loss, dizziness  Constipation Memory loss, confusion, headache Depression, anxiety, disinterest in activities Insomnia  Blood pressure 131/64, pulse 69, height 5\' 2"  (1.575 m), weight 114 lb (51.7 kg).  Physical Exam  General: The patient is alert and cooperative at the time of the examination.  Eyes: Pupils are equal, round, and reactive to light. Discs are flat bilaterally.  Neck: The neck is supple, no carotid bruits are noted.  Respiratory: The respiratory examination is clear.  Cardiovascular: The cardiovascular examination reveals a regular rate and rhythm, no obvious murmurs or rubs are noted.  Skin: Extremities are without significant edema.  Neurologic Exam  Mental status: The patient is alert and oriented x 3 at the time of the examination. The patient has apparent normal recent and remote  memory, with an apparently normal attention span and concentration ability.  Cranial nerves: Facial symmetry is present. There is good sensation of the face to pinprick and soft touch bilaterally. The strength of the facial muscles and the muscles to head turning and shoulder shrug are normal bilaterally. Speech is well enunciated, no aphasia or dysarthria is noted. Extraocular movements are full. Visual fields are full. The tongue is midline, and the patient has symmetric elevation of the soft palate. No obvious hearing deficits are noted.  Motor: The motor testing reveals 5 over 5 strength of all 4 extremities. Good symmetric motor tone is noted throughout.  Sensory: Sensory testing is intact to pinprick, soft touch, vibration sensation, and position sense on all 4 extremities, with exception of some decreased position sense in both feet. No evidence of extinction is noted.  Coordination: Cerebellar testing reveals good finger-nose-finger and heel-to-shin bilaterally.  Gait and station: Gait is normal. Tandem gait is unsteady. Romberg is negative. No drift is seen.  Reflexes: Deep tendon reflexes are symmetric and normal bilaterally. Toes are downgoing bilaterally.   MRI brain 09/06/13:  IMPRESSION: Progression of atrophy and chronic microvascular ischemia in the white matter. No acute abnormality.  * MRI scan images were reviewed online. I agree with the written report.   Assessment/Plan:  1.  History of migraine headache  The patient is having about 2 headaches a week, she has never been on a daily preventative medication.  She is now taking ibuprofen if needed for the headache, she is off Excedrin Migraine which is reasonable.  The patient will be placed on low-dose propranolol for the headache, she will follow-up in 3 months.  She will call for any dose adjustments or any problems with tolerance of the drug.  The use of Aimovig is not indicated secondary to the frequency of her  headaches and the fact that she has never been on any other daily preventative drug.  Marlan Palau. Keith Willis MD 12/31/2016 2:11 PM  [facility] [address] [place], [place] [id number]  Phone [phone] Fax [phone]

## 2016-12-31 NOTE — Patient Instructions (Signed)
   We will start propranolol 10 mg tablets one twice a day for 2 weeks, then take 2 twice a day.  Inderal (propranolol) is a blood pressure medication that is commonly used for migraine headaches. This is a type of beta blocker. The most common side effects include low heart rate, dizziness, fatigue, and increased depression. This medication may worsen asthma. If you believe that you are having side effects on this medication, please contact our office.

## 2017-04-02 ENCOUNTER — Ambulatory Visit: Payer: Medicare Other | Admitting: Neurology

## 2017-04-02 ENCOUNTER — Encounter: Payer: Self-pay | Admitting: Neurology

## 2017-04-02 VITALS — HR 76 | Wt 111.5 lb

## 2017-04-02 DIAGNOSIS — R413 Other amnesia: Secondary | ICD-10-CM | POA: Diagnosis not present

## 2017-04-02 DIAGNOSIS — G43009 Migraine without aura, not intractable, without status migrainosus: Secondary | ICD-10-CM

## 2017-04-02 HISTORY — DX: Other amnesia: R41.3

## 2017-04-02 MED ORDER — PROPRANOLOL HCL 20 MG PO TABS: 20.0000 mg | ORAL_TABLET | Freq: Two times a day (BID) | ORAL | 6 refills | Status: DC

## 2017-04-02 MED ORDER — DONEPEZIL HCL 5 MG PO TABS: 5.0000 mg | ORAL_TABLET | Freq: Every day | ORAL | 1 refills | Status: DC

## 2017-04-02 NOTE — Patient Instructions (Signed)
We will start aricept 5 mg at night.  Begin Aricept (donepezil) at 5 mg at night for one month. If this medication is well-tolerated, please call our office and we will call in a prescription for the 10 mg tablets. Look out for side effects that may include nausea, diarrhea, weight loss, or stomach cramps. This medication will also cause a runny nose, therefore there is no need for allergy medications for this purpose.  

## 2017-04-02 NOTE — Progress Notes (Signed)
Reason for visit: Migraine headaches, memory disorder  Jeanne Lopez is an 84 y.o. female  History of present illness:  Jeanne Lopez is an 84 year old white female with a history of migraine headaches since she was a teenager.  The patient was placed on propranolol, she currently is taking 20 mg twice daily.  She has gained good benefit with the migraine frequency.  She has also stopped taking Excedrin Migraine as frequently as she used to.  This may have helped her potential for rebound headaches.  The patient is having 2 or 3 headaches a month, she was having 2 or 3 headaches a week.  The patient is quite pleased with her progress.  The patient also reports that she has had some problems with memory over the last 18 months or so, from the records the patient underwent a memory workup with MRI of the brain and blood work in 2015.  The patient is driving a car some, she has had some difficulty getting lost.  She lives with her daughter, her daughter manages the finances, and keeps track of medications and appointments.  The patient takes trazodone at night for sleep, she does fairly well with this.  The patient is not on any medications for memory.  Past Medical History:  Diagnosis Date  . Anxiety   . Depression   . Heart murmur   . Insomnia   . Meniere's disease   . Migraine   . Osteoporosis     Past Surgical History:  Procedure Laterality Date  . APPENDECTOMY    . BREAST LUMPECTOMY Right   . DILATION AND CURETTAGE OF UTERUS      Family History  Problem Relation Age of Onset  . Hypertension Brother   . Cancer Sister   . Hypertension Sister   . Hypertension Mother   . Heart attack Mother   . Hypertension Father   . Stroke Father   . Alcohol abuse Daughter   . Depression Daughter   . Drug abuse Daughter   . Alcohol abuse Son   . Depression Son   . Drug abuse Son   . CVA Maternal Grandmother   . Kidney disease Neg Hx   . Hyperlipidemia Neg Hx   . Heart disease Neg Hx    . Early death Neg Hx   . Arthritis Neg Hx     Social history:  reports that  has never smoked. she has never used smokeless tobacco. She reports that she does not drink alcohol or use drugs.    Allergies  Allergen Reactions  . Codeine Nausea And Vomiting    Medications:  Prior to Admission medications   Medication Sig Start Date End Date Taking? Authorizing Provider  ibuprofen (ADVIL,MOTRIN) 600 MG tablet Take 600 mg by mouth every 6 (six) hours as needed.   Yes [provider]  polyethylene glycol (MIRALAX / GLYCOLAX) packet Take 17 g by mouth daily.   Yes [provider]  propranolol (INDERAL) 10 MG tablet Take 2 tablets (20 mg total) by mouth 2 (two) times daily. 12/31/16  Yes York Spaniel, MD  traZODone (DESYREL) 100 MG tablet Take 2 tablets (200 mg total) by mouth at bedtime. Patient taking differently: Take 100 mg by mouth at bedtime.  07/12/14  Yes Etta Grandchild, MD  Venlafaxine HCl (EFFEXOR XR PO) Take 150 mg by mouth daily.   Yes [provider]  donepezil (ARICEPT) 5 MG tablet Take 1 tablet (5 mg total) by mouth  at bedtime. 04/02/17   York SpanielWillis, Tammie Ellsworth K, MD    ROS:  Out of a complete 14 system review of symptoms, the patient complains only of the following symptoms, and all other reviewed systems are negative.  Memory disorder Headache  Pulse 76, weight 111 lb 8 oz (50.6 kg).  Physical Exam  General: The patient is alert and cooperative at the time of the examination.  Skin: No significant peripheral edema is noted.   Neurologic Exam  Mental status: The patient is alert and oriented x 2 at the time of the examination (not oriented to date). The Mini-Mental status examination done today shows a total score of 16/30.   Cranial nerves: Facial symmetry is present. Speech is normal, no aphasia or dysarthria is noted. Extraocular movements are full. Visual fields are full.  Motor: The patient has good strength in all 4  extremities.  Sensory examination: Soft touch sensation is symmetric on the face, arms, and legs.  Coordination: The patient has good finger-nose-finger and heel-to-shin bilaterally.  Some mild apraxia with use of the extremities is noted.  Gait and station: The patient has a normal gait. Tandem gait is slightly unsteady. Romberg is negative. No drift is seen.  Reflexes: Deep tendon reflexes are symmetric.   MRI brain 09/06/13:  IMPRESSION: Progression of atrophy and chronic microvascular ischemia in the white matter. No acute abnormality.  * MRI scan images were reviewed online. I agree with the written report.    Assessment/Plan:  1.  Migraine headaches  2.  Memory disorder  The patient has undergone a previous workup for memory in 2015.  MRI shows mild to moderate white matter changes, blood work included an RPR and B12 level that were unremarkable.  The patient will be placed on low-dose Aricept taking 5 mg at night.  She will call in 1 month if she tolerates the medication and we will call in the 10 mg tablet as a maintenance dose.  The patient will remain on propranolol 20 mg twice daily.  She will follow-up in about 6 months.  The patient has done quite well with her migraine.  Marlan Palau. Keith Enes Wegener MD 04/02/2017 4:53 PM  [facility] [address] [place], [place] [id number]  Phone [phone] Fax [phone]

## 2017-04-14 ENCOUNTER — Ambulatory Visit: Payer: Medicare Other | Admitting: Neurology

## 2017-05-22 ENCOUNTER — Other Ambulatory Visit: Payer: Self-pay | Admitting: Neurology

## 2017-05-25 NOTE — Telephone Encounter (Signed)
LVM for daughter [name] [place] ([place] to call back about refill requests. Gave [facility] phone number.

## 2017-05-26 MED ORDER — PROPRANOLOL HCL 20 MG PO TABS: 20.0000 mg | ORAL_TABLET | Freq: Two times a day (BID) | ORAL | 5 refills | Status: DC

## 2017-05-26 MED ORDER — DONEPEZIL HCL 10 MG PO TABS: 10.0000 mg | ORAL_TABLET | Freq: Every day | ORAL | 3 refills | Status: DC

## 2017-05-26 NOTE — Telephone Encounter (Signed)
Patient's daughter [name] is returning your call. She can be reached at [phone].

## 2017-05-26 NOTE — Telephone Encounter (Signed)
Daughter called office back. Mother doing well on donepezil 5mg  tab qhs. No SE. Agreeable to increase to 10mg  tab qhs as previously discussed. Went over potential SE again with daughter. She will call w/ any questions/concerns.    She also never got rx sent in for propranolol 20mg  tab, directions: 1 tab BID. She is currently still taking propranolol 10mg , 2 tabs twice daily. She remembers Dr. Anne HahnWillis was going to call in new prescription so she would only have to take one tablet BID. Advised I will resend this rx for them as well. She verbalized understanding and appreciation for call.

## 2017-06-03 ENCOUNTER — Telehealth: Payer: Self-pay

## 2017-06-03 NOTE — Telephone Encounter (Signed)
Left vm requesting patient to call back to schedule Prolia injection -patient owes $90 out-of-pocket ready to schedule

## 2017-06-25 ENCOUNTER — Telehealth: Payer: Self-pay | Admitting: Neurology

## 2017-06-25 MED ORDER — SUMATRIPTAN SUCCINATE 50 MG PO TABS: 50.0000 mg | ORAL_TABLET | Freq: Two times a day (BID) | ORAL | 3 refills | Status: DC | PRN

## 2017-06-25 NOTE — Addendum Note (Signed)
Addended by: York Spaniel on: 06/25/2017 01:06 PM   Modules accepted: Orders

## 2017-06-25 NOTE — Telephone Encounter (Signed)
LVM with patient and her son requesting a call back to schedule nurse visit for Prolia

## 2017-06-25 NOTE — Telephone Encounter (Signed)
Daughter has called([name] May on [facility]) has called states pt has not had many migraines but when she does they are bad.  Current migraine since yesterday. Over the counter medications do not help.  Daughter asking if there is anything Dr Anne Hahn can call in for pt for when the migraine comes on.  Please call

## 2017-06-25 NOTE — Telephone Encounter (Signed)
I called the patient and talk with the daughter.  The patient is having 2 or 3 headaches a month, but they are quite severe when they do occur.  In the past, Imitrex has been very helpful for her headache.  We will try low-dose Imitrex, the patient will use with caution.  The patient has no history of significant cerebrovascular disease, hypertension, or coronary artery disease.

## 2017-06-30 ENCOUNTER — Ambulatory Visit (INDEPENDENT_AMBULATORY_CARE_PROVIDER_SITE_OTHER): Payer: Medicare Other

## 2017-06-30 DIAGNOSIS — M810 Age-related osteoporosis without current pathological fracture: Secondary | ICD-10-CM

## 2017-06-30 MED ORDER — DENOSUMAB 60 MG/ML ~~LOC~~ SOSY
60.0000 mg | PREFILLED_SYRINGE | Freq: Once | SUBCUTANEOUS | Status: AC
  Administered 2017-06-30: 60 mg via SUBCUTANEOUS

## 2017-07-02 ENCOUNTER — Ambulatory Visit: Payer: Medicare Other

## 2017-07-08 ENCOUNTER — Ambulatory Visit: Payer: Medicare Other | Admitting: Internal Medicine

## 2017-07-08 VITALS — BP 100/60 | HR 56 | Ht 61.25 in | Wt 110.0 lb

## 2017-07-08 DIAGNOSIS — M810 Age-related osteoporosis without current pathological fracture: Secondary | ICD-10-CM

## 2017-07-08 LAB — VITAMIN D 25 HYDROXY (VIT D DEFICIENCY, FRACTURES): VITD: 48.05 ng/mL (ref 30.00–100.00)

## 2017-07-08 LAB — BASIC METABOLIC PANEL WITH GFR
BUN: 20 mg/dL (ref 7–25)
CALCIUM: 10.1 mg/dL (ref 8.6–10.4)
CO2: 29 mmol/L (ref 20–32)
Chloride: 105 mmol/L (ref 98–110)
Creat: 0.77 mg/dL (ref 0.60–0.88)
GFR, EST NON AFRICAN AMERICAN: 72 mL/min/{1.73_m2} (ref >=60)
GFR, Est African American: 83 mL/min/{1.73_m2} (ref >=60)
GLUCOSE: 65 mg/dL (ref 65–99)
Potassium: 5.2 mmol/L (ref 3.5–5.3)
Sodium: 141 mmol/L (ref 135–146)

## 2017-07-08 NOTE — Patient Instructions (Addendum)
Please stop at the lab.  Please ask Dr. Katrinka Blazing to order a new DXA scan after 07/15/2017.  You will need to start back on vitamin D but I will let you know the dose after results are back.  Please return in 1 year.

## 2017-07-08 NOTE — Progress Notes (Signed)
Patient ID: Jeanne Lopez, female   DOB: [birth year], 84 y.o.   MRN: [id number]   HPI  Jeanne Lopez is a 84 y.o.-year-old female, initially referred by Dr. Severiano Gilbert, returning for follow-up for f osteoporosis. Last visit a year ago.  No falls in fracture since last visit  Reviewed and addended hx: Pt was dx with OP in her 84s.  No fxs, but had a vb fx found on VFA analysis in 01/2013: moderate wedge, L2.  Reviewing the chart, she also had a right foot fracture in 12/11/2000.  She also has dizziness/vertigo (Meniere's ds.) -Controlled.  I reviewed patient's latest DEXA scans, including the last one from 2017, which w showed improvement: Date L1-L4 (L2)T score RFN T score LFN T score 33% distal Radius  07/16/2015 [facility], [place])  -2.8(+6.8%*)  -2.4 -2.9 -3.9  01/26/2013 ([facility], [place]) -3.2 -2.5 -3.0 -4.4  08/07/2008 ([facility]) L1-L4: -3.0 -3.0 (however, the femur not properly rotated, ROI not properly defined) -2.8 (however, the femur not properly rotated, ROI not properly defined)  n/a  12/16/2000 L1-L4: -3.7 Total Hip: -3.4    12/05/1998 L1-L4: -3.8 Total Hip: -3.3    05/18/1997 L1-L4: -3.7 Total Hip: -3.3     Scores for the last bone density scan are improved compared to before.  She has been on the following osteoporosis treatments:  - estrogen 1976-1986 - on Fosamax 1986-2003 - on Boniva 2004-2015 - on Prolia: 7 doses: 09/15/2013, 05/30/2014, 11/2014, 06/11/2015, 04/17/2016, 11/12/2016, 06/30/2017   No history of hyper/hypocalcemia.  No history of/o hyperparathyroidism.  No history of kidney stones. Lab Results  Component Value Date   PTH 25.9 08/11/2013   CALCIUM 9.7 07/08/2016   CALCIUM 8.7 (L) 08/13/2015   CALCIUM 9.6 06/11/2015   CALCIUM 9.8 07/12/2014   CALCIUM 9.3 06/09/2014   CALCIUM 9.7 08/11/2013   CALCIUM 8.7 09/13/2008   No history of thyrotoxicosis.   Reviewed TSH recent levels: Lab Results  Component Value Date   TSH 1.65  07/12/2014   Last vitamin D level were normal: Lab Results  Component Value Date   VD25OH 39.30 07/08/2016   VD25OH 48.51 06/11/2015   VD25OH 34.91 06/09/2014   VD25OH 57.99 08/11/2013   Patient is on vitamin D 1000 units daily, but was forgetting it frequently at last visit so we discussed about the need to take it consistently. At this visit, she tells me that she does not think she is taking it at all...   She eats plenty of green leafy vegetables, and not much dairy.  No CKD. Last BUN/Cr: Lab Results  Component Value Date   BUN 18 07/08/2016   CREATININE 0.93 (H) 07/08/2016   She is walking her dog daily for exercise  ROS: Constitutional: no weight gain/no weight loss, no fatigue, no subjective hyperthermia, no subjective hypothermia Eyes: no blurry vision, no xerophthalmia ENT: no sore throat, no nodules palpated in throat, no dysphagia, no odynophagia, no hoarseness Cardiovascular: no CP/no SOB/no palpitations/no leg swelling Respiratory: no cough/no SOB/no wheezing Gastrointestinal: no N/no V/no D/no C/no acid reflux Musculoskeletal: no muscle aches/+ joint aches Skin: no rashes, no hair loss Neurological: no tremors/no numbness/no tingling/no dizziness, + HAs  I reviewed pt's medications, allergies, PMH, social hx, family hx, and changes were documented in the history of present illness. Otherwise, unchanged from my initial visit note.  Past Medical History:  Diagnosis Date  . Anxiety   . Depression   . Heart murmur   . Insomnia   .  Memory disorder 04/02/2017  . Meniere's disease   . Migraine   . Osteoporosis    Past Surgical History:  Procedure Laterality Date  . APPENDECTOMY    . BREAST LUMPECTOMY Right   . DILATION AND CURETTAGE OF UTERUS     History   Social History  . Marital Status: Widowed    Spouse Name: N/A    Number of Children: 3   Social History Main Topics  . Smoking status: Never Smoker   . Smokeless tobacco: Not on file  . Alcohol  Use: No  . Drug Use: No   Social History Narrative   Lives with daughter and dog. Grandchildren in frequently to help her.    Retired.    Current Outpatient Medications on File Prior to Visit  Medication Sig Dispense Refill  . donepezil (ARICEPT) 10 MG tablet Take 1 tablet (10 mg total) by mouth at bedtime. 30 tablet 3  . ibuprofen (ADVIL,MOTRIN) 600 MG tablet Take 600 mg by mouth every 6 (six) hours as needed.    . polyethylene glycol (MIRALAX / GLYCOLAX) packet Take 17 g by mouth daily.    . propranolol (INDERAL) 20 MG tablet Take 1 tablet (20 mg total) by mouth 2 (two) times daily. 60 tablet 5  . SUMAtriptan (IMITREX) 50 MG tablet Take 1 tablet (50 mg total) by mouth 2 (two) times daily as needed for migraine. 10 tablet 3  . traZODone (DESYREL) 100 MG tablet Take 2 tablets (200 mg total) by mouth at bedtime. (Patient taking differently: Take 100 mg by mouth at bedtime. ) 180 tablet 3  . Venlafaxine HCl (EFFEXOR XR PO) Take 150 mg by mouth daily.     No current facility-administered medications on file prior to visit.    Allergies  Allergen Reactions  . Codeine Nausea And Vomiting   FH: see HPI + - HTN: in sister, mother - CVA: in MGM  PE: BP 100/60 (BP Location: Left Arm, Patient Position: Sitting, Cuff Size: Normal)   Pulse (!) 56   Ht 5' 1.25" (1.556 m)   Wt 110 lb (49.9 kg)   SpO2 98%   BMI 20.61 kg/m  Body mass index is 20.61 kg/m. Wt Readings from Last 3 Encounters:  07/08/17 110 lb (49.9 kg)  04/02/17 111 lb 8 oz (50.6 kg)  12/31/16 114 lb (51.7 kg)   Constitutional: thin, in NAD Eyes: PERRLA, EOMI, no exophthalmos ENT: moist mucous membranes, no thyromegaly, no cervical lymphadenopathy Cardiovascular: RRR, No MRG Respiratory: CTA B Gastrointestinal: abdomen soft, NT, ND, BS+ Musculoskeletal: no deformities, strength intact in all 4 Skin: moist, warm, no rashes Neurological: no tremor with outstretched hands, DTR +3/5 in all 4  Assessment: 1.  Osteoporosis - 1 incidental vb fx: moderate wedge - L2 >> healed - s/p 20 years of Bisphosphonates - no SEs - on Boniva previously, now on Prolia   Plan: 1. Osteoporosis - likely age-related and postmenopausal - Prolia was started in 09/2013 - Tolerating Prolia well, without side effects, she had 7 injections so far well, had 5 inj  >>  again discussed not to leave more than 7 months between the injections due to rapid decrease in bone density after stopping Prolia.  She has no hip pain, thigh pain, jaw pain. + dental work in progress - dental cap needs to be placed. She is not a smoker. - No fracture since last visit - We reviewed the latest DXA scans together >> in 2017, DXA scan showed improvement most likely  due to Prolia injections - Again discussed fall precaution measures - Reviewed latest vitamin D level from last visit, which was normal.  She thinks she is not taking the 1000 units of vitamin D daily now.... - I suggested osteo-strong skeletal loading in the past, but she did not do this - Today we will check the following tests:  Vitamin D  BMP - Plan is to continue Prolia for at least 6 years, but we can go as long as 10 years - We will need another DXA scan - in June of this year >> she will need to get [facility] -  I will see her back in a year  - time spent with the patient: 15, of which >50% was spent in obtaining information about her symptoms, reviewing her previous labs, evaluations, and treatments, counseling her about her condition (please see the discussed topics above), and developing a plan to further investigate and treat it.  Component     Latest Ref Rng & Units 07/08/2017  Glucose     65 - 99 mg/dL 65  BUN     7 - 25 mg/dL 20  Creatinine     4.09 - 0.88 mg/dL 8.11  GFR, Est Non African American     > OR = 60 mL/min/1.54m2 72  GFR, Est African American     > OR = 60 mL/min/1.23m2 83  BUN/Creatinine Ratio     6 - 22 (calc) NOT APPLICABLE  Sodium      135 - 146 mmol/L 141  Potassium     3.5 - 5.3 mmol/L 5.2  Chloride     98 - 110 mmol/L 105  CO2     20 - 32 mmol/L 29  Calcium     8.6 - 10.4 mg/dL 91.4  VITD     78.29 - 100.00 ng/mL 48.05   Tests are normal, including her vitamin D level.  I would suggest to take 1000 units of vitamin D daily.  Carlus Pavlov, MD PhD [facility] Endocr[facility]

## 2017-07-09 ENCOUNTER — Encounter: Payer: Self-pay | Admitting: Internal Medicine

## 2017-09-29 ENCOUNTER — Other Ambulatory Visit: Payer: Self-pay | Admitting: Neurology

## 2017-09-29 NOTE — Telephone Encounter (Signed)
Refill for Donepezil [facility] to [facility].

## 2017-10-09 ENCOUNTER — Ambulatory Visit: Payer: Medicare Other | Admitting: Neurology

## 2017-10-09 ENCOUNTER — Encounter: Payer: Self-pay | Admitting: Neurology

## 2017-10-09 VITALS — BP 105/60 | HR 76 | Ht 61.25 in | Wt 112.5 lb

## 2017-10-09 DIAGNOSIS — R413 Other amnesia: Secondary | ICD-10-CM

## 2017-10-09 DIAGNOSIS — G43009 Migraine without aura, not intractable, without status migrainosus: Secondary | ICD-10-CM

## 2017-10-09 MED ORDER — DONEPEZIL HCL 10 MG PO TABS: 10.0000 mg | ORAL_TABLET | Freq: Every day | ORAL | 3 refills | Status: DC

## 2017-10-09 NOTE — Progress Notes (Signed)
Reason for visit: Memory disturbance, migraine headache  Jeanne Lopez is an 84 y.o. female  History of present illness:  Ms. Jeanne Lopez is an 84 year old right-handed white female with a history of migraine headaches and a memory disorder.  The patient is on Aricept currently, she comes in today with her daughter who believes that her cognitive status has been stable on the medication.  She takes 10 mg at night and tolerates the drug well.  She has gained 1 pound since last seen.  She sleeps well at night.  She is having on average about 1 headache a week, low-dose Imitrex seems to help significantly for the headache.  The patient takes propranolol 20 mg twice daily, she tolerates the medication well.  She returns for an evaluation.  Past Medical History:  Diagnosis Date  . Anxiety   . Depression   . Heart murmur   . Insomnia   . Memory disorder 04/02/2017  . Meniere's disease   . Migraine   . Osteoporosis     Past Surgical History:  Procedure Laterality Date  . APPENDECTOMY    . BREAST LUMPECTOMY Right   . DILATION AND CURETTAGE OF UTERUS      Family History  Problem Relation Age of Onset  . Hypertension Brother   . Cancer Sister   . Hypertension Sister   . Hypertension Mother   . Heart attack Mother   . Hypertension Father   . Stroke Father   . Alcohol abuse Daughter   . Depression Daughter   . Drug abuse Daughter   . Alcohol abuse Son   . Depression Son   . Drug abuse Son   . CVA Maternal Grandmother   . Kidney disease Neg Hx   . Hyperlipidemia Neg Hx   . Heart disease Neg Hx   . Early death Neg Hx   . Arthritis Neg Hx     Social history:  reports that she has never smoked. She has never used smokeless tobacco. She reports that she does not drink alcohol or use drugs.    Allergies  Allergen Reactions  . Codeine Nausea And Vomiting    Medications:  Prior to Admission medications   Medication Sig Start Date End Date Taking? Authorizing Provider    donepezil (ARICEPT) 10 MG tablet Take 1 tablet (10 mg total) by mouth at bedtime. 05/26/17   York SpanielWillis, Desean Heemstra K, MD  donepezil (ARICEPT) 10 MG tablet TAKE ONE TABLET AT BEDTIME. 09/29/17   York SpanielWillis, Kendyll Huettner K, MD  ibuprofen (ADVIL,MOTRIN) 600 MG tablet Take 600 mg by mouth every 6 (six) hours as needed.    [provider]  polyethylene glycol (MIRALAX / GLYCOLAX) packet Take 17 g by mouth daily.    [provider]  propranolol (INDERAL) 20 MG tablet Take 1 tablet (20 mg total) by mouth 2 (two) times daily. 05/26/17   York SpanielWillis, Treylan Mcclintock K, MD  SUMAtriptan (IMITREX) 50 MG tablet Take 1 tablet (50 mg total) by mouth 2 (two) times daily as needed for migraine. 06/25/17   York SpanielWillis, Phinley Schall K, MD  traZODone (DESYREL) 100 MG tablet Take 2 tablets (200 mg total) by mouth at bedtime. Patient taking differently: Take 100 mg by mouth at bedtime.  07/12/14   Etta GrandchildJones, Thomas L, MD  Venlafaxine HCl (EFFEXOR XR PO) Take 150 mg by mouth daily.    [provider]    ROS:  Out of a complete 14 system review of symptoms, the patient complains only of the  following symptoms, and all other reviewed systems are negative.  Memory disturbance Headache  Blood pressure 105/60, pulse 76, height 5' 1.25" (1.556 m), weight 112 lb 8 oz (51 kg).  Physical Exam  General: The patient is alert and cooperative at the time of the examination.  Skin: No significant peripheral edema is noted.   Neurologic Exam  Mental status: The patient is alert and oriented x 3 at the time of the examination. The Mini-Mental status examination done today shows a total score of 25/30.   Cranial nerves: Facial symmetry is present. Speech is normal, no aphasia or dysarthria is noted. Extraocular movements are full. Visual fields are full.  Motor: The patient has good strength in all 4 extremities.  Sensory examination: Soft touch sensation is symmetric on the face, arms, and legs.  Coordination: The patient has good  finger-nose-finger and heel-to-shin bilaterally.  Gait and station: The patient has a normal gait. Tandem gait is unsteady. Romberg is negative, but is unsteady. No drift is seen.  Reflexes: Deep tendon reflexes are symmetric.   Assessment/Plan:  1.  Migraine headache  2.  Memory disturbance  The patient will continue the Aricept 10 mg at night, on the next refill for the propranolol, we may consider going up to the 60 mg LA capsule taking 1 a day.  The patient will follow-up to this office in about 6 months.  Marlan Palau MD 10/09/2017 11:10 AM  [facility] [address] [place], [place] [address]  Phone [phone] Fax [phone]

## 2017-11-11 ENCOUNTER — Other Ambulatory Visit: Payer: Self-pay | Admitting: Neurology

## 2017-12-07 ENCOUNTER — Other Ambulatory Visit: Payer: Self-pay | Admitting: Neurology

## 2017-12-30 ENCOUNTER — Telehealth: Payer: Self-pay

## 2017-12-30 NOTE — Telephone Encounter (Signed)
LVM requesting patient call back to schedule nurse visit for Prolia- she owes $90 at check-in and can be scheduled after 01/01/18

## 2018-01-19 ENCOUNTER — Ambulatory Visit: Payer: Medicare Other

## 2018-01-19 DIAGNOSIS — M810 Age-related osteoporosis without current pathological fracture: Secondary | ICD-10-CM | POA: Diagnosis not present

## 2018-01-19 MED ORDER — DENOSUMAB 60 MG/ML ~~LOC~~ SOSY
60.0000 mg | PREFILLED_SYRINGE | Freq: Once | SUBCUTANEOUS | Status: AC
  Administered 2018-01-19: 60 mg via SUBCUTANEOUS

## 2018-01-19 NOTE — Progress Notes (Signed)
Per orders of Dr. Gherghe injection of Prolia given today by Mayzie Caughlin, Certified Medical Assistant . Patient tolerated injection well.  

## 2018-03-17 ENCOUNTER — Emergency Department (HOSPITAL_COMMUNITY): Payer: Medicare Other

## 2018-03-17 ENCOUNTER — Encounter (HOSPITAL_COMMUNITY): Payer: Self-pay | Admitting: *Deleted

## 2018-03-17 ENCOUNTER — Other Ambulatory Visit: Payer: Self-pay

## 2018-03-17 ENCOUNTER — Inpatient Hospital Stay (HOSPITAL_COMMUNITY)
Admission: EM | Admit: 2018-03-17 | Discharge: 2018-03-24 | DRG: 871 | Disposition: A | Discharge status: Home Health | Payer: Medicare Other | Attending: Family Medicine | Admitting: Family Medicine

## 2018-03-17 DIAGNOSIS — R69 Illness, unspecified: Secondary | ICD-10-CM

## 2018-03-17 DIAGNOSIS — F0390 Unspecified dementia without behavioral disturbance: Secondary | ICD-10-CM | POA: Diagnosis present

## 2018-03-17 DIAGNOSIS — A4151 Sepsis due to Escherichia coli [E. coli]: Secondary | ICD-10-CM | POA: Diagnosis not present

## 2018-03-17 DIAGNOSIS — Z79899 Other long term (current) drug therapy: Secondary | ICD-10-CM

## 2018-03-17 DIAGNOSIS — A419 Sepsis, unspecified organism: Secondary | ICD-10-CM | POA: Diagnosis present

## 2018-03-17 DIAGNOSIS — I313 Pericardial effusion (noninflammatory): Secondary | ICD-10-CM | POA: Diagnosis present

## 2018-03-17 DIAGNOSIS — G4700 Insomnia, unspecified: Secondary | ICD-10-CM | POA: Diagnosis present

## 2018-03-17 DIAGNOSIS — Z9049 Acquired absence of other specified parts of digestive tract: Secondary | ICD-10-CM

## 2018-03-17 DIAGNOSIS — Y92230 Patient room in hospital as the place of occurrence of the external cause: Secondary | ICD-10-CM | POA: Diagnosis not present

## 2018-03-17 DIAGNOSIS — F419 Anxiety disorder, unspecified: Secondary | ICD-10-CM | POA: Diagnosis present

## 2018-03-17 DIAGNOSIS — K811 Chronic cholecystitis: Secondary | ICD-10-CM | POA: Diagnosis present

## 2018-03-17 DIAGNOSIS — F329 Major depressive disorder, single episode, unspecified: Secondary | ICD-10-CM | POA: Diagnosis present

## 2018-03-17 DIAGNOSIS — Z885 Allergy status to narcotic agent status: Secondary | ICD-10-CM

## 2018-03-17 DIAGNOSIS — Z8249 Family history of ischemic heart disease and other diseases of the circulatory system: Secondary | ICD-10-CM

## 2018-03-17 DIAGNOSIS — R509 Fever, unspecified: Secondary | ICD-10-CM

## 2018-03-17 DIAGNOSIS — Z818 Family history of other mental and behavioral disorders: Secondary | ICD-10-CM

## 2018-03-17 DIAGNOSIS — Z66 Do not resuscitate: Secondary | ICD-10-CM | POA: Diagnosis present

## 2018-03-17 DIAGNOSIS — J111 Influenza due to unidentified influenza virus with other respiratory manifestations: Secondary | ICD-10-CM

## 2018-03-17 DIAGNOSIS — T441X5A Adverse effect of other parasympathomimetics [cholinergics], initial encounter: Secondary | ICD-10-CM | POA: Diagnosis not present

## 2018-03-17 DIAGNOSIS — G9341 Metabolic encephalopathy: Secondary | ICD-10-CM | POA: Diagnosis present

## 2018-03-17 DIAGNOSIS — R932 Abnormal findings on diagnostic imaging of liver and biliary tract: Secondary | ICD-10-CM

## 2018-03-17 DIAGNOSIS — I700 Atherosclerosis of aorta: Secondary | ICD-10-CM | POA: Diagnosis present

## 2018-03-17 DIAGNOSIS — R001 Bradycardia, unspecified: Secondary | ICD-10-CM | POA: Diagnosis not present

## 2018-03-17 DIAGNOSIS — T447X5A Adverse effect of beta-adrenoreceptor antagonists, initial encounter: Secondary | ICD-10-CM | POA: Diagnosis not present

## 2018-03-17 DIAGNOSIS — K2980 Duodenitis without bleeding: Secondary | ICD-10-CM | POA: Diagnosis present

## 2018-03-17 DIAGNOSIS — M810 Age-related osteoporosis without current pathological fracture: Secondary | ICD-10-CM | POA: Diagnosis present

## 2018-03-17 DIAGNOSIS — B9781 Human metapneumovirus as the cause of diseases classified elsewhere: Secondary | ICD-10-CM | POA: Diagnosis present

## 2018-03-17 DIAGNOSIS — E876 Hypokalemia: Secondary | ICD-10-CM | POA: Diagnosis present

## 2018-03-17 DIAGNOSIS — N390 Urinary tract infection, site not specified: Secondary | ICD-10-CM | POA: Diagnosis present

## 2018-03-17 DIAGNOSIS — Z823 Family history of stroke: Secondary | ICD-10-CM

## 2018-03-17 DIAGNOSIS — D696 Thrombocytopenia, unspecified: Secondary | ICD-10-CM | POA: Diagnosis present

## 2018-03-17 DIAGNOSIS — Q6101 Congenital single renal cyst: Secondary | ICD-10-CM

## 2018-03-17 DIAGNOSIS — I10 Essential (primary) hypertension: Secondary | ICD-10-CM | POA: Diagnosis present

## 2018-03-17 DIAGNOSIS — G43909 Migraine, unspecified, not intractable, without status migrainosus: Secondary | ICD-10-CM | POA: Diagnosis present

## 2018-03-17 DIAGNOSIS — J208 Acute bronchitis due to other specified organisms: Secondary | ICD-10-CM | POA: Diagnosis present

## 2018-03-17 LAB — CBC WITH DIFFERENTIAL/PLATELET
Abs Immature Granulocytes: 0.06 10*3/uL (ref 0.00–0.07)
BASOS ABS: 0 10*3/uL (ref 0.0–0.1)
Basophils Relative: 0 %
Eosinophils Absolute: 0 10*3/uL (ref 0.0–0.5)
Eosinophils Relative: 0 %
HCT: 44.4 % (ref 36.0–46.0)
Hemoglobin: 14 g/dL (ref 12.0–15.0)
Immature Granulocytes: 1 %
Lymphocytes Relative: 4 %
Lymphs Abs: 0.5 10*3/uL — ABNORMAL LOW (ref 0.7–4.0)
MCH: 30.8 pg (ref 26.0–34.0)
MCHC: 31.5 g/dL (ref 30.0–36.0)
MCV: 97.6 fL (ref 80.0–100.0)
Monocytes Absolute: 0.6 10*3/uL (ref 0.1–1.0)
Monocytes Relative: 5 %
Neutro Abs: 11.1 10*3/uL — ABNORMAL HIGH (ref 1.7–7.7)
Neutrophils Relative %: 90 %
Platelets: 136 10*3/uL — ABNORMAL LOW (ref 150–400)
RBC: 4.55 MIL/uL (ref 3.87–5.11)
RDW: 14.8 % (ref 11.5–15.5)
WBC Morphology: INCREASED
WBC: 12.3 10*3/uL — AB (ref 4.0–10.5)
nRBC: 0 % (ref 0.0–0.2)

## 2018-03-17 LAB — COMPREHENSIVE METABOLIC PANEL
ALT: 33 U/L (ref 0–44)
ANION GAP: 10 (ref 5–15)
AST: 54 U/L — AB (ref 15–41)
Albumin: 3.7 g/dL (ref 3.5–5.0)
Alkaline Phosphatase: 92 U/L (ref 38–126)
BUN: 18 mg/dL (ref 8–23)
CO2: 22 mmol/L (ref 22–32)
Calcium: 8.8 mg/dL — ABNORMAL LOW (ref 8.9–10.3)
Chloride: 105 mmol/L (ref 98–111)
Creatinine, Ser: 0.73 mg/dL (ref 0.44–1.00)
GFR calc Af Amer: >60 mL/min (ref >60)
GFR calc non Af Amer: >60 mL/min (ref >60)
Glucose, Bld: 132 mg/dL — ABNORMAL HIGH (ref 70–99)
Potassium: 3.7 mmol/L (ref 3.5–5.1)
Sodium: 137 mmol/L (ref 135–145)
TOTAL PROTEIN: 6.4 g/dL — AB (ref 6.5–8.1)
Total Bilirubin: 1.2 mg/dL (ref 0.3–1.2)

## 2018-03-17 LAB — URINALYSIS, ROUTINE W REFLEX MICROSCOPIC
Bilirubin Urine: NEGATIVE
Glucose, UA: NEGATIVE mg/dL
Hgb urine dipstick: NEGATIVE
Ketones, ur: 20 mg/dL — AB
Nitrite: NEGATIVE
Protein, ur: NEGATIVE mg/dL
SPECIFIC GRAVITY, URINE: 1.018 (ref 1.005–1.030)
pH: 5 (ref 5.0–8.0)

## 2018-03-17 LAB — INFLUENZA PANEL BY PCR (TYPE A & B)
INFLAPCR: NEGATIVE
Influenza B By PCR: NEGATIVE

## 2018-03-17 LAB — LACTIC ACID, PLASMA: Lactic Acid, Venous: 1.4 mmol/L (ref 0.5–1.9)

## 2018-03-17 MED ORDER — ACETAMINOPHEN 500 MG PO TABS
1000.0000 mg | ORAL_TABLET | Freq: Once | ORAL | Status: AC
  Administered 2018-03-17: 1000 mg via ORAL
  Filled 2018-03-17: qty 2

## 2018-03-17 MED ORDER — OSELTAMIVIR PHOSPHATE 75 MG PO CAPS
75.0000 mg | ORAL_CAPSULE | Freq: Once | ORAL | Status: AC
  Administered 2018-03-17: 75 mg via ORAL
  Filled 2018-03-17: qty 1

## 2018-03-17 MED ORDER — SODIUM CHLORIDE 0.9 % IV BOLUS
500.0000 mL | Freq: Once | INTRAVENOUS | Status: AC
  Administered 2018-03-17: 500 mL via INTRAVENOUS

## 2018-03-17 NOTE — ED Triage Notes (Signed)
Per EMS pt from home with a c/o cough, subjective fever, nausea, poor appetite x 3-4 days. Per EMS pt has hx of dementia however family reports pt was more confused today. VSS, PIV established en route 500 ml NS given.

## 2018-03-17 NOTE — ED Provider Notes (Signed)
[facility]-EMERGENCY DEPT Provider Note   CSN: [id number] Arrival date & time: 03/17/18  1911     History   Chief Complaint Chief Complaint  Patient presents with  . Fever  . Nausea    HPI Jeanne Lopez is a 85 y.o. female.  HPI   Patient here for evaluation of cough, fever, decreased appetite and nausea.  Onset of symptoms several days ago.  Today the patient complained of mid abdominal pain, and reported nausea but has not vomited.  Patient lives with her daughter who is here with her at this time.  No known sick contacts.  She recently had a shingles immunization and had a flu vaccine earlier in the fall.  Patient typically is able to help with her activities of daily living, ambulates, walks dog, and visits with friends several times a week.  Today she has been lethargic and sleepy, not getting up much.  There are no other known modifying factors.  Past Medical History:  Diagnosis Date  . Anxiety   . Depression   . Heart murmur   . Insomnia   . Memory disorder 04/02/2017  . Meniere's disease   . Migraine   . Osteoporosis     Patient Active Problem List   Diagnosis Date Noted  . Memory disorder 04/02/2017  . Chronic constipation 08/11/2014  . Hyperlipidemia with target LDL less than 130 07/12/2014  . Migraine without aura and without status migrainosus, not intractable 07/12/2014  . Other constipation 06/06/2014  . Systolic murmur 04/03/2014  . Dementia arising in the senium and presenium (HCC) 08/16/2013  . Depression with anxiety 08/16/2013  . Osteoporosis 08/11/2013    Past Surgical History:  Procedure Laterality Date  . APPENDECTOMY    . BREAST LUMPECTOMY Right   . DILATION AND CURETTAGE OF UTERUS       OB History   No obstetric history on file.      Home Medications    Prior to Admission medications   Medication Sig Start Date End Date Taking? Authorizing Provider  donepezil (ARICEPT) 10 MG tablet Take 1 tablet (10 mg  total) by mouth at bedtime. 10/09/17  Yes York SpanielWillis, Charles K, MD  polyethylene glycol [facility](MIRALAX / Ethelene HalGLYCOLAX) packet Take 17 g by mouth daily.   Yes [provider]  propranolol (INDERAL) 10 MG tablet TAKE (2) TABLETS TWICE DAILY. Patient taking differently: Take 20 mg by mouth 2 (two) times daily.  12/07/17  Yes York SpanielWillis, Charles K, MD  SUMAtriptan (IMITREX) 50 MG tablet Take 1 tablet at onset of migraine. Can take additional tablet in 2 hours if needed once. Do not take more than 2 tablets in 24 hours. 11/11/17  Yes York SpanielWillis, Charles K, MD  traZODone (DESYREL) 150 MG tablet Take 150 mg by mouth at bedtime.   Yes [provider]  venlafaxine XR (EFFEXOR-XR) 150 MG 24 hr capsule Take 150 mg by mouth daily. 02/12/18  Yes [provider]    Family History Family History  Problem Relation Age of Onset  . Hypertension Brother   . Cancer Sister   . Hypertension Sister   . Hypertension Mother   . Heart attack Mother   . Hypertension Father   . Stroke Father   . Alcohol abuse Daughter   . Depression Daughter   . Drug abuse Daughter   . Alcohol abuse Son   . Depression Son   . Drug abuse Son   . CVA Maternal Grandmother   . Kidney disease  Neg Hx   . Hyperlipidemia Neg Hx   . Heart disease Neg Hx   . Early death Neg Hx   . Arthritis Neg Hx     Social History Social History   Tobacco Use  . Smoking status: Never Smoker  . Smokeless tobacco: Never Used  Substance Use Topics  . Alcohol use: No  . Drug use: No     Allergies   Codeine   Review of Systems Review of Systems  All other systems reviewed and are negative.    Physical Exam Updated Vital Signs BP (!) 101/48   Pulse 73   Temp (S) 100 F (37.8 C) (Oral) Comment (Src): Pt refused rectac temp.  Resp 15   Ht 5\' 2"  (1.575 m)   Wt 48.1 kg   SpO2 92%   BMI 19.39 kg/m   Physical Exam Vitals signs and nursing note reviewed.  Constitutional:      General: She is in acute distress.      Appearance: She is well-developed. She is ill-appearing and toxic-appearing. She is not diaphoretic.     Comments: Elderly, frail  HENT:     Head: Normocephalic and atraumatic.     Right Ear: External ear normal.     Left Ear: External ear normal.     Nose: No congestion or rhinorrhea.     Mouth/Throat:     Mouth: Mucous membranes are moist.     Pharynx: No oropharyngeal exudate or posterior oropharyngeal erythema.  Eyes:     Conjunctiva/sclera: Conjunctivae normal.     Pupils: Pupils are equal, round, and reactive to light.  Neck:     Musculoskeletal: Normal range of motion and neck supple.     Trachea: Phonation normal.  Cardiovascular:     Rate and Rhythm: Normal rate and regular rhythm.  Pulmonary:     Effort: Pulmonary effort is normal. No respiratory distress.     Breath sounds: Normal breath sounds. No stridor. No wheezing or rhonchi.  Chest:     Chest wall: No tenderness.  Abdominal:     General: There is no distension.     Palpations: Abdomen is soft.     Tenderness: There is no abdominal tenderness. There is no guarding.  Musculoskeletal: Normal range of motion.     Right lower leg: No edema.     Left lower leg: No edema.  Skin:    General: Skin is warm and dry.  Neurological:     Mental Status: She is alert and oriented to person, place, and time.     Motor: No abnormal muscle tone.  Psychiatric:        Mood and Affect: Mood normal.        Behavior: Behavior normal.      ED Treatments / Results  Labs (all labs ordered are listed, but only abnormal results are displayed) Labs Reviewed  URINALYSIS, ROUTINE W REFLEX MICROSCOPIC - Abnormal; Notable for the following components:      Result Value   Ketones, ur 20 (*)    Leukocytes, UA TRACE (*)    Bacteria, UA RARE (*)    All other components within normal limits  COMPREHENSIVE METABOLIC PANEL - Abnormal; Notable for the following components:   Glucose, Bld 132 (*)    Calcium 8.8 (*)    Total Protein 6.4  (*)    AST 54 (*)    All other components within normal limits  CBC WITH DIFFERENTIAL/PLATELET - Abnormal; Notable for the following components:  WBC 12.3 (*)    Platelets 136 (*)    Neutro Abs 11.1 (*)    Lymphs Abs 0.5 (*)    All other components within normal limits  LACTIC ACID, PLASMA  INFLUENZA PANEL BY PCR (TYPE A & B)    EKG None  Radiology Dg Chest 2 View  Result Date: 03/17/2018 CLINICAL DATA:  Cough, fever, body aches, and loss of appetite for 72 hours. EXAM: CHEST - 2 VIEW COMPARISON:  08/13/2015 FINDINGS: Cardiac enlargement. No vascular congestion, edema, or consolidation. No blunting of costophrenic angles. No pneumothorax. Mediastinal contours appear intact. Degenerative changes in the spine. No change since prior study. IMPRESSION: Cardiac enlargement. No evidence of active pulmonary disease. Electronically Signed   By: Burman Nieves M.D.   On: 03/17/2018 23:52    Procedures Procedures (including critical care time)  Medications Ordered in ED Medications  sodium chloride 0.9 % bolus 500 mL (0 mLs Intravenous Stopped 03/17/18 2307)  acetaminophen (TYLENOL) tablet 1,000 mg (1,000 mg Oral Given 03/17/18 2130)  oseltamivir (TAMIFLU) capsule 75 mg (75 mg Oral Given 03/17/18 2308)     Initial Impression / Assessment and Plan / ED Course  I have reviewed the triage vital signs and the nursing notes.  Pertinent labs & imaging results that were available during my care of the patient were reviewed by me and considered in my medical decision making (see chart for details).  Clinical Course as of Mar 19 11  Wed Mar 17, 2018  2254 Normal except presence of ketones and leukocytes.  Also increased red cells, and rare bacteria.  Urinalysis, Routine w reflex microscopic(!) [EW]  2304 Normal except white count elevated  CBC with Differential(!) [EW]  2305 Urinalysis, Routine w reflex microscopic(!) [EW]  2305 Normal except glucose elevated, calcium low, total protein low,  AST high  Comprehensive metabolic panel(!) [EW]  Thu Mar 18, 2018  0004 Normal  Influenza panel by PCR (type A & B) [EW]  0004 No infiltrate, or CHF, images reviewed by me  DG Chest 2 View [EW]    Clinical Course User Index [EW] Mancel Bale, MD     Patient Vitals for the past 24 hrs:  BP Temp Temp src Pulse Resp SpO2 Height Weight  03/17/18 2305 - 100 F (37.8 C) Oral - - - - -  03/17/18 2300 (!) 101/48 - - 73 15 92 % - -  03/17/18 1956 - (!) 103.5 F (39.7 C) Rectal - - - - -  03/17/18 1955 117/90 99.1 F (37.3 C) Oral 72 13 93 % 5\' 2"  (1.575 m) 48.1 kg    12:05 AM reevaluation with update and discussion. After initial assessment and treatment, an updated evaluation reveals patient is more alert and brighter at this time.  She is conversant.  Fever has improved.  Findings discussed with patient's daughter who feels like the patient is not yet stable for discharge.  Patient is agreeable to staying.Mancel Bale   Medical Decision Making: Patient presenting with fever and altered mental status.  She has dementia.  Screening for causes of fever is not revealing for the source.  No evidence for pneumonia, influenza, and urinary tract infection, meningitis or clinical evidence for skin or soft tissue infection.  Doubt intra-abdominal process.  Lactate normal.  Empiric Tamiflu begun prior to return of testing for influenza.  Patient's mental status improved to near normal after treatment of fever with Tylenol.  No antibiotics initiated.  CRITICAL CARE-yes Performed by: Mancel Bale  Nursing Notes Reviewed/ Care Coordinated Applicable Imaging Reviewed Interpretation of Laboratory Data incorporated into ED treatment   12:13 AM-Consult complete with hospitalist. Patient case explained and discussed.  He agrees to admit patient for further evaluation and treatment. Call ended at 12:20 AM  Plan-keep in hospital for observation  Final Clinical Impressions(s) / ED Diagnoses    Final diagnoses:  Febrile illness  Influenza-like illness    ED Discharge Orders    None       Mancel Bale, MD 03/18/18 0021

## 2018-03-18 ENCOUNTER — Encounter (HOSPITAL_COMMUNITY): Payer: Self-pay

## 2018-03-18 ENCOUNTER — Other Ambulatory Visit: Payer: Self-pay

## 2018-03-18 DIAGNOSIS — R509 Fever, unspecified: Secondary | ICD-10-CM | POA: Diagnosis not present

## 2018-03-18 LAB — BLOOD CULTURE ID PANEL (REFLEXED)
Acinetobacter baumannii: NOT DETECTED
CANDIDA KRUSEI: NOT DETECTED
Candida albicans: NOT DETECTED
Candida glabrata: NOT DETECTED
Candida parapsilosis: NOT DETECTED
Candida tropicalis: NOT DETECTED
Carbapenem resistance: NOT DETECTED
Enterobacter cloacae complex: NOT DETECTED
Enterobacteriaceae species: DETECTED — AB
Enterococcus species: NOT DETECTED
Escherichia coli: DETECTED — AB
Haemophilus influenzae: NOT DETECTED
Klebsiella oxytoca: NOT DETECTED
Klebsiella pneumoniae: NOT DETECTED
Listeria monocytogenes: NOT DETECTED
Neisseria meningitidis: NOT DETECTED
Proteus species: NOT DETECTED
Pseudomonas aeruginosa: NOT DETECTED
STAPHYLOCOCCUS SPECIES: NOT DETECTED
STREPTOCOCCUS PNEUMONIAE: NOT DETECTED
Serratia marcescens: NOT DETECTED
Staphylococcus aureus (BCID): NOT DETECTED
Streptococcus agalactiae: NOT DETECTED
Streptococcus pyogenes: NOT DETECTED
Streptococcus species: NOT DETECTED

## 2018-03-18 LAB — RESPIRATORY PANEL BY PCR
Adenovirus: NOT DETECTED
Bordetella pertussis: NOT DETECTED
Chlamydophila pneumoniae: NOT DETECTED
Coronavirus 229E: NOT DETECTED
Coronavirus HKU1: NOT DETECTED
Coronavirus NL63: NOT DETECTED
Coronavirus OC43: NOT DETECTED
Influenza A: NOT DETECTED
Influenza B: NOT DETECTED
METAPNEUMOVIRUS-RVPPCR: DETECTED — AB
Mycoplasma pneumoniae: NOT DETECTED
PARAINFLUENZA VIRUS 2-RVPPCR: NOT DETECTED
Parainfluenza Virus 1: NOT DETECTED
Parainfluenza Virus 3: NOT DETECTED
Parainfluenza Virus 4: NOT DETECTED
Respiratory Syncytial Virus: NOT DETECTED
Rhinovirus / Enterovirus: NOT DETECTED

## 2018-03-18 LAB — PROCALCITONIN: PROCALCITONIN: 11.43 ng/mL

## 2018-03-18 MED ORDER — SODIUM CHLORIDE 0.9 % IV SOLN
INTRAVENOUS | Status: AC
  Administered 2018-03-18: 19:00:00 via INTRAVENOUS

## 2018-03-18 MED ORDER — PROPRANOLOL HCL 20 MG PO TABS
20.0000 mg | ORAL_TABLET | Freq: Two times a day (BID) | ORAL | Status: DC
  Administered 2018-03-18: 20 mg via ORAL
  Filled 2018-03-18: qty 1

## 2018-03-18 MED ORDER — SODIUM CHLORIDE 0.9 % IV BOLUS
500.0000 mL | Freq: Once | INTRAVENOUS | Status: AC
  Administered 2018-03-18: 500 mL via INTRAVENOUS

## 2018-03-18 MED ORDER — SODIUM CHLORIDE 0.9 % IV SOLN
2.0000 g | INTRAVENOUS | Status: DC
  Administered 2018-03-18 – 2018-03-21 (×4): 2 g via INTRAVENOUS
  Filled 2018-03-18: qty 20
  Filled 2018-03-18 (×3): qty 2
  Filled 2018-03-18: qty 20

## 2018-03-18 MED ORDER — LACTATED RINGERS IV SOLN
INTRAVENOUS | Status: AC
  Administered 2018-03-18: 03:00:00 via INTRAVENOUS

## 2018-03-18 MED ORDER — ACETAMINOPHEN 325 MG PO TABS
650.0000 mg | ORAL_TABLET | Freq: Four times a day (QID) | ORAL | Status: DC | PRN
  Administered 2018-03-18 – 2018-03-24 (×3): 650 mg via ORAL
  Filled 2018-03-18 (×3): qty 2

## 2018-03-18 MED ORDER — SODIUM CHLORIDE 0.9% FLUSH
3.0000 mL | Freq: Two times a day (BID) | INTRAVENOUS | Status: DC
  Administered 2018-03-18 – 2018-03-24 (×7): 3 mL via INTRAVENOUS

## 2018-03-18 MED ORDER — TRAZODONE HCL 50 MG PO TABS
50.0000 mg | ORAL_TABLET | Freq: Every day | ORAL | Status: DC
  Administered 2018-03-18 – 2018-03-23 (×6): 50 mg via ORAL
  Filled 2018-03-18 (×6): qty 1

## 2018-03-18 MED ORDER — GUAIFENESIN-DM 100-10 MG/5ML PO SYRP
5.0000 mL | ORAL_SOLUTION | ORAL | Status: DC | PRN
  Administered 2018-03-18 – 2018-03-20 (×3): 5 mL via ORAL
  Filled 2018-03-18 (×4): qty 10

## 2018-03-18 MED ORDER — ENOXAPARIN SODIUM 40 MG/0.4ML ~~LOC~~ SOLN
40.0000 mg | Freq: Every day | SUBCUTANEOUS | Status: DC
  Administered 2018-03-18: 40 mg via SUBCUTANEOUS
  Filled 2018-03-18: qty 0.4

## 2018-03-18 MED ORDER — AZITHROMYCIN 250 MG PO TABS
500.0000 mg | ORAL_TABLET | Freq: Every day | ORAL | Status: DC
  Administered 2018-03-18: 500 mg via ORAL
  Filled 2018-03-18: qty 2

## 2018-03-18 MED ORDER — POLYETHYLENE GLYCOL 3350 17 G PO PACK
17.0000 g | PACK | Freq: Every day | ORAL | Status: DC
  Administered 2018-03-18 – 2018-03-24 (×5): 17 g via ORAL
  Filled 2018-03-18 (×7): qty 1

## 2018-03-18 MED ORDER — VENLAFAXINE HCL ER 150 MG PO CP24
150.0000 mg | ORAL_CAPSULE | Freq: Every day | ORAL | Status: DC
  Administered 2018-03-18 – 2018-03-24 (×7): 150 mg via ORAL
  Filled 2018-03-18: qty 1
  Filled 2018-03-18 (×6): qty 2

## 2018-03-18 MED ORDER — TRAZODONE HCL 50 MG PO TABS: 150.0000 mg | ORAL_TABLET | Freq: Every day | ORAL | Status: DC

## 2018-03-18 NOTE — Progress Notes (Signed)
At 0830, Rito Ehrlich, MD was notified regarding the pt's pulse of 117. Earlier this morning, it was reported that her pulse was in the 40. Upon further assessment, the pt's pulse is now in the 80's. I will continue to monitor the pt.

## 2018-03-18 NOTE — Progress Notes (Signed)
    Vital Signs MEWS/VS Documentation       03/18/2018 0834 03/18/2018 1334 03/18/2018 1458 03/18/2018 1502   MEWS Score:  1  2  2  3    MEWS Score Color:  Green  Yellow  Yellow  Yellow   Resp:  --  14  20  --   Pulse:  80  (!) 46  (!) 46  (!) 45   BP:  --  (!) 94/44  (!) 88/49  (!) 70/40   Temp:  --  98.2 F (36.8 C)  99.5 F (37.5 C)  --   O2 Device:  Room Air  Room [employer]  --   Level of Consciousness:  Alert  --  --  --       Rito Ehrlich, MD was made aware via page. MD responded at 1506 with a verbal order for an IV bolus of NS at over 30 minutes.       Marianna Fuss Lorin Glass 03/18/2018,3:06 PM

## 2018-03-18 NOTE — H&P (Signed)
History and Physical   Jeanne Lopez WJX:[id number] DOB: 11/18/1932 DOA: 03/17/2018  PCP: Merri Lopez, Candace, MD  Chief Complaint: Cough  HPI: This is a 85 year old woman with medical problems including dementia who is independent in her ADLs at baseline, anxiety/depression, migraine headaches, osteoporosis, insomnia who presents with a constellation of symptoms including upper airway congestion, cough, fever, and confusion.  History is obtained via chart review, discussion with the emergency medicine team, and the patient's daughter Jeanne PikesSusan who is at the bedside.  Of note, the patient is a limited/unreliable historian at the time of admission due to acute medical illness superimposed on what seems like mild dementia.  At baseline, the patient walks without assist devices, is independent in her ADLs, does require assistance with her IADLs.  She lives with her daughter Jeanne PikesSusan, is a retired Tourist information centre managerelementary school teacher.  She does not drink alcohol on a regular basis, is a never smoker.  She enjoys spending time with her grandchildren.  Over the past 3 days she has had upper airway congestion, rhinorrhea, productive cough.  On the morning prior to admission she had some abdominal discomfort, did not improve with carbonated beverage and Pepto-Bismol.  For the day she became less active and was disoriented and weak, her daughter reports that at baseline she has better memory and ability to communicate thing in the current time.  Other associated symptoms include nausea.  Denies dysuria, vomiting, diarrhea.  Does report she received the first dose of the Shingrix vaccine 5 to 7 days ago.  ED Course: In the emergency department vital signs remarkable for temperature of 103.5 Fahrenheit, normal heart rate, blood pressure 101/48.  A chest x-ray did not show acute pulmonary findings.  CBC with leukocytosis with a neutrophil predominance, total white count of 12,000; increased bands was noted on automated CBC count.  Lactic  acid within normal limits.  She was given Tamiflu prior to her influenza screen came back negative.  The patient was given 500 cc of normal saline as well as acetaminophen.  Hospital medicine was consulted for further management.  Review of Systems: A complete ROS was obtained; pertinent positives negatives are denoted in the HPI. Otherwise, all systems are negative.   Past Medical History:  Diagnosis Date  . Anxiety   . Depression   . Heart murmur   . Insomnia   . Memory disorder 04/02/2017  . Meniere's disease   . Migraine   . Osteoporosis    Social History   Socioeconomic History  . Marital status: Widowed    Spouse name: Not on file  . Number of children: 3  . Years of education: College  . Highest education level: Not on file  Occupational History  . Occupation: Retired  Engineer, productionocial Needs  . Financial resource strain: Not on file  . Food insecurity:    Worry: Not on file    Inability: Not on file  . Transportation needs:    Medical: Not on file    Non-medical: Not on file  Tobacco Use  . Smoking status: Never Smoker  . Smokeless tobacco: Never Used  Substance and Sexual Activity  . Alcohol use: No  . Drug use: No  . Sexual activity: Not Currently  Lifestyle  . Physical activity:    Days per week: Not on file    Minutes per session: Not on file  . Stress: Not on file  Relationships  . Social connections:    Talks on phone: Not on file    Gets  together: Not on file    Attends religious service: Not on file    Active member of club or organization: Not on file    Attends meetings of clubs or organizations: Not on file    Relationship status: Not on file  . Intimate partner violence:    Fear of current or ex partner: Not on file    Emotionally abused: Not on file    Physically abused: Not on file    Forced sexual activity: Not on file  Other Topics Concern  . Not on file  Social History Narrative   Lives with daughter and dog.    Grandchildren in frequently to  help her.    Retired.    Right handed    Caffeine use: Coffee every morning, 1 glass tea daily   Family History  Problem Relation Age of Onset  . Hypertension Brother   . Cancer Sister   . Hypertension Sister   . Hypertension Mother   . Heart attack Mother   . Hypertension Father   . Stroke Father   . Alcohol abuse Daughter   . Depression Daughter   . Drug abuse Daughter   . Alcohol abuse Son   . Depression Son   . Drug abuse Son   . CVA Maternal Grandmother   . Kidney disease Neg Hx   . Hyperlipidemia Neg Hx   . Heart disease Neg Hx   . Early death Neg Hx   . Arthritis Neg Hx     Physical Exam: Vitals:   03/17/18 1955 03/17/18 1956 03/17/18 2300 03/17/18 2305  BP: 117/90  (!) 101/48   Pulse: 72  73   Resp: 13  15   Temp: 99.1 F (37.3 C) (!) 103.5 F (39.7 C)  (S) 100 F (37.8 C)  TempSrc: Oral Rectal  (S) Oral  SpO2: 93%  92%   Weight: 48.1 kg     Height: 5\' 2"  (1.575 m)      General: Appears calm and comfortable, pleasant elderly white woman ENT: Grossly normal hearing, MMM. Cardiovascular: RRR. No M/R/G. No LE edema.   Respiratory: CTA bilaterally. No wheezes or crackles. Normal respiratory effort.  Breathing room air Abdomen: Soft, non-tender.  No guarding or rebound Skin: No rash or induration seen on limited exam. Musculoskeletal: Grossly normal tone BUE/BLE. Appropriate ROM.  Able to sit up without any assistance Psychiatric: Flat affect Neurologic: Not oriented to year or hospital, poor fund of knowledge, poor short-term memory; is alert  I have personally reviewed the following labs, culture data, and imaging studies.  Assessment/Plan:  #Sepsis due to viral source, suspected Course: symptoms developed ~3 days prior to admission with upper airway congestion and cough which progressed to cause confusion, increased from baseline.  Found to be febrile with WBC of 12,000 with left shift present.  CXR without infiltrate and influenza screen negative.  Lactic acid WNL. A/P: only localizing sign / symptom includes productive cough; however, CXR without evidence of overt pneumonia; clinical picture likely most consistent with viral illness.  Consideration for Shingrix vaccination side effect is also at play (fever can be as common as 28%). Discussed with the patient and daughter - will support with gentle IVF, obtain other diagnostics including RVP and procalcitonin. Low threshold to initiate bacterial antimicrobial coverage if she were to clinically decompensate.  #Other problems: -Anxiety / depression: will continue home SNRI -Migraine headaches: continue BB therapy for prophylaxis -Dementia: appears mild based on history, lives with daughter; hold donepezil for  now -Insomnia: continue home trazodone  DVT prophylaxis: Subq Lovenox Code Status: DNR / DNI based on discussion on admission Disposition Plan: Anticipate D/C home as early as within 24 hours; daughter - Jeanne Pikes requested telephone call in AM on 03/18/2018 by health care team [phone]) Consults called: none Admission status: admit to hospital medicine, floor level of care   Laurell Roof, MD [facility] Page:[phone]  If 7PM-7AM, please contact night-coverage [email or web address] Password [email or web address]  This document was created using the aid of voice recognition / dication software.

## 2018-03-18 NOTE — Progress Notes (Addendum)
   Vital Signs MEWS/VS Documentation       03/18/2018 1458 03/18/2018 1502 03/18/2018 1551 03/18/2018 1555   MEWS Score:  2  3  2  2    MEWS Score Color:  Yellow  Yellow  Yellow  Yellow   Resp:  20  -  15  -   Pulse:  (!) 46  (!) 45  (!) 49  -   BP:  (!) 88/49  (!) 70/40  (!) 84/49  (!) 82/40   Temp:  99.5 F (37.5 C)  -  99 F (37.2 C)  -   O2 Device:  Room Air  -  Room Air  -      Rito EhrlichKrishnan, MD was paged regarding the pt's BP and pulse after receiving a 500 ml bolus. Pt is asymptomatic. I will continue to monitor the pt.    MD placed orders for another bolus. See MAR.    Tilden FossaShaina M Dorris Pierre 03/18/2018,3:59 PM

## 2018-03-18 NOTE — Progress Notes (Signed)
   03/18/18 0534  Vitals  Pulse Rate (!) 47   On call physician notified. Awaiting orders.

## 2018-03-18 NOTE — Progress Notes (Addendum)
[facility] PROGRESS NOTE  Carollee MassedLila A Kromer ZOX:[id number] DOB: 01/17/1933 DOA: 03/17/2018  PCP: Merri BrunetteSmith, Candace, MD  Brief History/Interval Summary: 85 year old woman with medical problems including dementia who is independent in her ADLs at baseline, anxiety/depression, migraine headaches, osteoporosis, insomnia who presented with a constellation of symptoms including upper airway congestion, cough, fever, and confusion.    She was found to be febrile with a temperature of 103.5 F.  WBC was noted to be elevated.  No clear source of infection was identified.  Patient was hospitalized for further management.  Reason for Visit: Fever  Consultants: None  Procedures: None  Antibiotics: Will initiate azithromycin  Subjective/Interval History: Patient states that she feels better this morning.  Does not appear to be confused.  Seems to be back to baseline.  Denies nausea vomiting or diarrhea.  Does mention a cough with clear expectoration.  ROS: Denies any headaches  Objective:  Vital Signs  Vitals:   03/18/18 0432 03/18/18 0534 03/18/18 0756 03/18/18 0834  BP:  110/82 (!) 99/47   Pulse:  (!) 47 (!) 117 80  Resp:   18   Temp:  98.2 F (36.8 C) 98.2 F (36.8 C)   TempSrc:  Oral Oral   SpO2:  97% 100%   Weight: 51.3 kg     Height:        Intake/Output Summary (Last 24 hours) at 03/18/2018 1116 Last data filed at 03/18/2018 0935 Gross per 24 hour  Intake 991.15 ml  Output 301 ml  Net 690.15 ml   Filed Weights   03/17/18 1955 03/18/18 0432  Weight: 48.1 kg 51.3 kg    General appearance: alert, cooperative, appears stated age and no distress Head: Normocephalic, without obvious abnormality, atraumatic Resp: Normal effort at rest.  Coarse breath sounds bilaterally.  No obvious wheezing rales or rhonchi. Cardio: regular rate and rhythm, S1, S2 normal, no murmur, click, rub or gallop GI: soft, non-tender; bowel sounds normal; no masses,  no organomegaly Extremities:  extremities normal, atraumatic, no cyanosis or edema Neurologic: No obvious focal neurological deficits.  Lab Results:  Data Reviewed: I have personally reviewed following labs and imaging studies  CBC: Recent Labs  Lab 03/17/18 1944  WBC 12.3*  NEUTROABS 11.1*  HGB 14.0  HCT 44.4  MCV 97.6  PLT 136*    Basic Metabolic Panel: Recent Labs  Lab 03/17/18 1944  NA 137  K 3.7  CL 105  CO2 22  GLUCOSE 132*  BUN 18  CREATININE 0.73  CALCIUM 8.8*    GFR: Estimated Creatinine Clearance: 42.1 mL/min (by C-G formula based on SCr of 0.73 mg/dL).  Liver Function Tests: Recent Labs  Lab 03/17/18 1944  AST 54*  ALT 33  ALKPHOS 92  BILITOT 1.2  PROT 6.4*  ALBUMIN 3.7     Recent Results (from the past 240 hour(s))  Respiratory Panel by PCR     Status: Abnormal   Collection Time: 03/18/18  1:14 AM  Result Value Ref Range Status   Adenovirus NOT DETECTED NOT DETECTED Final   Coronavirus 229E NOT DETECTED NOT DETECTED Final    Comment: (NOTE) The Coronavirus on the Respiratory Panel, DOES NOT test for the novel  Coronavirus (2019 nCoV)    Coronavirus HKU1 NOT DETECTED NOT DETECTED Final   Coronavirus NL63 NOT DETECTED NOT DETECTED Final   Coronavirus OC43 NOT DETECTED NOT DETECTED Final   Metapneumovirus DETECTED (A) NOT DETECTED Final   Rhinovirus / Enterovirus NOT DETECTED NOT DETECTED Final  Influenza A NOT DETECTED NOT DETECTED Final   Influenza B NOT DETECTED NOT DETECTED Final   Parainfluenza Virus 1 NOT DETECTED NOT DETECTED Final   Parainfluenza Virus 2 NOT DETECTED NOT DETECTED Final   Parainfluenza Virus 3 NOT DETECTED NOT DETECTED Final   Parainfluenza Virus 4 NOT DETECTED NOT DETECTED Final   Respiratory Syncytial Virus NOT DETECTED NOT DETECTED Final   Bordetella pertussis NOT DETECTED NOT DETECTED Final   Chlamydophila pneumoniae NOT DETECTED NOT DETECTED Final   Mycoplasma pneumoniae NOT DETECTED NOT DETECTED Final    Comment: Performed at [facility], [address], [place], [place] [address]      Radiology Studies: Dg Chest 2 View  Result Date: 03/17/2018 CLINICAL DATA:  Cough, fever, body aches, and loss of appetite for 72 hours. EXAM: CHEST - 2 VIEW COMPARISON:  08/13/2015 FINDINGS: Cardiac enlargement. No vascular congestion, edema, or consolidation. No blunting of costophrenic angles. No pneumothorax. Mediastinal contours appear intact. Degenerative changes in the spine. No change since prior study. IMPRESSION: Cardiac enlargement. No evidence of active pulmonary disease. Electronically Signed   By: Burman Nieves M.D.   On: 03/17/2018 23:52     Medications:  Scheduled: . azithromycin  500 mg Oral Daily  . enoxaparin (LOVENOX) injection  40 mg Subcutaneous Daily  . polyethylene glycol  17 g Oral Daily  . sodium chloride flush  3 mL Intravenous Q12H  . traZODone  150 mg Oral QHS  . venlafaxine XR  150 mg Oral Daily   Continuous:  QGB:[id number]    Assessment/Plan:  Sepsis No obvious infectious source undefined.  Chest x-ray did not show any infiltrates.  UA does not suggest any infection either.  Respiratory viral panel is positive for metapneumovirus which could be a potential source.  However patient's procalcitonin level noted to be significantly elevated.  There could be a superimposed secondary bacterial component as well.  We will give her a course of azithromycin.  Monitor for additional 24 hours.  Recheck labs tomorrow.  Lactic acid level was normal.  Influenza panel was negative.  Patient also apparently recently received Shingrix vaccination which could have also caused the fever.  Acute metabolic encephalopathy in the setting of dementia Patient was noted to be more confused than usual.  Most likely due to the fever.  She appears to be back to baseline now.  Does not appear to have any neurological deficits.  History of anxiety and depression Continue home medications.  History of migraine  headaches She is on propranolol for prophylaxis.  Due to soft blood pressures we will hold it for today.  History of insomnia Trazodone.  ADDENDUM Patient's blood pressure dropped into the 70s this afternoon.  Patient asymptomatic.  Propranolol was held this morning.  Patient given fluid bolus with improvement in BP.  Patient seen at bedside.  She feels well.  Denies any dizziness or lightheadedness.  Daughter at the bedside.  Patient has borderline low blood pressures at baseline.  Blood culture positive for E. coli which could be another reason for her hypotension.  She will be started on ceftriaxone.  We will stop azithromycin.  Source of this E. coli is not clear.  UA was not significantly abnormal.  We will order a urine culture as well.  DVT Prophylaxis: Lovenox    Code Status: DNR Family Communication: No family at bedside Disposition Plan: Management as outlined above.  Mobilize.    LOS: 0 days   [employer]  [facility]  Pager on [email or web address]  03/18/2018, 11:16 AM

## 2018-03-18 NOTE — ED Notes (Signed)
ED TO INPATIENT HANDOFF REPORT  Name/Age/Gender Jeanne Lopez 85 y.o. female  Code Status    Code Status Orders  (From admission, onward)         Start     Ordered   03/18/18 0046  Do not attempt resuscitation (DNR)  Continuous    Question Answer Comment  In the event of cardiac or respiratory ARREST Do not call a "code blue"   In the event of cardiac or respiratory ARREST Do not perform Intubation, CPR, defibrillation or ACLS   In the event of cardiac or respiratory ARREST Use medication by any route, position, wound care, and other measures to relive pain and suffering. May use oxygen, suction and manual treatment of airway obstruction as needed for comfort.      03/18/18 0045        Code Status History    This patient has a current code status but no historical code status.      Home/SNF/Other Home  Chief Complaint Cough/Fever/Nausea  Level of Care/Admitting Diagnosis ED Disposition    ED Disposition Condition Comment   Ad[id number]  Level of Care: Med-Surg [16]  Diagnosis: Febrile illness [562563]  Admitting Physician: Elder Love [[id number]]  Attending Physician: Elder Love [[id number]]  PT Class (Do Not Modify): Observation [104]  PT Acc Code (Do Not Modify): Observation [10022]       Medical History Past Medical History:  Diagnosis Date  . Anxiety   . Depression   . Heart murmur   . Insomnia   . Memory disorder 04/02/2017  . Meniere's disease   . Migraine   . Osteoporosis     Allergies Allergies  Allergen Reactions  . Codeine Nausea And Vomiting    IV Location/Drains/Wounds Patient Lines/Drains/Airways Status   Active Line/Drains/Airways    Name:   Placement date:   Placement time:   Site:   Days:   Peripheral IV 03/17/18 Left Forearm   03/17/18    -    Forearm   1          Labs/Imaging Results for orders placed or performed during the hospital encounter of 03/17/18  (from the past 48 hour(s))  Lactic acid, plasma     Status: None   Collection Time: 03/17/18  7:44 PM  Result Value Ref Range   Lactic Acid, Venous 1.4 0.5 - 1.9 mmol/L    Comment: Performed at [facility], [address] [place], [place] [address]  Comprehensive metabolic panel     Status: Abnormal   Collection Time: 03/17/18  7:44 PM  Result Value Ref Range   Sodium 137 135 - 145 mmol/L   Potassium 3.7 3.5 - 5.1 mmol/L   Chloride 105 98 - 111 mmol/L   CO2 22 22 - 32 mmol/L   Glucose, Bld 132 (H) 70 - 99 mg/dL   BUN 18 8 - 23 mg/dL   Creatinine, Ser 3.55 0.44 - 1.00 mg/dL   Calcium 8.8 (L) 8.9 - 10.3 mg/dL   Total Protein 6.4 (L) 6.5 - 8.1 g/dL   Albumin 3.7 3.5 - 5.0 g/dL   AST 54 (H) 15 - 41 U/L   ALT 33 0 - 44 U/L   Alkaline Phosphatase 92 38 - 126 U/L   Total Bilirubin 1.2 0.3 - 1.2 mg/dL   GFR calc non Af Amer >60 >60 mL/min   GFR calc Af Amer >60 >60 mL/min   Anion gap  10 5 - 15    Comment: Performed at [facility], [address] [place], [place] [address]  CBC with Differential     Status: Abnormal   Collection Time: 03/17/18  7:44 PM  Result Value Ref Range   WBC 12.3 (H) 4.0 - 10.5 K/uL    Comment: WHITE COUNT CONFIRMED ON SMEAR   RBC 4.55 3.87 - 5.11 MIL/uL   Hemoglobin 14.0 12.0 - 15.0 g/dL   HCT 30.8 65.7 - 84.6 %   MCV 97.6 80.0 - 100.0 fL   MCH 30.8 26.0 - 34.0 pg   MCHC 31.5 30.0 - 36.0 g/dL   RDW 96.2 95.2 - 84.1 %   Platelets 136 (L) 150 - 400 K/uL    Comment: REPEATED TO VERIFY SPECIMEN CHECKED FOR CLOTS    nRBC 0.0 0.0 - 0.2 %   Neutrophils Relative % 90 %   Neutro Abs 11.1 (H) 1.7 - 7.7 K/uL   Lymphocytes Relative 4 %   Lymphs Abs 0.5 (L) 0.7 - 4.0 K/uL   Monocytes Relative 5 %   Monocytes Absolute 0.6 0.1 - 1.0 K/uL   Eosinophils Relative 0 %   Eosinophils Absolute 0.0 0.0 - 0.5 K/uL   Basophils Relative 0 %   Basophils Absolute 0.0 0.0 - 0.1 K/uL   WBC Morphology INCREASED BANDS (>20% BANDS)     Immature Granulocytes 1 %   Abs Immature Granulocytes 0.06 0.00 - 0.07 K/uL    Comment: Performed at [facility], [address] [place], [place] [address]  Urinalysis, Routine w reflex microscopic     Status: Abnormal   Collection Time: 03/17/18  9:51 PM  Result Value Ref Range   Color, Urine YELLOW YELLOW   APPearance CLEAR CLEAR   Specific Gravity, Urine 1.018 1.005 - 1.030   pH 5.0 5.0 - 8.0   Glucose, UA NEGATIVE NEGATIVE mg/dL   Hgb urine dipstick NEGATIVE NEGATIVE   Bilirubin Urine NEGATIVE NEGATIVE   Ketones, ur 20 (A) NEGATIVE mg/dL   Protein, ur NEGATIVE NEGATIVE mg/dL   Nitrite NEGATIVE NEGATIVE   Leukocytes, UA TRACE (A) NEGATIVE   RBC / HPF 6-10 0 - 5 RBC/hpf   WBC, UA 0-5 0 - 5 WBC/hpf   Bacteria, UA RARE (A) NONE SEEN   Squamous Epithelial / LPF 0-5 0 - 5   Mucus PRESENT    Hyaline Casts, UA PRESENT     Comment: Performed at [facility], [address] [place], [place] [address]  Influenza panel by PCR (type A & B)     Status: None   Collection Time: 03/17/18 11:13 PM  Result Value Ref Range   Influenza A By PCR NEGATIVE NEGATIVE   Influenza B By PCR NEGATIVE NEGATIVE    Comment: (NOTE) The Xpert Xpress Flu assay is intended as an aid in the diagnosis of  influenza and should not be used as a sole basis for treatment.  This  assay is FDA approved for nasopharyngeal swab specimens only. Nasal  washings and aspirates are unacceptable for Xpert Xpress Flu testing. Performed at [facility], [address] [place], [place] [address]    Dg Chest 2 View  Result Date: 03/17/2018 CLINICAL DATA:  Cough, fever, body aches, and loss of appetite for 72 hours. EXAM: CHEST - 2 VIEW COMPARISON:  08/13/2015 FINDINGS: Cardiac enlargement. No vascular congestion, edema, or consolidation. No blunting of costophrenic angles. No pneumothorax. Mediastinal contours appear intact. Degenerative changes  in the spine. No change  since prior study. IMPRESSION: Cardiac enlargement. No evidence of active pulmonary disease. Electronically Signed   By: Burman NievesWilliam  Stevens M.D.   On: 03/17/2018 23:52   None  Pending Labs Unresulted Labs (From admission, onward)    Start     Ordered   03/18/18 0103  Culture, blood (routine x 2)  BLOOD CULTURE X 2,   R     03/18/18 0102   03/18/18 0046  Respiratory Panel by PCR  (Respiratory virus panel with precautions)  Once,   R     03/18/18 0045   03/18/18 0046  Procalcitonin - Baseline  ONCE - STAT,   STAT     03/18/18 0045          Vitals/Pain Today's Vitals   03/17/18 2300 03/17/18 2305 03/18/18 0145 03/18/18 0155  BP: (!) 101/48  (!) 97/53   Pulse: 73  (!) 51   Resp: 15  16   Temp:  (S) 100 F (37.8 C)    TempSrc:  (S) Oral    SpO2: 92%  98%   Weight:      Height:      PainSc:    3     Isolation Precautions Droplet precaution  Medications Medications  propranolol (INDERAL) tablet 20 mg (20 mg Oral Not Given 03/18/18 0118)  traZODone (DESYREL) tablet 150 mg (150 mg Oral Not Given 03/18/18 0118)  venlafaxine XR (EFFEXOR-XR) 24 hr capsule 150 mg (has no administration in time range)  polyethylene glycol (MIRALAX / GLYCOLAX) packet 17 g (has no administration in time range)  enoxaparin (LOVENOX) injection 40 mg (has no administration in time range)  sodium chloride flush (NS) 0.9 % injection 3 mL (3 mLs Intravenous Not Given 03/18/18 0119)  lactated ringers infusion (has no administration in time range)  acetaminophen (TYLENOL) tablet 650 mg (has no administration in time range)  sodium chloride 0.9 % bolus 500 mL (0 mLs Intravenous Stopped 03/17/18 2307)  acetaminophen (TYLENOL) tablet 1,000 mg (1,000 mg Oral Given 03/17/18 2130)  oseltamivir (TAMIFLU) capsule 75 mg (75 mg Oral Given 03/17/18 2308)    Mobility walks

## 2018-03-18 NOTE — Progress Notes (Addendum)
At 1050, I was notified by infection prevention that the pt no longer requires droplet precautions and to d/c the contact precautions.   Rito Ehrlich, MD was notified.

## 2018-03-18 NOTE — Progress Notes (Signed)
PHARMACY - PHYSICIAN COMMUNICATION CRITICAL VALUE ALERT - BLOOD CULTURE IDENTIFICATION (BCID)  Jeanne Lopez is an 85 y.o. female who presented to [facility] on 03/17/2018 with a chief complaint of fever, cough and decreased appetite.  She was started on azithromycin on admission.  Name of physician (or Provider) Contacted: Dr. Harriet Masson  Current antibiotics: ceftriaxone 2gm IV q24h  Changes to prescribed antibiotics recommended:  - start ceftriaxone 2gm IV q24h - d/c azithromycin (per Dr. Rito Ehrlich)  Results for orders placed or performed during the hospital encounter of 03/17/18  Blood Culture ID Panel (Reflexed) (Collected: 03/18/2018  1:14 AM)  Result Value Ref Range   Enterococcus species NOT DETECTED NOT DETECTED   Listeria monocytogenes NOT DETECTED NOT DETECTED   Staphylococcus species NOT DETECTED NOT DETECTED   Staphylococcus aureus (BCID) NOT DETECTED NOT DETECTED   Streptococcus species NOT DETECTED NOT DETECTED   Streptococcus agalactiae NOT DETECTED NOT DETECTED   Streptococcus pneumoniae NOT DETECTED NOT DETECTED   Streptococcus pyogenes NOT DETECTED NOT DETECTED   Acinetobacter baumannii NOT DETECTED NOT DETECTED   Enterobacteriaceae species DETECTED (A) NOT DETECTED   Enterobacter cloacae complex NOT DETECTED NOT DETECTED   Escherichia coli DETECTED (A) NOT DETECTED   Klebsiella oxytoca NOT DETECTED NOT DETECTED   Klebsiella pneumoniae NOT DETECTED NOT DETECTED   Proteus species NOT DETECTED NOT DETECTED   Serratia marcescens NOT DETECTED NOT DETECTED   Carbapenem resistance NOT DETECTED NOT DETECTED   Haemophilus influenzae NOT DETECTED NOT DETECTED   Neisseria meningitidis NOT DETECTED NOT DETECTED   Pseudomonas aeruginosa NOT DETECTED NOT DETECTED   Candida albicans NOT DETECTED NOT DETECTED   Candida glabrata NOT DETECTED NOT DETECTED   Candida krusei NOT DETECTED NOT DETECTED   Candida parapsilosis NOT DETECTED NOT DETECTED   Candida tropicalis NOT  DETECTED NOT DETECTED    Uzziel Russey P 03/18/2018  5:22 PM

## 2018-03-19 ENCOUNTER — Inpatient Hospital Stay (HOSPITAL_COMMUNITY): Payer: Medicare Other

## 2018-03-19 DIAGNOSIS — K811 Chronic cholecystitis: Secondary | ICD-10-CM | POA: Diagnosis present

## 2018-03-19 DIAGNOSIS — T441X5A Adverse effect of other parasympathomimetics [cholinergics], initial encounter: Secondary | ICD-10-CM | POA: Diagnosis not present

## 2018-03-19 DIAGNOSIS — N390 Urinary tract infection, site not specified: Secondary | ICD-10-CM | POA: Diagnosis present

## 2018-03-19 DIAGNOSIS — R932 Abnormal findings on diagnostic imaging of liver and biliary tract: Secondary | ICD-10-CM | POA: Diagnosis not present

## 2018-03-19 DIAGNOSIS — R001 Bradycardia, unspecified: Secondary | ICD-10-CM | POA: Diagnosis not present

## 2018-03-19 DIAGNOSIS — A4151 Sepsis due to Escherichia coli [E. coli]: Secondary | ICD-10-CM | POA: Diagnosis present

## 2018-03-19 DIAGNOSIS — R509 Fever, unspecified: Secondary | ICD-10-CM | POA: Diagnosis present

## 2018-03-19 DIAGNOSIS — K2980 Duodenitis without bleeding: Secondary | ICD-10-CM | POA: Diagnosis present

## 2018-03-19 DIAGNOSIS — J208 Acute bronchitis due to other specified organisms: Secondary | ICD-10-CM | POA: Diagnosis present

## 2018-03-19 DIAGNOSIS — I313 Pericardial effusion (noninflammatory): Secondary | ICD-10-CM | POA: Diagnosis present

## 2018-03-19 DIAGNOSIS — B9781 Human metapneumovirus as the cause of diseases classified elsewhere: Secondary | ICD-10-CM | POA: Diagnosis present

## 2018-03-19 DIAGNOSIS — Q6101 Congenital single renal cyst: Secondary | ICD-10-CM | POA: Diagnosis not present

## 2018-03-19 DIAGNOSIS — Z66 Do not resuscitate: Secondary | ICD-10-CM | POA: Diagnosis present

## 2018-03-19 DIAGNOSIS — I959 Hypotension, unspecified: Secondary | ICD-10-CM

## 2018-03-19 DIAGNOSIS — E876 Hypokalemia: Secondary | ICD-10-CM | POA: Diagnosis not present

## 2018-03-19 DIAGNOSIS — D696 Thrombocytopenia, unspecified: Secondary | ICD-10-CM | POA: Diagnosis present

## 2018-03-19 DIAGNOSIS — I10 Essential (primary) hypertension: Secondary | ICD-10-CM | POA: Diagnosis present

## 2018-03-19 DIAGNOSIS — G43909 Migraine, unspecified, not intractable, without status migrainosus: Secondary | ICD-10-CM | POA: Diagnosis present

## 2018-03-19 DIAGNOSIS — F0390 Unspecified dementia without behavioral disturbance: Secondary | ICD-10-CM | POA: Diagnosis present

## 2018-03-19 DIAGNOSIS — G9341 Metabolic encephalopathy: Secondary | ICD-10-CM | POA: Diagnosis present

## 2018-03-19 DIAGNOSIS — M810 Age-related osteoporosis without current pathological fracture: Secondary | ICD-10-CM | POA: Diagnosis present

## 2018-03-19 DIAGNOSIS — A419 Sepsis, unspecified organism: Secondary | ICD-10-CM | POA: Diagnosis present

## 2018-03-19 DIAGNOSIS — F419 Anxiety disorder, unspecified: Secondary | ICD-10-CM | POA: Diagnosis present

## 2018-03-19 DIAGNOSIS — F329 Major depressive disorder, single episode, unspecified: Secondary | ICD-10-CM | POA: Diagnosis present

## 2018-03-19 DIAGNOSIS — Y92230 Patient room in hospital as the place of occurrence of the external cause: Secondary | ICD-10-CM | POA: Diagnosis not present

## 2018-03-19 DIAGNOSIS — T447X5A Adverse effect of beta-adrenoreceptor antagonists, initial encounter: Secondary | ICD-10-CM | POA: Diagnosis not present

## 2018-03-19 DIAGNOSIS — G4700 Insomnia, unspecified: Secondary | ICD-10-CM | POA: Diagnosis present

## 2018-03-19 DIAGNOSIS — R7881 Bacteremia: Secondary | ICD-10-CM | POA: Diagnosis not present

## 2018-03-19 DIAGNOSIS — Z9049 Acquired absence of other specified parts of digestive tract: Secondary | ICD-10-CM | POA: Diagnosis not present

## 2018-03-19 DIAGNOSIS — I700 Atherosclerosis of aorta: Secondary | ICD-10-CM | POA: Diagnosis present

## 2018-03-19 LAB — COMPREHENSIVE METABOLIC PANEL
ALT: 28 U/L (ref 0–44)
AST: 41 U/L (ref 15–41)
Albumin: 2.9 g/dL — ABNORMAL LOW (ref 3.5–5.0)
Alkaline Phosphatase: 72 U/L (ref 38–126)
Anion gap: 6 (ref 5–15)
BILIRUBIN TOTAL: 1.1 mg/dL (ref 0.3–1.2)
BUN: 20 mg/dL (ref 8–23)
CO2: 21 mmol/L — ABNORMAL LOW (ref 22–32)
Calcium: 7.5 mg/dL — ABNORMAL LOW (ref 8.9–10.3)
Chloride: 108 mmol/L (ref 98–111)
Creatinine, Ser: 0.73 mg/dL (ref 0.44–1.00)
GFR calc Af Amer: >60 mL/min (ref >60)
GFR calc non Af Amer: >60 mL/min (ref >60)
Glucose, Bld: 93 mg/dL (ref 70–99)
Potassium: 3.8 mmol/L (ref 3.5–5.1)
Sodium: 135 mmol/L (ref 135–145)
TOTAL PROTEIN: 5.3 g/dL — AB (ref 6.5–8.1)

## 2018-03-19 LAB — PROCALCITONIN: Procalcitonin: 13.37 ng/mL

## 2018-03-19 LAB — CBC
HCT: 37.5 % (ref 36.0–46.0)
Hemoglobin: 11.4 g/dL — ABNORMAL LOW (ref 12.0–15.0)
MCH: 30.7 pg (ref 26.0–34.0)
MCHC: 30.4 g/dL (ref 30.0–36.0)
MCV: 101.1 fL — ABNORMAL HIGH (ref 80.0–100.0)
Platelets: 90 10*3/uL — ABNORMAL LOW (ref 150–400)
RBC: 3.71 MIL/uL — ABNORMAL LOW (ref 3.87–5.11)
RDW: 15.3 % (ref 11.5–15.5)
WBC: 8.6 10*3/uL (ref 4.0–10.5)
nRBC: 0 % (ref 0.0–0.2)

## 2018-03-19 MED ORDER — PANTOPRAZOLE SODIUM 40 MG PO TBEC
40.0000 mg | DELAYED_RELEASE_TABLET | Freq: Every day | ORAL | Status: DC
  Administered 2018-03-19 – 2018-03-24 (×6): 40 mg via ORAL
  Filled 2018-03-19 (×6): qty 1

## 2018-03-19 MED ORDER — IOHEXOL 300 MG/ML  SOLN
15.0000 mL | Freq: Once | INTRAMUSCULAR | Status: DC | PRN
  Administered 2018-03-19: 15 mL via ORAL
  Filled 2018-03-19: qty 20

## 2018-03-19 MED ORDER — IOHEXOL 300 MG/ML  SOLN
100.0000 mL | Freq: Once | INTRAMUSCULAR | Status: AC | PRN
  Administered 2018-03-19: 100 mL via INTRAVENOUS

## 2018-03-19 MED ORDER — SODIUM CHLORIDE (PF) 0.9 % IJ SOLN
INTRAMUSCULAR | Status: AC
  Filled 2018-03-19: qty 50

## 2018-03-19 NOTE — Progress Notes (Addendum)
[facility] PROGRESS NOTE  Jeanne Lopez DJM:[id number] DOB: 1932-09-28 DOA: 03/17/2018  PCP: Merri Brunette, MD  Brief History/Interval Summary: 85 year old woman with medical problems including dementia who is independent in her ADLs at baseline, anxiety/depression, migraine headaches, osteoporosis, insomnia who presented with a constellation of symptoms including upper airway congestion, cough, fever, and confusion.    She was found to be febrile with a temperature of 103.5 F.  WBC was noted to be elevated.  No clear source of infection was identified.  Patient was hospitalized for further management.  Reason for Visit: Fever  Consultants: None  Procedures: None  Antibiotics: Given a dose of azithromycin on 2/6 Changed over to ceftriaxone due to bacteremia  Subjective/Interval History: Patient mentions that she feels well.  She is pleasantly confused due to her history of dementia.  According to the daughter who was at the bedside patient has been complaining of abdominal pain and nausea.  These were the symptoms that initially brought her into the hospital.    ROS: Denies any headaches.  Objective:  Vital Signs  Vitals:   03/18/18 2129 03/19/18 0500 03/19/18 0600 03/19/18 0850  BP: (!) 106/49  (!) 104/56 110/72  Pulse: 63  (!) 51 (!) 54  Resp: 15  16   Temp: 99 F (37.2 C)  98.6 F (37 C)   TempSrc: Oral  Oral   SpO2: 93%  90%   Weight:  52.5 kg    Height:        Intake/Output Summary (Last 24 hours) at 03/19/2018 1048 Last data filed at 03/19/2018 0954 Gross per 24 hour  Intake 2890.51 ml  Output 250 ml  Net 2640.51 ml   Filed Weights   03/17/18 1955 03/18/18 0432 03/19/18 0500  Weight: 48.1 kg 51.3 kg 52.5 kg   General appearance: Awake alert.  In no distress.  Distracted Resp: Clear to auscultation bilaterally.  Normal effort Cardio: S1-S2 is normal regular.  No S3-S4.  No rubs murmurs or bruit GI: Diminished soft.  Vague discomfort noted  diffusely without any rebound rigidity or guarding.  No masses organomegaly.   Extremities: No edema.  Full range of motion of lower extremities. Neurologic: She is alert.  Pleasantly confused.  No focal neurological deficits.   Lab Results:  Data Reviewed: I have personally reviewed following labs and imaging studies  CBC: Recent Labs  Lab 03/17/18 1944 03/19/18 0517  WBC 12.3* 8.6  NEUTROABS 11.1*  --   HGB 14.0 11.4*  HCT 44.4 37.5  MCV 97.6 101.1*  PLT 136* 90*    Basic Metabolic Panel: Recent Labs  Lab 03/17/18 1944 03/19/18 0517  NA 137 135  K 3.7 3.8  CL 105 108  CO2 22 21*  GLUCOSE 132* 93  BUN 18 20  CREATININE 0.73 0.73  CALCIUM 8.8* 7.5*    GFR: Estimated Creatinine Clearance: 42.1 mL/min (by C-G formula based on SCr of 0.73 mg/dL).  Liver Function Tests: Recent Labs  Lab 03/17/18 1944 03/19/18 0517  AST 54* 41  ALT 33 28  ALKPHOS 92 72  BILITOT 1.2 1.1  PROT 6.4* 5.3*  ALBUMIN 3.7 2.9*     Recent Results (from the past 240 hour(s))  Respiratory Panel by PCR     Status: Abnormal   Collection Time: 03/18/18  1:14 AM  Result Value Ref Range Status   Adenovirus NOT DETECTED NOT DETECTED Final   Coronavirus 229E NOT DETECTED NOT DETECTED Final    Comment: (NOTE) The Coronavirus  on the Respiratory Panel, DOES NOT test for the novel  Coronavirus (2019 nCoV)    Coronavirus HKU1 NOT DETECTED NOT DETECTED Final   Coronavirus NL63 NOT DETECTED NOT DETECTED Final   Coronavirus OC43 NOT DETECTED NOT DETECTED Final   Metapneumovirus DETECTED (A) NOT DETECTED Final   Rhinovirus / Enterovirus NOT DETECTED NOT DETECTED Final   Influenza A NOT DETECTED NOT DETECTED Final   Influenza B NOT DETECTED NOT DETECTED Final   Parainfluenza Virus 1 NOT DETECTED NOT DETECTED Final   Parainfluenza Virus 2 NOT DETECTED NOT DETECTED Final   Parainfluenza Virus 3 NOT DETECTED NOT DETECTED Final   Parainfluenza Virus 4 NOT DETECTED NOT DETECTED Final    Respiratory Syncytial Virus NOT DETECTED NOT DETECTED Final   Bordetella pertussis NOT DETECTED NOT DETECTED Final   Chlamydophila pneumoniae NOT DETECTED NOT DETECTED Final   Mycoplasma pneumoniae NOT DETECTED NOT DETECTED Final    Comment: Performed at [facility], [address] [place], [place] [address]  Culture, blood (routine x 2)     Status: Abnormal (Preliminary result)   Collection Time: 03/18/18  1:14 AM  Result Value Ref Range Status   Specimen Description   Final    BLOOD BLOOD LEFT FOREARM Performed at [facility], [address] [place], [place] [address]    Special Requests   Final    BOTTLES DRAWN AEROBIC ONLY Blood Culture adequate volume Performed at [facility], [address] [place], [place] [address]    Culture  Setup Time   Final    GRAM NEGATIVE RODS AEROBIC BOTTLE ONLY Organism ID to follow CRITICAL RESULT CALLED TO, READ BACK BY AND VERIFIED WITH: Fenton FoyA. Pham PharmD 17:20 03/18/18 ([email or web address])    Culture (A)  Final    ESCHERICHIA COLI SUSCEPTIBILITIES TO FOLLOW Performed at [facility], [address] [place], [place] [address]    Report Status PENDING  Incomplete  Culture, blood (routine x 2)     Status: None (Preliminary result)   Collection Time: 03/18/18  1:14 AM  Result Value Ref Range Status   Specimen Description   Final    BLOOD BLOOD RIGHT HAND Performed at [facility], [address] [place], [place] [address]    Special Requests   Final    BOTTLES DRAWN AEROBIC ONLY Blood Culture adequate volume Performed at [facility], [address] [place], [place] [address]    Culture  Setup Time   Final    GRAM NEGATIVE RODS AEROBIC BOTTLE ONLY CRITICAL VALUE NOTED.  VALUE IS CONSISTENT WITH PREVIOUSLY REPORTED AND CALLED VALUE. Performed at [facility], [address] [place], [place] [address]    Culture GRAM NEGATIVE RODS  Final   Report Status  PENDING  Incomplete  Blood Culture ID Panel (Reflexed)     Status: Abnormal   Collection Time: 03/18/18  1:14 AM  Result Value Ref Range Status   Enterococcus species NOT DETECTED NOT DETECTED Final   Listeria monocytogenes NOT DETECTED NOT DETECTED Final   Staphylococcus species NOT DETECTED NOT DETECTED Final   Staphylococcus aureus (BCID) NOT DETECTED NOT DETECTED Final   Streptococcus species NOT DETECTED NOT DETECTED Final   Streptococcus agalactiae NOT DETECTED NOT DETECTED Final   Streptococcus pneumoniae NOT DETECTED NOT DETECTED Final   Streptococcus pyogenes NOT DETECTED NOT DETECTED Final   Acinetobacter baumannii NOT DETECTED NOT DETECTED Final   Enterobacteriaceae species DETECTED (A) NOT DETECTED  Final    Comment: Enterobacteriaceae represent a large family of gram-negative bacteria, not a single organism. CRITICAL RESULT CALLED TO, READ BACK BY AND VERIFIED WITH: Fenton Foy PharmD 17:20 03/18/18 ([email or web address])    Enterobacter cloacae complex NOT DETECTED NOT DETECTED Final   Escherichia coli DETECTED (A) NOT DETECTED Final    Comment: CRITICAL RESULT CALLED TO, READ BACK BY AND VERIFIED WITH: Fenton Foy PharmD 17:20 03/18/18 ([email or web address])    Klebsiella oxytoca NOT DETECTED NOT DETECTED Final   Klebsiella pneumoniae NOT DETECTED NOT DETECTED Final   Proteus species NOT DETECTED NOT DETECTED Final   Serratia marcescens NOT DETECTED NOT DETECTED Final   Carbapenem resistance NOT DETECTED NOT DETECTED Final   Haemophilus influenzae NOT DETECTED NOT DETECTED Final   Neisseria meningitidis NOT DETECTED NOT DETECTED Final   Pseudomonas aeruginosa NOT DETECTED NOT DETECTED Final   Candida albicans NOT DETECTED NOT DETECTED Final   Candida glabrata NOT DETECTED NOT DETECTED Final   Candida krusei NOT DETECTED NOT DETECTED Final   Candida parapsilosis NOT DETECTED NOT DETECTED Final   Candida tropicalis NOT DETECTED NOT DETECTED Final    Comment: Performed at [facility], [address] [place], [place] [address]      Radiology Studies: Dg Chest 2 View  Result Date: 03/17/2018 CLINICAL DATA:  Cough, fever, body aches, and loss of appetite for 72 hours. EXAM: CHEST - 2 VIEW COMPARISON:  08/13/2015 FINDINGS: Cardiac enlargement. No vascular congestion, edema, or consolidation. No blunting of costophrenic angles. No pneumothorax. Mediastinal contours appear intact. Degenerative changes in the spine. No change since prior study. IMPRESSION: Cardiac enlargement. No evidence of active pulmonary disease. Electronically Signed   By: Burman Nieves M.D.   On: 03/17/2018 23:52     Medications:  Scheduled: . polyethylene glycol  17 g Oral Daily  . sodium chloride flush  3 mL Intravenous Q12H  . traZODone  50 mg Oral QHS  . venlafaxine XR  150 mg Oral Daily   Continuous: . cefTRIAXone (ROCEPHIN)  IV Stopped (03/18/18 1914)   HQR:[id number], guaiFENesin-dextromethorphan    Assessment/Plan:  Sepsis likely secondary to E. coli bacteremia Blood cultures positive for E. coli.  Respiratory viral panel positive for metapneumovirus.  Patient did have some GI symptoms at presentation.  Continues to have nausea and vague abdominal discomfort.  UA did not show any clear evidence for UTI.  Urine culture is pending.  Unclear why she has E. coli bacteremia.  LFTs are normal.  Proceed with CT scan of the abdomen and pelvis.  Procalcitonin noted to be significantly elevated at 13.  WBC is better today.  Lactic acid level was normal.  Influenza panel was negative.   Hypotension Patient had drop in blood pressure yesterday.  Her propranolol was discontinued.  Patient was given IV fluid boluses with improvement in blood pressure.  She was never symptomatic.  Sinus bradycardia Could be due to the propranolol.  Could also be due to Aricept.  Heart rate is stable.  Thrombocytopenia Drop in platelet counts noted.  No evidence for bleeding.  We will discontinue her Lovenox.  Recheck counts  tomorrow.  Acute metabolic encephalopathy in the setting of dementia Patient was noted to be more confused than usual at the time of admission.  Most likely due to the fever.  She appears to be back to baseline now.  Does not appear to have any neurological deficits.  History of anxiety and depression Continue home medications.  History of migraine headaches She  is on propranolol for prophylaxis.  Due to soft blood pressures we will hold it for today.  History of insomnia Trazodone.  History of dementia Aricept on hold due to bradycardia.  DVT Prophylaxis: Lovenox discontinued due to thrombocytopenia.  SCDs. Code Status: DNR Family Communication: Discussed with patient and her daughter who was at the bedside. Disposition Plan: Continue to mobilize.  Patient with E. coli bacteremia.  Source unclear.  CT scan of the abdomen pelvis to be done today.    LOS: 0 days   Krystl Wickware [employer] on [email or web address]  03/19/2018, 10:48 AM

## 2018-03-19 NOTE — Progress Notes (Signed)
   03/19/18 1010  Clinical Encounter Type  Visited With Patient and family together  Visit Type Initial  Referral From Nurse  Consult/Referral To Chaplain  The chaplain responded to consult for Pt. Prayer.  The chaplain checked in with the Pt. RN-Mandy. The Pt. was asleep at time of the chaplain visit. The chaplain was pastorally present with Pt. daughter [name].  [name] shared, "my mother and I started our day with a devotion."  [name] added the Pt. is a life member of [employer].  The chaplain heard through preparing for prayer, the Pt. daughter's request for God's presence and guidance in the Pt. and family's healthcare steps.  The chaplain offered F/U spiritual care.

## 2018-03-20 DIAGNOSIS — R001 Bradycardia, unspecified: Secondary | ICD-10-CM

## 2018-03-20 DIAGNOSIS — R7881 Bacteremia: Secondary | ICD-10-CM

## 2018-03-20 DIAGNOSIS — R932 Abnormal findings on diagnostic imaging of liver and biliary tract: Secondary | ICD-10-CM

## 2018-03-20 DIAGNOSIS — E876 Hypokalemia: Secondary | ICD-10-CM

## 2018-03-20 LAB — COMPREHENSIVE METABOLIC PANEL
ALT: 28 U/L (ref 0–44)
AST: 38 U/L (ref 15–41)
Albumin: 3 g/dL — ABNORMAL LOW (ref 3.5–5.0)
Alkaline Phosphatase: 86 U/L (ref 38–126)
Anion gap: 9 (ref 5–15)
BUN: 12 mg/dL (ref 8–23)
CHLORIDE: 109 mmol/L (ref 98–111)
CO2: 21 mmol/L — AB (ref 22–32)
Calcium: 8.2 mg/dL — ABNORMAL LOW (ref 8.9–10.3)
Creatinine, Ser: 0.63 mg/dL (ref 0.44–1.00)
GFR calc Af Amer: >60 mL/min (ref >60)
GFR calc non Af Amer: >60 mL/min (ref >60)
Glucose, Bld: 94 mg/dL (ref 70–99)
Potassium: 3.3 mmol/L — ABNORMAL LOW (ref 3.5–5.1)
Sodium: 139 mmol/L (ref 135–145)
Total Bilirubin: 0.8 mg/dL (ref 0.3–1.2)
Total Protein: 5.7 g/dL — ABNORMAL LOW (ref 6.5–8.1)

## 2018-03-20 LAB — CBC
HCT: 39.2 % (ref 36.0–46.0)
Hemoglobin: 12.4 g/dL (ref 12.0–15.0)
MCH: 31.4 pg (ref 26.0–34.0)
MCHC: 31.6 g/dL (ref 30.0–36.0)
MCV: 99.2 fL (ref 80.0–100.0)
Platelets: 108 10*3/uL — ABNORMAL LOW (ref 150–400)
RBC: 3.95 MIL/uL (ref 3.87–5.11)
RDW: 15.2 % (ref 11.5–15.5)
WBC: 6.2 10*3/uL (ref 4.0–10.5)
nRBC: 0 % (ref 0.0–0.2)

## 2018-03-20 LAB — CULTURE, BLOOD (ROUTINE X 2)
SPECIAL REQUESTS: ADEQUATE
Special Requests: ADEQUATE

## 2018-03-20 LAB — TSH: TSH: 3.874 u[IU]/mL (ref 0.350–4.500)

## 2018-03-20 LAB — T4, FREE: Free T4: 1.16 ng/dL (ref 0.82–1.77)

## 2018-03-20 LAB — PROCALCITONIN: Procalcitonin: 7.42 ng/mL

## 2018-03-20 MED ORDER — IPRATROPIUM-ALBUTEROL 0.5-2.5 (3) MG/3ML IN SOLN
3.0000 mL | Freq: Four times a day (QID) | RESPIRATORY_TRACT | Status: DC
  Administered 2018-03-20: 3 mL via RESPIRATORY_TRACT
  Filled 2018-03-20: qty 3

## 2018-03-20 MED ORDER — IPRATROPIUM-ALBUTEROL 0.5-2.5 (3) MG/3ML IN SOLN
3.0000 mL | Freq: Two times a day (BID) | RESPIRATORY_TRACT | Status: DC
  Administered 2018-03-20 – 2018-03-22 (×3): 3 mL via RESPIRATORY_TRACT
  Filled 2018-03-20 (×4): qty 3

## 2018-03-20 MED ORDER — ALBUTEROL SULFATE (2.5 MG/3ML) 0.083% IN NEBU: 2.5000 mg | INHALATION_SOLUTION | RESPIRATORY_TRACT | Status: DC | PRN

## 2018-03-20 MED ORDER — POTASSIUM CHLORIDE IN NACL 20-0.45 MEQ/L-% IV SOLN
INTRAVENOUS | Status: DC
  Administered 2018-03-20 (×2): via INTRAVENOUS
  Filled 2018-03-20 (×2): qty 1000

## 2018-03-20 MED ORDER — GUAIFENESIN ER 600 MG PO TB12
600.0000 mg | ORAL_TABLET | Freq: Two times a day (BID) | ORAL | Status: DC
  Administered 2018-03-20 – 2018-03-24 (×9): 600 mg via ORAL
  Filled 2018-03-20 (×10): qty 1

## 2018-03-20 NOTE — Consult Note (Signed)
Re:   Jeanne MassedLila A Lopez DOB:   01/07/1933 MRN:   [id number]  Chief Complaint Sepsis, possibly secondary to gall bladder  ASSESEMENT AND PLAN: 1.  Possible underlying gall bladder disease  CT scan and US both are suggestive of gall bladder disease - though there are no obvious stones.  He labs look good.  And, at least on my exam, her abdomen is benign.  Will plan HIDA scan - this should clarify whether the gall bladder is diseased and is the source of her E. Coli bacteremia.   I discussed this thoroughly with the patient and her daughter.   2.  Pulmonary congestion  Respiratory viral panel positive for metapneumovirus  She still has a significant cough.  3.  E. Coli bacteremia  On Ceftriaxone 4.  Thrombocytopenia  Platelets - 108,000 - 03/20/2018 5.  Dementia 6.  Hypokalemia  K+ - 3.3 - 03/20/2018 7.  Aortic atherosclerosis 8.  Migraines  Sees Dr. Corrinne EagleK. Lopez for dementia and migraines   Chief Complaint  Patient presents with  . Fever  . Nausea   PHYSICIAN REQUESTING CONSULTATION:  Dr. Osvaldo ShipperGokul Lopez, [facility] hospitalist  HISTORY OF PRESENT ILLNESS: Jeanne Lopez is a 85 y.o. (DOB: 01/01/1933)  white female whose primary care physician is Jeanne Lopez. Her daughter, Jeanne Lopez, is in the room with her.   She saw her Dr. Katrinka BlazingSmith on Thursday, 03/11/2017 for a regular annual physical and was doing well at that timer.  She had lab work drawn which was normal except an elevated cholesterol.  Then, over the weekend of 03/13/2018, she developed some respiratory symptoms.  These were moderate.  But on Wednesday, 03/17/2018, she had some abdominal pain.  Her symptoms were helped by peptobismol and a coke.  But she felt worse and [facility].  [facility] for a fever of 103.5, cough, abdominal discomfort, disoriented, and weak.  She received her first dose of Shingrix about 5 days before admission.  She was admitted for sepsis, but without  a clear source, but possibly pulmonary.  She had blood cultures that came back positive for E. Coli and Enterobacteriacae.  She has no GI history - no history of stomach, liver, gall bladder, pancreas, or colon disease.  She had an open appendectomy as a child and she had her last colonoscopy about 2013 (unknown physician).  Further evaluation - CT scan of abdomen - 03/19/2018 - 1. There are inflammatory changes centered around the gallbladder tracking along the gastrohepatic ligament and adjacent to the first and second portions of the duodenum. Findings Lopez be due to acute cholecystitis or duodenitis. Findings could potentially be due to liver inflammation. There is no evidence of pancreatitis.  2. No other acute abnormality within the abdomen or pelvis.  3. Small pleural effusions, small pericardial effusion and lower lobe, lung base opacities. Lower lobe opacities are most likely atelectasis. Pneumonia is possible. There is bilateral lower lobe bronchial wall thickening, which could reflect acute or chronic inflammation.  4. Chronic findings including aortic atherosclerosis and a left renal cyst.  US of abdomen - 03/20/2018 - 1. Diffuse gallbladder wall thickening, small amount of sludge in the gallbladder and positive sonographic Murphy sign. This could be due to acute or chronic acalculous cholecystitis. Secondary inflammation due to hepatitis or duodenitis is less likely but not excluded based on the CT appearance.   Past Medical History:  Diagnosis Date  .  Anxiety   . Depression   . Heart murmur   . Insomnia   . Memory disorder 04/02/2017  . Meniere's disease   . Migraine   . Osteoporosis       Past Surgical History:  Procedure Laterality Date  . APPENDECTOMY    . BREAST LUMPECTOMY Right   . DILATION AND CURETTAGE OF UTERUS        Current Facility-Administered Medications  Medication Dose Route Frequency Provider Last Rate Last Dose  . 0.45 % NaCl with KCl 20 mEq / L infusion    Intravenous Continuous Jeanne Shipper, Lopez 75 mL/hr at 03/20/18 0748    . acetaminophen (TYLENOL) tablet 650 mg  650 mg Oral Q6H PRN Jeanne Lopez   650 mg at 03/18/18 0759  . albuterol (PROVENTIL) (2.5 MG/3ML) 0.083% nebulizer solution 2.5 mg  2.5 mg Nebulization Q4H PRN Jeanne Shipper, Lopez      . cefTRIAXone (ROCEPHIN) 2 g in sodium chloride 0.9 % 100 mL IVPB  2 g Intravenous Q24H Jeanne Lopez 200 mL/hr at 03/19/18 1801 2 g at 03/19/18 1801  . guaiFENesin (MUCINEX) 12 hr tablet 600 mg  600 mg Oral BID Jeanne Shipper, Lopez   600 mg at 03/20/18 1117  . guaiFENesin-dextromethorphan (ROBITUSSIN DM) 100-10 MG/5ML syrup 5 mL  5 mL Oral Q4H PRN Jeanne Shipper, Lopez   5 mL at 03/20/18 0349  . iohexol (OMNIPAQUE) 300 MG/ML solution 15 mL  15 mL Oral Once PRN Jeanne Shipper, Lopez   15 mL at 03/19/18 1100  . ipratropium-albuterol (DUONEB) 0.5-2.5 (3) MG/3ML nebulizer solution 3 mL  3 mL Nebulization BID Jeanne Shipper, Lopez      . pantoprazole (PROTONIX) EC tablet 40 mg  40 mg Oral Q1200 Jeanne Shipper, Lopez   40 mg at 03/20/18 1117  . polyethylene glycol (MIRALAX / GLYCOLAX) packet 17 g  17 g Oral Daily Jeanne Lopez   17 g at 03/18/18 0802  . sodium chloride flush (NS) 0.9 % injection 3 mL  3 mL Intravenous Q12H Jeanne Lopez   3 mL at 03/19/18 2157  . traZODone (DESYREL) tablet 50 mg  50 mg Oral QHS Jeanne Shipper, Lopez   50 mg at 03/19/18 2157  . venlafaxine XR (EFFEXOR-XR) 24 hr capsule 150 mg  150 mg Oral Daily Jeanne Lopez   150 mg at 03/20/18 1117      Allergies  Allergen Reactions  . Codeine Nausea And Vomiting    REVIEW OF SYSTEMS: Skin:  No history of rash.  No history of abnormal moles. Infection:  No history of hepatitis or HIV.  No history of MRSA. Neurologic:  Dementia.  Migraines.  Followed by Jeanne Lopez. Cardiac:  No history of hypertension. No history of heart disease.  No history of prior cardiac catheterization.  No history of seeing a  cardiologist. Pulmonary:  Recent pulmonary disease.  Test positive for metapneumovirus.  Endocrine:  No diabetes. No thyroid disease. Gastrointestinal:  See HPI Urologic:  No history of kidney stones.  No history of bladder infections. Musculoskeletal:  No history of joint or back disease. Hematologic:  No bleeding disorder.  No history of anemia.  Not anticoagulated. Psycho-social:  The patient is oriented.   The patient has no obvious psychologic or social impairment to understanding our conversation and plan.  SOCIAL and FAMILY HISTORY: Widow. Lives with daughter, [name] Lopez. She also has a son: Chanetta Marshall, and another daughter.  Her father had  gall bladder surgery and her granddaughter ([name]'s daughter) had gall bladder surgery.  PHYSICAL EXAM: BP (!) 144/69   Pulse (!) 53   Temp 98.3 F (36.8 C) (Oral)   Resp 15   Ht 5\' 2"  (1.575 m)   Wt 54.2 kg   SpO2 96%   BMI 21.85 kg/m   General: WN older WF who is alert and generally healthy appearing.   But she is a poor historian.  Her daughter gave much of the history. Skin:  Inspection and palpation - no mass or rash. Eyes:  Conjunctiva and lids unremarkable.            Pupils are equal Ears, Nose, Mouth, and Throat:  Ears and nose unremarkable            Lips and teeth are unremarable. Neck: Supple. No mass, trachea midline.  No thyroid mass. Lymph Nodes:  No supraclavicular, cervical, or inguinal nodes. Lungs: Normal respiratory effort.  Clear to auscultation and symmetric breath sounds. Heart:  Palpation of the heart is normal.            Auscultation: RRR. No murmur or rub.  Abdomen: Soft. No mass. No tenderness. No hernia.             Normal bowel sounds.  RLQ scar.  No localized symptoms. Rectal: Not done. Musculoskeletal:  Normal gait.            Good muscle strength and ROM  in upper and lower extremities.  Neurologic:  Grossly intact to motor and sensory function. Psychiatric: Normal judgement and insight. Behavior is  normal.            She realizes that she has some memory issues.  DATA REVIEWED, COUNSELING AND COORDINATION OF CARE: Epic notes reviewed. Counseling and coordination of care exceeded more than 50% of the time spent with patient. Total time spent with patient and charting: 50 minutes.  Ovidio Kinavid Aasir Daigler, Lopez,  [facility] [address]  [address]   [place], [place]    [id number] Phone:  [phone] FAX:  [phone]

## 2018-03-20 NOTE — Progress Notes (Signed)
[facility] PROGRESS NOTE  Jeanne Lopez XBJ:[id number] DOB: 20-Oct-1932 DOA: 03/17/2018  PCP: Merri Brunette, MD  Brief History/Interval Summary: 85 year old woman with medical problems including dementia who is independent in her ADLs at baseline, anxiety/depression, migraine headaches, osteoporosis, insomnia who presented with a constellation of symptoms including upper airway congestion, cough, fever, and confusion.    She was found to be febrile with a temperature of 103.5 F.  WBC was noted to be elevated.  No clear source of infection was identified.  Patient was hospitalized for further management.  Reason for Visit: E. coli bacteremia.  Likely biliary source.  Consultants: General surgery  Procedures: None  Antibiotics: Given a dose of azithromycin on 2/6 Changed over to ceftriaxone due to bacteremia  Subjective/Interval History: Patient is a poor historian due to her history of dementia.  Patient's daughter is at the bedside.  Continues to have some nausea but no vomiting.  Continues to have some abdominal discomfort.  Appetite remains poor.    ROS: Denies any headaches.  Objective:  Vital Signs  Vitals:   03/19/18 1308 03/19/18 2134 03/20/18 0538 03/20/18 0640  BP: 119/64 (!) 153/92 136/83   Pulse: (!) 43 (!) 53 (!) 47   Resp: 12 14    Temp: 97.7 F (36.5 C) 98.5 F (36.9 C) 99 F (37.2 C)   TempSrc:  Oral Oral   SpO2: 98% 92% 94%   Weight:    54.2 kg  Height:        Intake/Output Summary (Last 24 hours) at 03/20/2018 1153 Last data filed at 03/20/2018 3086 Gross per 24 hour  Intake 420 ml  Output 100 ml  Net 320 ml   Filed Weights   03/18/18 0432 03/19/18 0500 03/20/18 0640  Weight: 51.3 kg 52.5 kg 54.2 kg   General appearance: Awake alert.  In no distress.  Mildly distracted. Resp: Clear to auscultation bilaterally.  Normal effort Cardio: S1-S2 is normal regular.  No S3-S4.  No rubs murmurs or bruit GI: Abdomen is soft.  Mild tenderness  appreciated in the epigastric area as well as the right upper quadrant.  No rebound rigidity or guarding.  Murphy sign equivocal.  Bowel sounds present.  No masses organomegaly.   Extremities: No edema.  Full range of motion of lower extremities. Neurologic: Pleasantly confused.  No focal neurological deficits.      Lab Results:  Data Reviewed: I have personally reviewed following labs and imaging studies  CBC: Recent Labs  Lab 03/17/18 1944 03/19/18 0517 03/20/18 0357  WBC 12.3* 8.6 6.2  NEUTROABS 11.1*  --   --   HGB 14.0 11.4* 12.4  HCT 44.4 37.5 39.2  MCV 97.6 101.1* 99.2  PLT 136* 90* 108*    Basic Metabolic Panel: Recent Labs  Lab 03/17/18 1944 03/19/18 0517 03/20/18 0357  NA 137 135 139  K 3.7 3.8 3.3*  CL 105 108 109  CO2 22 21* 21*  GLUCOSE 132* 93 94  BUN 18 20 12   CREATININE 0.73 0.73 0.63  CALCIUM 8.8* 7.5* 8.2*    GFR: Estimated Creatinine Clearance: 42.1 mL/min (by C-G formula based on SCr of 0.63 mg/dL).  Liver Function Tests: Recent Labs  Lab 03/17/18 1944 03/19/18 0517 03/20/18 0357  AST 54* 41 38  ALT 33 28 28  ALKPHOS 92 72 86  BILITOT 1.2 1.1 0.8  PROT 6.4* 5.3* 5.7*  ALBUMIN 3.7 2.9* 3.0*     Recent Results (from the past 240 hour(s))  Culture,  Urine     Status: Abnormal (Preliminary result)   Collection Time: 03/17/18  9:51 PM  Result Value Ref Range Status   Specimen Description URINE, RANDOM  Final   Special Requests   Final    NONE Performed at [facility], [address] [place], [place] [address]    Culture >=100,000 COLONIES/mL ESCHERICHIA COLI (A)  Final   Report Status PENDING  Incomplete  Respiratory Panel by PCR     Status: Abnormal   Collection Time: 03/18/18  1:14 AM  Result Value Ref Range Status   Adenovirus NOT DETECTED NOT DETECTED Final   Coronavirus 229E NOT DETECTED NOT DETECTED Final    Comment: (NOTE) The Coronavirus on the Respiratory Panel, DOES NOT test for the novel    Coronavirus (2019 nCoV)    Coronavirus HKU1 NOT DETECTED NOT DETECTED Final   Coronavirus NL63 NOT DETECTED NOT DETECTED Final   Coronavirus OC43 NOT DETECTED NOT DETECTED Final   Metapneumovirus DETECTED (A) NOT DETECTED Final   Rhinovirus / Enterovirus NOT DETECTED NOT DETECTED Final   Influenza A NOT DETECTED NOT DETECTED Final   Influenza B NOT DETECTED NOT DETECTED Final   Parainfluenza Virus 1 NOT DETECTED NOT DETECTED Final   Parainfluenza Virus 2 NOT DETECTED NOT DETECTED Final   Parainfluenza Virus 3 NOT DETECTED NOT DETECTED Final   Parainfluenza Virus 4 NOT DETECTED NOT DETECTED Final   Respiratory Syncytial Virus NOT DETECTED NOT DETECTED Final   Bordetella pertussis NOT DETECTED NOT DETECTED Final   Chlamydophila pneumoniae NOT DETECTED NOT DETECTED Final   Mycoplasma pneumoniae NOT DETECTED NOT DETECTED Final    Comment: Performed at [facility], [address] [place], [place] [address]  Culture, blood (routine x 2)     Status: Abnormal   Collection Time: 03/18/18  1:14 AM  Result Value Ref Range Status   Specimen Description   Final    BLOOD BLOOD LEFT FOREARM Performed at [facility], [address] [place], [place] [address]    Special Requests   Final    BOTTLES DRAWN AEROBIC ONLY Blood Culture adequate volume Performed at [facility], [address] [place], [place] [address]    Culture  Setup Time   Final    GRAM NEGATIVE RODS AEROBIC BOTTLE ONLY CRITICAL RESULT CALLED TO, READ BACK BY AND VERIFIED WITH: Fenton FoyA. Pham PharmD 17:20 03/18/18 ([email or web address]) Performed at [facility], [address] [place], [place] [address]    Culture ESCHERICHIA COLI (A)  Final   Report Status 03/20/2018 FINAL  Final   Organism ID, Bacteria ESCHERICHIA COLI  Final      Susceptibility   Escherichia coli - MIC*    AMPICILLIN 16 INTERMEDIATE Intermediate     CEFAZOLIN <=4 SENSITIVE Sensitive     CEFEPIME <=1 SENSITIVE Sensitive      CEFTAZIDIME <=1 SENSITIVE Sensitive     CEFTRIAXONE <=1 SENSITIVE Sensitive     CIPROFLOXACIN <=0.25 SENSITIVE Sensitive     GENTAMICIN <=1 SENSITIVE Sensitive     IMIPENEM <=0.25 SENSITIVE Sensitive     TRIMETH/SULFA <=20 SENSITIVE Sensitive     AMPICILLIN/SULBACTAM 8 SENSITIVE Sensitive     PIP/TAZO <=4 SENSITIVE Sensitive     Extended ESBL NEGATIVE Sensitive     * ESCHERICHIA COLI  Culture, blood (routine x 2)     Status: Abnormal   Collection Time: 03/18/18  1:14 AM  Result Value Ref Range Status   Specimen Description  Final    BLOOD BLOOD RIGHT HAND Performed at [facility], [address] [place], [place] [address]    Special Requests   Final    BOTTLES DRAWN AEROBIC ONLY Blood Culture adequate volume Performed at [facility], [address] [place], [place] [address]    Culture  Setup Time   Final    GRAM NEGATIVE RODS AEROBIC BOTTLE ONLY CRITICAL VALUE NOTED.  VALUE IS CONSISTENT WITH PREVIOUSLY REPORTED AND CALLED VALUE.    Culture (A)  Final    ESCHERICHIA COLI SUSCEPTIBILITIES PERFORMED ON PREVIOUS CULTURE WITHIN THE LAST 5 DAYS. Performed at [facility], [address] [place], [place] [address]    Report Status 03/20/2018 FINAL  Final  Blood Culture ID Panel (Reflexed)     Status: Abnormal   Collection Time: 03/18/18  1:14 AM  Result Value Ref Range Status   Enterococcus species NOT DETECTED NOT DETECTED Final   Listeria monocytogenes NOT DETECTED NOT DETECTED Final   Staphylococcus species NOT DETECTED NOT DETECTED Final   Staphylococcus aureus (BCID) NOT DETECTED NOT DETECTED Final   Streptococcus species NOT DETECTED NOT DETECTED Final   Streptococcus agalactiae NOT DETECTED NOT DETECTED Final   Streptococcus pneumoniae NOT DETECTED NOT DETECTED Final   Streptococcus pyogenes NOT DETECTED NOT DETECTED Final   Acinetobacter baumannii NOT DETECTED NOT DETECTED Final   Enterobacteriaceae species DETECTED  (A) NOT DETECTED Final    Comment: Enterobacteriaceae represent a large family of gram-negative bacteria, not a single organism. CRITICAL RESULT CALLED TO, READ BACK BY AND VERIFIED WITH: Fenton FoyA. Pham PharmD 17:20 03/18/18 ([email or web address])    Enterobacter cloacae complex NOT DETECTED NOT DETECTED Final   Escherichia coli DETECTED (A) NOT DETECTED Final    Comment: CRITICAL RESULT CALLED TO, READ BACK BY AND VERIFIED WITH: Fenton FoyA. Pham PharmD 17:20 03/18/18 (wilsonm)    Klebsiella oxytoca NOT DETECTED NOT DETECTED Final   Klebsiella pneumoniae NOT DETECTED NOT DETECTED Final   Proteus species NOT DETECTED NOT DETECTED Final   Serratia marcescens NOT DETECTED NOT DETECTED Final   Carbapenem resistance NOT DETECTED NOT DETECTED Final   Haemophilus influenzae NOT DETECTED NOT DETECTED Final   Neisseria meningitidis NOT DETECTED NOT DETECTED Final   Pseudomonas aeruginosa NOT DETECTED NOT DETECTED Final   Candida albicans NOT DETECTED NOT DETECTED Final   Candida glabrata NOT DETECTED NOT DETECTED Final   Candida krusei NOT DETECTED NOT DETECTED Final   Candida parapsilosis NOT DETECTED NOT DETECTED Final   Candida tropicalis NOT DETECTED NOT DETECTED Final    Comment: Performed at [facility], [address] [place], [place] [address]      Radiology Studies: Ct Abdomen Pelvis W Contrast  Result Date: 03/19/2018 CLINICAL DATA:  Mid to low abdominal pain. EXAM: CT ABDOMEN AND PELVIS WITH CONTRAST TECHNIQUE: Multidetector CT imaging of the abdomen and pelvis was performed using the standard protocol following bolus administration of intravenous contrast. CONTRAST:  100mL OMNIPAQUE IOHEXOL 300 MG/ML SOLN, 15mL OMNIPAQUE IOHEXOL 300 MG/ML SOLN COMPARISON:  05/03/2010 FINDINGS: Lower chest: Heart is mildly enlarged.  Small pericardial effusion. Small bilateral pleural effusions. There is dependent opacity in the lower lobes, which may all be atelectasis. A component pneumonia is possible particularly in the  left lower lobe. There is also bilateral lower lobe bronchial wall thickening. Hepatobiliary: Liver is normal in size. There are small foci of increased enhancement that are likely transient hepatic attenuation differences, supported by resolution on the delayed images. Subcentimeter  low-density lesion in the anterior left lobe consistent with a cyst and stable from prior CT. No other liver abnormalities. Gallbladder is mildly distended. There is increased enhancement of the mucosa with surrounding edema and/or wall thickening. Hazy edema extends along the gastrohepatic ligament. No visualized gallstones. Common bile duct measures 7 mm, normal for age. Pancreas: No mass or inflammation. Dilated pancreatic duct, maximum 4 mm, increased when compared to the prior CT. Spleen: Normal in size without focal abnormality. Adrenals/Urinary Tract: No adrenal masses. Mild bilateral renal cortical thinning. Low-density, homogeneous left mid to lower pole renal mass consistent with a cyst, measuring 2.4 cm. No renal stones. No hydronephrosis. Ureters are normal in course and in caliber. No ureteral stones. Normal bladder. Stomach/Bowel: Stomach is mostly decompressed. There is increased enhancement of the mucosa of the duodenum. Hazy inflammatory type changes extend along the first and second portions of the duodenum contiguous with that surrounding the gallbladder. No duodenal wall thickening. Small bowel is normal in caliber. No wall thickening or inflammation. There is mild dilation of the right colon. No colonic wall thickening or inflammation. Vascular/Lymphatic: Aortic atherosclerosis. No enlarged abdominal or pelvic lymph nodes. Reproductive: Uterus and bilateral adnexa are unremarkable. Other: Trace amount of ascites is seen adjacent to the liver and in the posterior pelvic recess. Musculoskeletal: Mild compression deformity of L2, stable from the prior CT. No acute fracture. No osteoblastic or osteolytic lesions.  IMPRESSION: 1. There are inflammatory changes centered around the gallbladder tracking along the gastrohepatic ligament and adjacent to the first and second portions of the duodenum. Findings may be due to acute cholecystitis or duodenitis. Findings could potentially be due to liver inflammation. There is no evidence of pancreatitis. 2. No other acute abnormality within the abdomen or pelvis. 3. Small pleural effusions, small pericardial effusion and lower lobe, lung base opacities. Lower lobe opacities are most likely atelectasis. Pneumonia is possible. There is bilateral lower lobe bronchial wall thickening, which could reflect acute or chronic inflammation. 4. Chronic findings including aortic atherosclerosis and a left renal cyst. Electronically Signed   By: Amie Portland M.D.   On: 03/19/2018 14:51   US Abdomen Limited Ruq  Result Date: 03/19/2018 CLINICAL DATA:  Inflammatory changes involving the gallbladder on an abdomen and pelvis CT earlier today. EXAM: ULTRASOUND ABDOMEN LIMITED RIGHT UPPER QUADRANT COMPARISON:  Abdomen and pelvis CT obtained earlier today. FINDINGS: Gallbladder: Diffuse gallbladder wall thickening with a maximum thickness of 4.8 mm. Small amount of sludge in the gallbladder. No gallstones or pericholecystic fluid. There was a positive sonographic Murphy sign. Common bile duct: Diameter: 6.2 mm Liver: No focal lesion identified. The small stable probable cyst in the liver was not visualized sonographically. Within normal limits in parenchymal echogenicity. Portal vein is patent on color Doppler imaging with normal direction of blood flow towards the liver. IMPRESSION: 1. Diffuse gallbladder wall thickening, small amount of sludge in the gallbladder and positive sonographic Murphy sign. This could be due to acute or chronic acalculous cholecystitis. Secondary inflammation due to hepatitis or duodenitis is less likely but not excluded based on the CT appearance. 2. Otherwise, unremarkable  examination. Electronically Signed   By: Beckie Salts M.D.   On: 03/19/2018 21:45     Medications:  Scheduled: . guaiFENesin  600 mg Oral BID  . ipratropium-albuterol  3 mL Nebulization Q6H  . pantoprazole  40 mg Oral Q1200  . polyethylene glycol  17 g Oral Daily  . sodium chloride flush  3 mL Intravenous Q12H  .  traZODone  50 mg Oral QHS  . venlafaxine XR  150 mg Oral Daily   Continuous: . 0.45 % NaCl with KCl 20 mEq / L 75 mL/hr at 03/20/18 0748  . cefTRIAXone (ROCEPHIN)  IV 2 g (03/19/18 1801)   FEX:[id number], guaiFENesin-dextromethorphan, iohexol    Assessment/Plan:  Sepsis likely secondary to E. coli bacteremia/?  Biliary source/?UTI Blood cultures positive for E. coli.  Respiratory viral panel positive for metapneumovirus.  Patient did have some GI symptoms at presentation.  Continues to have nausea and vague abdominal discomfort.  UA did not show any clear evidence for UTI.  Urine culture appears to be growing E. coli as well.  Patient underwent a CT scan of her abdomen and pelvis yesterday which raised concern for inflammation in the gallbladder.  She underwent ultrasound as well which also raised similar concerns.  Discussed with general surgery who will consult.  Keep her n.p.o. for now.  She has been afebrile.  Procalcitonin level peaked at 13.37 and improved to 7.42 today.  Continue ceftriaxone.  WBC remains normal.  Lactic acid level was normal.  Influenza panel was negative.   Abnormal appearing gallbladder This was noted on CT scan and ultrasound.  Concern raised for duodenitis as well.  Patient also placed on PPI.  See above as well.  General surgery consulted.  Hypotension Most likely due to combination of medication and infection.  Has improved.  Continue to hold propranolol.    Sinus bradycardia Most likely due to beta-blocker.  Patient also was on Aricept which could have also contributed.  TSH and free T4 normal.   Thrombocytopenia Patient had a drop in  platelet counts yesterday.  Stable this morning.  Heparin products on hold.    Acute metabolic encephalopathy in the setting of dementia Patient was noted to be more confused than usual at the time of admission.  Most likely due to the fever.  She appears to be back to baseline now.  Does not appear to have any neurological deficits.  History of anxiety and depression Continue home medications.  History of migraine headaches Patient is on propranolol for prophylaxis at home.  Held due to hypotension and bradycardia.  History of insomnia Trazodone.  History of dementia Aricept on hold due to bradycardia.  Hypokalemia This will be repleted intravenously.  Left renal cyst Incidentally noted on CT scan.  DVT Prophylaxis: Lovenox discontinued due to thrombocytopenia.  SCDs. Code Status: DNR Family Communication: Discussed with patient and her daughter who was at the bedside. Disposition Plan: Await general surgery input.  Somewhat puzzling scenario as discussed above.  Continue to monitor.      LOS: 1 day   Payam Gribble [employer] on [email or web address]  03/20/2018, 11:53 AM

## 2018-03-20 NOTE — Plan of Care (Signed)
  Problem: Education: Goal: Knowledge of General Education information will improve Description Including pain rating scale, medication(s)/side effects and non-pharmacologic comfort measures Outcome: Progressing   Problem: Spiritual Needs Goal: Ability to function at adequate level Outcome: Progressing   Problem: Clinical Measurements: Goal: Will remain free from infection Outcome: Progressing   Problem: Clinical Measurements: Goal: Diagnostic test results will improve Outcome: Progressing

## 2018-03-21 LAB — COMPREHENSIVE METABOLIC PANEL
ALT: 26 U/L (ref 0–44)
AST: 38 U/L (ref 15–41)
Albumin: 2.8 g/dL — ABNORMAL LOW (ref 3.5–5.0)
Alkaline Phosphatase: 75 U/L (ref 38–126)
Anion gap: 6 (ref 5–15)
BUN: 6 mg/dL — ABNORMAL LOW (ref 8–23)
CO2: 21 mmol/L — ABNORMAL LOW (ref 22–32)
Calcium: 8.1 mg/dL — ABNORMAL LOW (ref 8.9–10.3)
Chloride: 113 mmol/L — ABNORMAL HIGH (ref 98–111)
Creatinine, Ser: 0.57 mg/dL (ref 0.44–1.00)
GFR calc Af Amer: >60 mL/min (ref >60)
GFR calc non Af Amer: >60 mL/min (ref >60)
Glucose, Bld: 88 mg/dL (ref 70–99)
Potassium: 3.8 mmol/L (ref 3.5–5.1)
SODIUM: 140 mmol/L (ref 135–145)
Total Bilirubin: 0.7 mg/dL (ref 0.3–1.2)
Total Protein: 5.2 g/dL — ABNORMAL LOW (ref 6.5–8.1)

## 2018-03-21 LAB — CBC
HCT: 37.4 % (ref 36.0–46.0)
Hemoglobin: 11.4 g/dL — ABNORMAL LOW (ref 12.0–15.0)
MCH: 30.9 pg (ref 26.0–34.0)
MCHC: 30.5 g/dL (ref 30.0–36.0)
MCV: 101.4 fL — ABNORMAL HIGH (ref 80.0–100.0)
NRBC: 0 % (ref 0.0–0.2)
Platelets: 109 10*3/uL — ABNORMAL LOW (ref 150–400)
RBC: 3.69 MIL/uL — ABNORMAL LOW (ref 3.87–5.11)
RDW: 15 % (ref 11.5–15.5)
WBC: 5 10*3/uL (ref 4.0–10.5)

## 2018-03-21 LAB — URINE CULTURE: Culture: >=100000 — AB

## 2018-03-21 NOTE — Plan of Care (Signed)
Pt stable overall though remains weak, tired, and confused. Family at bedside. No changes to note overall. Pt tolerating liquids well, though po intake remains poor at times.

## 2018-03-21 NOTE — Plan of Care (Signed)
Pt stable with no needs at this time. Family at bedside. Md to see pt. No s/s of distress or pain. Will continue to monitor.

## 2018-03-21 NOTE — Progress Notes (Signed)
Md notified of Hida scan to be delayed until Monday. Only stat studies to be done on Sundays. RN will also notify family as well.

## 2018-03-21 NOTE — Progress Notes (Addendum)
[facility] PROGRESS NOTE  LATEESHA BEZOLD UJW:[id number] DOB: October 07, 1932 DOA: 03/17/2018  PCP: Merri Brunette, MD  Brief History/Interval Summary: 85 year old woman with medical problems including dementia who is independent in her ADLs at baseline, anxiety/depression, migraine headaches, osteoporosis, insomnia who presented with a constellation of symptoms including upper airway congestion, cough, fever, and confusion.    She was found to be febrile with a temperature of 103.5 F.  WBC was noted to be elevated.  No clear source of infection was identified.  Patient was hospitalized for further management.  Reason for Visit: E. coli bacteremia.   Consultants: General surgery  Procedures: None  Antibiotics: Given a dose of azithromycin on 2/6 Changed over to ceftriaxone due to bacteremia  Subjective/Interval History: Patient is a poor historian due to her dementia.  Patient's daughter is at the bedside.  Patient states that she is hungry.  Denies any nausea vomiting.  No abdominal pain.  No urinary discomfort.    ROS: Denies any headaches.  Objective:  Vital Signs  Vitals:   03/20/18 1354 03/20/18 1924 03/20/18 2158 03/21/18 0451  BP: (!) 144/69  117/73 (!) 153/73  Pulse: (!) 53  (!) 52 (!) 43  Resp: 15  15   Temp: 98.3 F (36.8 C)  98.2 F (36.8 C) 97.9 F (36.6 C)  TempSrc: Oral  Oral Oral  SpO2: 96% 97% 98% 96%  Weight:      Height:        Intake/Output Summary (Last 24 hours) at 03/21/2018 0915 Last data filed at 03/21/2018 0200 Gross per 24 hour  Intake 1321.34 ml  Output 100 ml  Net 1221.34 ml   Filed Weights   03/18/18 0432 03/19/18 0500 03/20/18 0640  Weight: 51.3 kg 52.5 kg 54.2 kg   General appearance: Awake alert.  In no distress.  Mildly distracted. Resp: Clear to auscultation bilaterally.  Normal effort Cardio: S1-S2 is normal regular.  No S3-S4.  No rubs murmurs or bruit GI: Abdomen is soft.  No tenderness appreciated today.  Bowel sounds  present normal.  No masses organomegaly.   Extremities: No edema.  Full range of motion of lower extremities. Neurologic: Alert and oriented x3.  No focal neurological deficits.    Lab Results:  Data Reviewed: I have personally reviewed following labs and imaging studies  CBC: Recent Labs  Lab 03/17/18 1944 03/19/18 0517 03/20/18 0357 03/21/18 0443  WBC 12.3* 8.6 6.2 5.0  NEUTROABS 11.1*  --   --   --   HGB 14.0 11.4* 12.4 11.4*  HCT 44.4 37.5 39.2 37.4  MCV 97.6 101.1* 99.2 101.4*  PLT 136* 90* 108* 109*    Basic Metabolic Panel: Recent Labs  Lab 03/17/18 1944 03/19/18 0517 03/20/18 0357 03/21/18 0443  NA 137 135 139 140  K 3.7 3.8 3.3* 3.8  CL 105 108 109 113*  CO2 22 21* 21* 21*  GLUCOSE 132* 93 94 88  BUN 18 20 12  6*  CREATININE 0.73 0.73 0.63 0.57  CALCIUM 8.8* 7.5* 8.2* 8.1*    GFR: Estimated Creatinine Clearance: 42.1 mL/min (by C-G formula based on SCr of 0.57 mg/dL).  Liver Function Tests: Recent Labs  Lab 03/17/18 1944 03/19/18 0517 03/20/18 0357 03/21/18 0443  AST 54* 41 38 38  ALT 33 28 28 26   ALKPHOS 92 72 86 75  BILITOT 1.2 1.1 0.8 0.7  PROT 6.4* 5.3* 5.7* 5.2*  ALBUMIN 3.7 2.9* 3.0* 2.8*     Recent Results (from the past  240 hour(s))  Culture, Urine     Status: Abnormal   Collection Time: 03/17/18  9:51 PM  Result Value Ref Range Status   Specimen Description URINE, RANDOM  Final   Special Requests   Final    NONE Performed at [facility], [address] [place], [place] [address]    Culture >=100,000 COLONIES/mL ESCHERICHIA COLI (A)  Final   Report Status 03/21/2018 FINAL  Final   Organism ID, Bacteria ESCHERICHIA COLI (A)  Final      Susceptibility   Escherichia coli - MIC*    AMPICILLIN <=2 SENSITIVE Sensitive     CEFAZOLIN <=4 SENSITIVE Sensitive     CEFTRIAXONE <=1 SENSITIVE Sensitive     CIPROFLOXACIN <=0.25 SENSITIVE Sensitive     GENTAMICIN <=1 SENSITIVE Sensitive     IMIPENEM <=0.25 SENSITIVE  Sensitive     NITROFURANTOIN <=16 SENSITIVE Sensitive     TRIMETH/SULFA <=20 SENSITIVE Sensitive     AMPICILLIN/SULBACTAM <=2 SENSITIVE Sensitive     PIP/TAZO <=4 SENSITIVE Sensitive     Extended ESBL NEGATIVE Sensitive     * >=100,000 COLONIES/mL ESCHERICHIA COLI  Respiratory Panel by PCR     Status: Abnormal   Collection Time: 03/18/18  1:14 AM  Result Value Ref Range Status   Adenovirus NOT DETECTED NOT DETECTED Final   Coronavirus 229E NOT DETECTED NOT DETECTED Final    Comment: (NOTE) The Coronavirus on the Respiratory Panel, DOES NOT test for the novel  Coronavirus (2019 nCoV)    Coronavirus HKU1 NOT DETECTED NOT DETECTED Final   Coronavirus NL63 NOT DETECTED NOT DETECTED Final   Coronavirus OC43 NOT DETECTED NOT DETECTED Final   Metapneumovirus DETECTED (A) NOT DETECTED Final   Rhinovirus / Enterovirus NOT DETECTED NOT DETECTED Final   Influenza A NOT DETECTED NOT DETECTED Final   Influenza B NOT DETECTED NOT DETECTED Final   Parainfluenza Virus 1 NOT DETECTED NOT DETECTED Final   Parainfluenza Virus 2 NOT DETECTED NOT DETECTED Final   Parainfluenza Virus 3 NOT DETECTED NOT DETECTED Final   Parainfluenza Virus 4 NOT DETECTED NOT DETECTED Final   Respiratory Syncytial Virus NOT DETECTED NOT DETECTED Final   Bordetella pertussis NOT DETECTED NOT DETECTED Final   Chlamydophila pneumoniae NOT DETECTED NOT DETECTED Final   Mycoplasma pneumoniae NOT DETECTED NOT DETECTED Final    Comment: Performed at [facility], [address] [place], [place] [address]  Culture, blood (routine x 2)     Status: Abnormal   Collection Time: 03/18/18  1:14 AM  Result Value Ref Range Status   Specimen Description   Final    BLOOD BLOOD LEFT FOREARM Performed at [facility], [address] [place], [place] [address]    Special Requests   Final    BOTTLES DRAWN AEROBIC ONLY Blood Culture adequate volume Performed at [facility], [address] [place], [place] [address]    Culture  Setup Time   Final    GRAM NEGATIVE RODS AEROBIC BOTTLE ONLY CRITICAL RESULT CALLED TO, READ BACK BY AND VERIFIED WITH: Fenton FoyA. Pham PharmD 17:20 03/18/18 ([email or web address]) Performed at [facility], [address] [place], [place] [address]    Culture ESCHERICHIA COLI (A)  Final   Report Status 03/20/2018 FINAL  Final   Organism ID, Bacteria ESCHERICHIA COLI  Final      Susceptibility   Escherichia coli - MIC*    AMPICILLIN 16 INTERMEDIATE Intermediate     CEFAZOLIN <=  4 SENSITIVE Sensitive     CEFEPIME <=1 SENSITIVE Sensitive     CEFTAZIDIME <=1 SENSITIVE Sensitive     CEFTRIAXONE <=1 SENSITIVE Sensitive     CIPROFLOXACIN <=0.25 SENSITIVE Sensitive     GENTAMICIN <=1 SENSITIVE Sensitive     IMIPENEM <=0.25 SENSITIVE Sensitive     TRIMETH/SULFA <=20 SENSITIVE Sensitive     AMPICILLIN/SULBACTAM 8 SENSITIVE Sensitive     PIP/TAZO <=4 SENSITIVE Sensitive     Extended ESBL NEGATIVE Sensitive     * ESCHERICHIA COLI  Culture, blood (routine x 2)     Status: Abnormal   Collection Time: 03/18/18  1:14 AM  Result Value Ref Range Status   Specimen Description   Final    BLOOD BLOOD RIGHT HAND Performed at [facility], [address] [place], [place] [address]    Special Requests   Final    BOTTLES DRAWN AEROBIC ONLY Blood Culture adequate volume Performed at [facility], [address] [place], [place] [address]    Culture  Setup Time   Final    GRAM NEGATIVE RODS AEROBIC BOTTLE ONLY CRITICAL VALUE NOTED.  VALUE IS CONSISTENT WITH PREVIOUSLY REPORTED AND CALLED VALUE.    Culture (A)  Final    ESCHERICHIA COLI SUSCEPTIBILITIES PERFORMED ON PREVIOUS CULTURE WITHIN THE LAST 5 DAYS. Performed at [facility], [address] [place], [place] [address]    Report Status 03/20/2018 FINAL  Final  Blood Culture ID Panel (Reflexed)     Status: Abnormal   Collection Time: 03/18/18  1:14 AM  Result Value Ref  Range Status   Enterococcus species NOT DETECTED NOT DETECTED Final   Listeria monocytogenes NOT DETECTED NOT DETECTED Final   Staphylococcus species NOT DETECTED NOT DETECTED Final   Staphylococcus aureus (BCID) NOT DETECTED NOT DETECTED Final   Streptococcus species NOT DETECTED NOT DETECTED Final   Streptococcus agalactiae NOT DETECTED NOT DETECTED Final   Streptococcus pneumoniae NOT DETECTED NOT DETECTED Final   Streptococcus pyogenes NOT DETECTED NOT DETECTED Final   Acinetobacter baumannii NOT DETECTED NOT DETECTED Final   Enterobacteriaceae species DETECTED (A) NOT DETECTED Final    Comment: Enterobacteriaceae represent a large family of gram-negative bacteria, not a single organism. CRITICAL RESULT CALLED TO, READ BACK BY AND VERIFIED WITH: Fenton FoyA. Pham PharmD 17:20 03/18/18 ([email or web address])    Enterobacter cloacae complex NOT DETECTED NOT DETECTED Final   Escherichia coli DETECTED (A) NOT DETECTED Final    Comment: CRITICAL RESULT CALLED TO, READ BACK BY AND VERIFIED WITH: Fenton FoyA. Pham PharmD 17:20 03/18/18 (wilsonm)    Klebsiella oxytoca NOT DETECTED NOT DETECTED Final   Klebsiella pneumoniae NOT DETECTED NOT DETECTED Final   Proteus species NOT DETECTED NOT DETECTED Final   Serratia marcescens NOT DETECTED NOT DETECTED Final   Carbapenem resistance NOT DETECTED NOT DETECTED Final   Haemophilus influenzae NOT DETECTED NOT DETECTED Final   Neisseria meningitidis NOT DETECTED NOT DETECTED Final   Pseudomonas aeruginosa NOT DETECTED NOT DETECTED Final   Candida albicans NOT DETECTED NOT DETECTED Final   Candida glabrata NOT DETECTED NOT DETECTED Final   Candida krusei NOT DETECTED NOT DETECTED Final   Candida parapsilosis NOT DETECTED NOT DETECTED Final   Candida tropicalis NOT DETECTED NOT DETECTED Final    Comment: Performed at [facility], [address] [place], [place] [address]      Radiology Studies: Ct Abdomen Pelvis W Contrast  Result Date: 03/19/2018 CLINICAL DATA:  Mid to  low abdominal  pain. EXAM: CT ABDOMEN AND PELVIS WITH CONTRAST TECHNIQUE: Multidetector CT imaging of the abdomen and pelvis was performed using the standard protocol following bolus administration of intravenous contrast. CONTRAST:  OMNIPAQUE IOHEXOL 300 MG/ML SOLN, 33mL OMNIPAQUE IOHEXOL 300 MG/ML SOLN COMPARISON:  05/03/2010 FINDINGS: Lower chest: Heart is mildly enlarged.  Small pericardial effusion. Small bilateral pleural effusions. There is dependent opacity in the lower lobes, which may all be atelectasis. A component pneumonia is possible particularly in the left lower lobe. There is also bilateral lower lobe bronchial wall thickening. Hepatobiliary: Liver is normal in size. There are small foci of increased enhancement that are likely transient hepatic attenuation differences, supported by resolution on the delayed images. Subcentimeter low-density lesion in the anterior left lobe consistent with a cyst and stable from prior CT. No other liver abnormalities. Gallbladder is mildly distended. There is increased enhancement of the mucosa with surrounding edema and/or wall thickening. Hazy edema extends along the gastrohepatic ligament. No visualized gallstones. Common bile duct measures 7 mm, normal for age. Pancreas: No mass or inflammation. Dilated pancreatic duct, maximum 4 mm, increased when compared to the prior CT. Spleen: Normal in size without focal abnormality. Adrenals/Urinary Tract: No adrenal masses. Mild bilateral renal cortical thinning. Low-density, homogeneous left mid to lower pole renal mass consistent with a cyst, measuring 2.4 cm. No renal stones. No hydronephrosis. Ureters are normal in course and in caliber. No ureteral stones. Normal bladder. Stomach/Bowel: Stomach is mostly decompressed. There is increased enhancement of the mucosa of the duodenum. Hazy inflammatory type changes extend along the first and second portions of the duodenum contiguous with that surrounding the  gallbladder. No duodenal wall thickening. Small bowel is normal in caliber. No wall thickening or inflammation. There is mild dilation of the right colon. No colonic wall thickening or inflammation. Vascular/Lymphatic: Aortic atherosclerosis. No enlarged abdominal or pelvic lymph nodes. Reproductive: Uterus and bilateral adnexa are unremarkable. Other: Trace amount of ascites is seen adjacent to the liver and in the posterior pelvic recess. Musculoskeletal: Mild compression deformity of L2, stable from the prior CT. No acute fracture. No osteoblastic or osteolytic lesions. IMPRESSION: 1. There are inflammatory changes centered around the gallbladder tracking along the gastrohepatic ligament and adjacent to the first and second portions of the duodenum. Findings may be due to acute cholecystitis or duodenitis. Findings could potentially be due to liver inflammation. There is no evidence of pancreatitis. 2. No other acute abnormality within the abdomen or pelvis. 3. Small pleural effusions, small pericardial effusion and lower lobe, lung base opacities. Lower lobe opacities are most likely atelectasis. Pneumonia is possible. There is bilateral lower lobe bronchial wall thickening, which could reflect acute or chronic inflammation. 4. Chronic findings including aortic atherosclerosis and a left renal cyst. Electronically Signed   By: Amie Portland M.D.   On: 03/19/2018 14:51   US Abdomen Limited Ruq  Result Date: 03/19/2018 CLINICAL DATA:  Inflammatory changes involving the gallbladder on an abdomen and pelvis CT earlier today. EXAM: ULTRASOUND ABDOMEN LIMITED RIGHT UPPER QUADRANT COMPARISON:  Abdomen and pelvis CT obtained earlier today. FINDINGS: Gallbladder: Diffuse gallbladder wall thickening with a maximum thickness of 4.8 mm. Small amount of sludge in the gallbladder. No gallstones or pericholecystic fluid. There was a positive sonographic Murphy sign. Common bile duct: Diameter: 6.2 mm Liver: No focal lesion  identified. The small stable probable cyst in the liver was not visualized sonographically. Within normal limits in parenchymal echogenicity. Portal vein is patent on color Doppler imaging with  normal direction of blood flow towards the liver. IMPRESSION: 1. Diffuse gallbladder wall thickening, small amount of sludge in the gallbladder and positive sonographic Murphy sign. This could be due to acute or chronic acalculous cholecystitis. Secondary inflammation due to hepatitis or duodenitis is less likely but not excluded based on the CT appearance. 2. Otherwise, unremarkable examination. Electronically Signed   By: Beckie Salts M.D.   On: 03/19/2018 21:45     Medications:  Scheduled: . guaiFENesin  600 mg Oral BID  . ipratropium-albuterol  3 mL Nebulization BID  . pantoprazole  40 mg Oral Q1200  . polyethylene glycol  17 g Oral Daily  . sodium chloride flush  3 mL Intravenous Q12H  . traZODone  50 mg Oral QHS  . venlafaxine XR  150 mg Oral Daily   Continuous: . 0.45 % NaCl with KCl 20 mEq / L 75 mL/hr at 03/21/18 0200  . cefTRIAXone (ROCEPHIN)  IV Stopped (03/20/18 1915)   ZOX:[id number], albuterol, guaiFENesin-dextromethorphan, iohexol    Assessment/Plan:  Sepsis likely secondary to E. coli bacteremia/?  Biliary source/?UTI Blood cultures positive for E. coli.  Respiratory viral panel positive for metapneumovirus.  Patient did have some GI symptoms at presentation.  Continues to have nausea and vague abdominal discomfort.  UA did not show any clear evidence for UTI.  Her urine culture is positive for E. coli.  This is somewhat of a puzzling picture.  Her CT scan raise concern for cholecystitis which was also confirmed on the ultrasound.  General surgery was consulted.  HIDA scan has been ordered.  Patient seems to be doing well.  Discussed with Dr. Ezzard Standing with general surgery this morning.  Okay to advance her diet while she is waiting on the HIDA scan.  Patient's procalcitonin level was  elevated and has improved.  Continue ceftriaxone for now.  WBC remains normal.  She is afebrile.  Lactic acid level was normal.  Influenza PCR was negative.   Abnormal appearing gallbladder This was noted on CT scan and ultrasound.  Concern raised for duodenitis as well.  Patient also placed on PPI.  See above as well.  Discussed with general surgery this morning.  Waiting on HIDA scan.  Acute bronchitis Secondary to metapneumovirus.  Respiratory status is stable.  Still has a cough.  Saturating normal on room air.  No wheezing.  Hypotension Most likely due to combination of medication and infection.  Blood pressure has improved.  Heart rate remains low.  Continue to hold propranolol.  If she becomes hypotensive may need to use alternative agents.  Sinus bradycardia Most likely due to beta-blocker.  Patient also was on Aricept which could have also contributed.  TSH and free T4 normal.   Thrombocytopenia Drop in platelet counts most likely due to sepsis.  Improved.  Avoid heparin products.  Acute metabolic encephalopathy in the setting of dementia Patient was noted to be more confused than usual at the time of admission.  Most likely due to the fever.  This is back to baseline.  History of anxiety and depression Continue home medications.  History of migraine headaches Patient is on propranolol for prophylaxis at home.  Held due to hypotension and bradycardia.  History of insomnia Trazodone.  History of dementia Aricept on hold due to bradycardia.  Hypokalemia Normal this morning.  Left renal cyst Incidentally noted on CT scan.  DVT Prophylaxis: Lovenox discontinued due to thrombocytopenia.  SCDs. Code Status: DNR Family Communication: Discussed with patient and her daughter who was  at the bedside. Disposition Plan: Await HIDA scan.  Mobilize.    LOS: 2 days   Freddi Schrager Rito Ehrlich  [facility] Pager on [email or web address]  03/21/2018, 9:15 AM

## 2018-03-21 NOTE — Progress Notes (Signed)
[facility]:  [phone] General Surgery Progress Note   LOS: 2 days  POD -     Chief Complaint: sepsis  Assessment and Plan: 1.  Possible underlying gall bladder disease as source of E. Coli sepsis (but with recent results, may be urine)             CT scan and [place] both are suggestive of gall bladder disease - though there are no obvious stones.  He labs look good.  And, at least on my exam, her abdomen is benign.             Await HIDA scan - which is planned for Monday.  To advance to reg diet while waiting.  2.  Pulmonary congestion             Respiratory viral panel positive for metapneumovirus             She still has a significant cough.  3.  E. Coli bacteremia             On Ceftriaxone  Urine has come back with >100,000 colonies E. Coli !!!  This may be the more like source of sepsis. 4.  Thrombocytopenia             Platelets - 109,000 - 03/21/2018 5.  Dementia - mild 6.  Aortic atherosclerosis 7.  Migraines             Sees Dr. Corrinne Eagle for dementia and migraines    Active Problems:   Febrile illness   Sepsis (HCC)   Subjective:  Still has cough.  Having no abdominal pain.  Tolerated full lilquds.  Other daugther and son in room with patient.  Objective:   Vitals:   03/20/18 2158 03/21/18 0451  BP: 117/73 (!) 153/73  Pulse: (!) 52 (!) 43  Resp: 15   Temp: 98.2 F (36.8 C) 97.9 F (36.6 C)  SpO2: 98% 96%     Intake/Output from previous day:  02/08 0701 - 02/09 0700 In: 1321.3 [P.O.:60; I.V.:1161.3; IV Piggyback:100] Out: 100 [Urine:100]  Intake/Output this shift:  Total I/O In: 834.8 [P.O.:360; I.V.:474.8] Out: -    Physical Exam:   General: Older WF who is alert and oriented.    HEENT: Normal. Pupils equal. .   Lungs: Has raspy cough   Abdomen: Soft.  No tenderness or mass.   Lab Results:    Recent Labs    03/20/18 0357 03/21/18 0443  WBC 6.2 5.0  HGB 12.4 11.4*  HCT 39.2 37.4  PLT 108* 109*     BMET   Recent Labs    03/20/18 0357 03/21/18 0443  NA 139 140  K 3.3* 3.8  CL 109 113*  CO2 21* 21*  GLUCOSE 94 88  BUN 12 6*  CREATININE 0.63 0.57  CALCIUM 8.2* 8.1*    PT/INR  No results for input(s): LABPROT, INR in the last 72 hours.  ABG  No results for input(s): PHART, HCO3 in the last 72 hours.  Invalid input(s): PCO2, PO2   Studies/Results:  Ct Abdomen Pelvis W Contrast  Result Date: 03/19/2018 CLINICAL DATA:  Mid to low abdominal pain. EXAM: CT ABDOMEN AND PELVIS WITH CONTRAST TECHNIQUE: Multidetector CT imaging of the abdomen and pelvis was performed using the standard protocol following bolus administration of intravenous contrast. CONTRAST:  OMNIPAQUE IOHEXOL 300 MG/ML SOLN, 44mL OMNIPAQUE IOHEXOL 300 MG/ML SOLN COMPARISON:  05/03/2010 FINDINGS: Lower chest: Heart is mildly enlarged.  Small  pericardial effusion. Small bilateral pleural effusions. There is dependent opacity in the lower lobes, which may all be atelectasis. A component pneumonia is possible particularly in the left lower lobe. There is also bilateral lower lobe bronchial wall thickening. Hepatobiliary: Liver is normal in size. There are small foci of increased enhancement that are likely transient hepatic attenuation differences, supported by resolution on the delayed images. Subcentimeter low-density lesion in the anterior left lobe consistent with a cyst and stable from prior CT. No other liver abnormalities. Gallbladder is mildly distended. There is increased enhancement of the mucosa with surrounding edema and/or wall thickening. Hazy edema extends along the gastrohepatic ligament. No visualized gallstones. Common bile duct measures 7 mm, normal for age. Pancreas: No mass or inflammation. Dilated pancreatic duct, maximum 4 mm, increased when compared to the prior CT. Spleen: Normal in size without focal abnormality. Adrenals/Urinary Tract: No adrenal masses. Mild bilateral renal cortical thinning.  Low-density, homogeneous left mid to lower pole renal mass consistent with a cyst, measuring 2.4 cm. No renal stones. No hydronephrosis. Ureters are normal in course and in caliber. No ureteral stones. Normal bladder. Stomach/Bowel: Stomach is mostly decompressed. There is increased enhancement of the mucosa of the duodenum. Hazy inflammatory type changes extend along the first and second portions of the duodenum contiguous with that surrounding the gallbladder. No duodenal wall thickening. Small bowel is normal in caliber. No wall thickening or inflammation. There is mild dilation of the right colon. No colonic wall thickening or inflammation. Vascular/Lymphatic: Aortic atherosclerosis. No enlarged abdominal or pelvic lymph nodes. Reproductive: Uterus and bilateral adnexa are unremarkable. Other: Trace amount of ascites is seen adjacent to the liver and in the posterior pelvic recess. Musculoskeletal: Mild compression deformity of L2, stable from the prior CT. No acute fracture. No osteoblastic or osteolytic lesions. IMPRESSION: 1. There are inflammatory changes centered around the gallbladder tracking along the gastrohepatic ligament and adjacent to the first and second portions of the duodenum. Findings may be due to acute cholecystitis or duodenitis. Findings could potentially be due to liver inflammation. There is no evidence of pancreatitis. 2. No other acute abnormality within the abdomen or pelvis. 3. Small pleural effusions, small pericardial effusion and lower lobe, lung base opacities. Lower lobe opacities are most likely atelectasis. Pneumonia is possible. There is bilateral lower lobe bronchial wall thickening, which could reflect acute or chronic inflammation. 4. Chronic findings including aortic atherosclerosis and a left renal cyst. Electronically Signed   By: Amie Portlandavid  Ormond M.D.   On: 03/19/2018 14:51   [place] Abdomen Limited Ruq  Result Date: 03/19/2018 CLINICAL DATA:  Inflammatory changes involving  the gallbladder on an abdomen and pelvis CT earlier today. EXAM: ULTRASOUND ABDOMEN LIMITED RIGHT UPPER QUADRANT COMPARISON:  Abdomen and pelvis CT obtained earlier today. FINDINGS: Gallbladder: Diffuse gallbladder wall thickening with a maximum thickness of 4.8 mm. Small amount of sludge in the gallbladder. No gallstones or pericholecystic fluid. There was a positive sonographic Murphy sign. Common bile duct: Diameter: 6.2 mm Liver: No focal lesion identified. The small stable probable cyst in the liver was not visualized sonographically. Within normal limits in parenchymal echogenicity. Portal vein is patent on color Doppler imaging with normal direction of blood flow towards the liver. IMPRESSION: 1. Diffuse gallbladder wall thickening, small amount of sludge in the gallbladder and positive sonographic Murphy sign. This could be due to acute or chronic acalculous cholecystitis. Secondary inflammation due to hepatitis or duodenitis is less likely but not excluded based on the CT appearance. 2.  Otherwise, unremarkable examination. Electronically Signed   By: Beckie Salts M.D.   On: 03/19/2018 21:45     Anti-infectives:   Anti-infectives (From admission, onward)   Start     Dose/Rate Route Frequency Ordered Stop   03/18/18 1800  cefTRIAXone (ROCEPHIN) 2 g in sodium chloride 0.9 % 100 mL IVPB     2 g 200 mL/hr over 30 Minutes Intravenous Every 24 hours 03/18/18 1727     03/18/18 1000  azithromycin (ZITHROMAX) tablet 500 mg  Status:  Discontinued     500 mg Oral Daily 03/18/18 0853 03/18/18 1730   03/17/18 2300  oseltamivir (TAMIFLU) capsule 75 mg     75 mg Oral  Once 03/17/18 2255 03/17/18 2308      Ovidio Kin, MD, FACS Pager: [phone] [facility]: [phone] 03/21/2018

## 2018-03-22 ENCOUNTER — Inpatient Hospital Stay (HOSPITAL_COMMUNITY): Payer: Medicare Other

## 2018-03-22 LAB — CBC
HEMATOCRIT: 37.7 % (ref 36.0–46.0)
Hemoglobin: 11.7 g/dL — ABNORMAL LOW (ref 12.0–15.0)
MCH: 31 pg (ref 26.0–34.0)
MCHC: 31 g/dL (ref 30.0–36.0)
MCV: 99.7 fL (ref 80.0–100.0)
Platelets: 147 10*3/uL — ABNORMAL LOW (ref 150–400)
RBC: 3.78 MIL/uL — ABNORMAL LOW (ref 3.87–5.11)
RDW: 15.1 % (ref 11.5–15.5)
WBC: 5.1 10*3/uL (ref 4.0–10.5)
nRBC: 0 % (ref 0.0–0.2)

## 2018-03-22 LAB — COMPREHENSIVE METABOLIC PANEL
ALT: 25 U/L (ref 0–44)
AST: 31 U/L (ref 15–41)
Albumin: 2.9 g/dL — ABNORMAL LOW (ref 3.5–5.0)
Alkaline Phosphatase: 78 U/L (ref 38–126)
Anion gap: 8 (ref 5–15)
BUN: 6 mg/dL — ABNORMAL LOW (ref 8–23)
CO2: 21 mmol/L — ABNORMAL LOW (ref 22–32)
Calcium: 8.5 mg/dL — ABNORMAL LOW (ref 8.9–10.3)
Chloride: 112 mmol/L — ABNORMAL HIGH (ref 98–111)
Creatinine, Ser: 0.56 mg/dL (ref 0.44–1.00)
GFR calc Af Amer: >60 mL/min (ref >60)
Glucose, Bld: 101 mg/dL — ABNORMAL HIGH (ref 70–99)
Potassium: 3.6 mmol/L (ref 3.5–5.1)
Sodium: 141 mmol/L (ref 135–145)
TOTAL PROTEIN: 5.5 g/dL — AB (ref 6.5–8.1)
Total Bilirubin: 0.6 mg/dL (ref 0.3–1.2)

## 2018-03-22 MED ORDER — IPRATROPIUM-ALBUTEROL 0.5-2.5 (3) MG/3ML IN SOLN: 3.0000 mL | Freq: Four times a day (QID) | RESPIRATORY_TRACT | Status: DC | PRN

## 2018-03-22 MED ORDER — TECHNETIUM TC 99M MEBROFENIN IV KIT: 5.4000 | PACK | Freq: Once | INTRAVENOUS | Status: DC | PRN

## 2018-03-22 MED ORDER — CEPHALEXIN 500 MG PO CAPS
500.0000 mg | ORAL_CAPSULE | Freq: Three times a day (TID) | ORAL | Status: DC
  Administered 2018-03-22 – 2018-03-24 (×7): 500 mg via ORAL
  Filled 2018-03-22 (×8): qty 1

## 2018-03-22 NOTE — Final Consult Note (Signed)
Consultant Final Sign-Off Note    Assessment/Final recommendations  Jeanne Lopez is a 85 y.o. female followed by me for possible cholecystitis. HIDA scan negative and urine culture with > 100k colonies of E.coli. UTI seems source of E.coli bacteremia.   If having biliary type symptoms in the future - pain or nausea after eating, can follow up as an outpatient in [facility] office.   Wound care (if applicable): N/A   Diet at discharge: low fat diet   Activity at discharge: per primary team   Follow-up appointment:  Can follow up prn with [facility] if elective cholecystectomy desired   Pending results:  Unresulted Labs (From admission, onward)   None       Medication recommendations: Continue PPI for possible duodenitis   Other recommendations: None    Thank you for allowing [place] to participate in the care of your patient!  Please consult [place] again if you have further needs for your patient.  Tresa Endo Rayburn 03/22/2018 1:17 PM    Subjective   Patient denies abdominal pain. Tolerating diet, denies nausea or vomiting. Did not have worse pain with HIDA although test made her feel anxious.    Objective  Vital signs in last 24 hours: Temp:  [98.1 F (36.7 C)-98.4 F (36.9 C)] 98.4 F (36.9 C) (02/10 0608) Pulse Rate:  [56-60] 60 (02/10 0608) Resp:  [14-16] 16 (02/10 0608) BP: (140-148)/(67-81) 142/81 (02/10 0608) SpO2:  [95 %-98 %] 96 % (02/10 0936)  General: alert, NAD, pleasant Resp: normal effort Abd: soft, NT, ND, negative Murphy's  Skin: warm, dry, intact, no rash   Pertinent labs and Studies: Recent Labs    03/20/18 0357 03/21/18 0443 03/22/18 0424  WBC 6.2 5.0 5.1  HGB 12.4 11.4* 11.7*  HCT 39.2 37.4 37.7   BMET Recent Labs    03/21/18 0443 03/22/18 0424  NA 140 141  K 3.8 3.6  CL 113* 112*  CO2 21* 21*  GLUCOSE 88 101*  BUN 6* 6*  CREATININE 0.57 0.56  CALCIUM 8.1* 8.5*   No results for input(s): LABURIN in the last 72 hours. Results for orders  placed or performed during the hospital encounter of 03/17/18  Culture, Urine     Status: Abnormal   Collection Time: 03/17/18  9:51 PM  Result Value Ref Range Status   Specimen Description URINE, RANDOM  Final   Special Requests   Final    NONE Performed at [facility], [address], [place], [place] [address]    Culture >=100,000 COLONIES/mL ESCHERICHIA COLI (A)  Final   Report Status 03/21/2018 FINAL  Final   Organism ID, Bacteria ESCHERICHIA COLI (A)  Final      Susceptibility   Escherichia coli - MIC*    AMPICILLIN <=2 SENSITIVE Sensitive     CEFAZOLIN <=4 SENSITIVE Sensitive     CEFTRIAXONE <=1 SENSITIVE Sensitive     CIPROFLOXACIN <=0.25 SENSITIVE Sensitive     GENTAMICIN <=1 SENSITIVE Sensitive     IMIPENEM <=0.25 SENSITIVE Sensitive     NITROFURANTOIN <=16 SENSITIVE Sensitive     TRIMETH/SULFA <=20 SENSITIVE Sensitive     AMPICILLIN/SULBACTAM <=2 SENSITIVE Sensitive     PIP/TAZO <=4 SENSITIVE Sensitive     Extended ESBL NEGATIVE Sensitive     * >=100,000 COLONIES/mL ESCHERICHIA COLI  Respiratory Panel by PCR     Status: Abnormal   Collection Time: 03/18/18  1:14 AM  Result Value Ref Range Status   Adenovirus NOT DETECTED NOT DETECTED Final  Coronavirus 229E NOT DETECTED NOT DETECTED Final    Comment: (NOTE) The Coronavirus on the Respiratory Panel, DOES NOT test for the novel  Coronavirus (2019 nCoV)    Coronavirus HKU1 NOT DETECTED NOT DETECTED Final   Coronavirus NL63 NOT DETECTED NOT DETECTED Final   Coronavirus OC43 NOT DETECTED NOT DETECTED Final   Metapneumovirus DETECTED (A) NOT DETECTED Final   Rhinovirus / Enterovirus NOT DETECTED NOT DETECTED Final   Influenza A NOT DETECTED NOT DETECTED Final   Influenza B NOT DETECTED NOT DETECTED Final   Parainfluenza Virus 1 NOT DETECTED NOT DETECTED Final   Parainfluenza Virus 2 NOT DETECTED NOT DETECTED Final   Parainfluenza Virus 3 NOT DETECTED NOT DETECTED Final   Parainfluenza Virus 4 NOT  DETECTED NOT DETECTED Final   Respiratory Syncytial Virus NOT DETECTED NOT DETECTED Final   Bordetella pertussis NOT DETECTED NOT DETECTED Final   Chlamydophila pneumoniae NOT DETECTED NOT DETECTED Final   Mycoplasma pneumoniae NOT DETECTED NOT DETECTED Final    Comment: Performed at [facility], [address] [place], [place] [address]  Culture, blood (routine x 2)     Status: Abnormal   Collection Time: 03/18/18  1:14 AM  Result Value Ref Range Status   Specimen Description   Final    BLOOD BLOOD LEFT FOREARM Performed at [facility], [address] [place], [place] [address]    Special Requests   Final    BOTTLES DRAWN AEROBIC ONLY Blood Culture adequate volume Performed at [facility], [address] [place], [place] [address]    Culture  Setup Time   Final    GRAM NEGATIVE RODS AEROBIC BOTTLE ONLY CRITICAL RESULT CALLED TO, READ BACK BY AND VERIFIED WITH: Fenton FoyA. Pham PharmD 17:20 03/18/18 ([email or web address]) Performed at [facility], [address] [place], [place] [address]    Culture ESCHERICHIA COLI (A)  Final   Report Status 03/20/2018 FINAL  Final   Organism ID, Bacteria ESCHERICHIA COLI  Final      Susceptibility   Escherichia coli - MIC*    AMPICILLIN 16 INTERMEDIATE Intermediate     CEFAZOLIN <=4 SENSITIVE Sensitive     CEFEPIME <=1 SENSITIVE Sensitive     CEFTAZIDIME <=1 SENSITIVE Sensitive     CEFTRIAXONE <=1 SENSITIVE Sensitive     CIPROFLOXACIN <=0.25 SENSITIVE Sensitive     GENTAMICIN <=1 SENSITIVE Sensitive     IMIPENEM <=0.25 SENSITIVE Sensitive     TRIMETH/SULFA <=20 SENSITIVE Sensitive     AMPICILLIN/SULBACTAM 8 SENSITIVE Sensitive     PIP/TAZO <=4 SENSITIVE Sensitive     Extended ESBL NEGATIVE Sensitive     * ESCHERICHIA COLI  Culture, blood (routine x 2)     Status: Abnormal   Collection Time: 03/18/18  1:14 AM  Result Value Ref Range Status   Specimen Description   Final    BLOOD BLOOD RIGHT  HAND Performed at [facility], [address] [place], [place] [address]    Special Requests   Final    BOTTLES DRAWN AEROBIC ONLY Blood Culture adequate volume Performed at [facility], [address] [place], [place] [address]    Culture  Setup Time   Final    GRAM NEGATIVE RODS AEROBIC BOTTLE ONLY CRITICAL VALUE NOTED.  VALUE IS CONSISTENT WITH PREVIOUSLY REPORTED AND CALLED VALUE.    Culture (A)  Final    ESCHERICHIA COLI SUSCEPTIBILITIES PERFORMED ON PREVIOUS CULTURE WITHIN THE LAST 5 DAYS.  Performed at [facility], [address] [place], [place] [address]    Report Status 03/20/2018 FINAL  Final  Blood Culture ID Panel (Reflexed)     Status: Abnormal   Collection Time: 03/18/18  1:14 AM  Result Value Ref Range Status   Enterococcus species NOT DETECTED NOT DETECTED Final   Listeria monocytogenes NOT DETECTED NOT DETECTED Final   Staphylococcus species NOT DETECTED NOT DETECTED Final   Staphylococcus aureus (BCID) NOT DETECTED NOT DETECTED Final   Streptococcus species NOT DETECTED NOT DETECTED Final   Streptococcus agalactiae NOT DETECTED NOT DETECTED Final   Streptococcus pneumoniae NOT DETECTED NOT DETECTED Final   Streptococcus pyogenes NOT DETECTED NOT DETECTED Final   Acinetobacter baumannii NOT DETECTED NOT DETECTED Final   Enterobacteriaceae species DETECTED (A) NOT DETECTED Final    Comment: Enterobacteriaceae represent a large family of gram-negative bacteria, not a single organism. CRITICAL RESULT CALLED TO, READ BACK BY AND VERIFIED WITH: Fenton Foy PharmD 17:20 03/18/18 ([email or web address])    Enterobacter cloacae complex NOT DETECTED NOT DETECTED Final   Escherichia coli DETECTED (A) NOT DETECTED Final    Comment: CRITICAL RESULT CALLED TO, READ BACK BY AND VERIFIED WITH: Fenton Foy PharmD 17:20 03/18/18 ([email or web address])    Klebsiella oxytoca NOT DETECTED NOT DETECTED Final   Klebsiella pneumoniae NOT DETECTED NOT DETECTED Final    Proteus species NOT DETECTED NOT DETECTED Final   Serratia marcescens NOT DETECTED NOT DETECTED Final   Carbapenem resistance NOT DETECTED NOT DETECTED Final   Haemophilus influenzae NOT DETECTED NOT DETECTED Final   Neisseria meningitidis NOT DETECTED NOT DETECTED Final   Pseudomonas aeruginosa NOT DETECTED NOT DETECTED Final   Candida albicans NOT DETECTED NOT DETECTED Final   Candida glabrata NOT DETECTED NOT DETECTED Final   Candida krusei NOT DETECTED NOT DETECTED Final   Candida parapsilosis NOT DETECTED NOT DETECTED Final   Candida tropicalis NOT DETECTED NOT DETECTED Final    Comment: Performed at [facility], [address] [place], [place] [address]    Imaging: Nm Hepatobiliary Liver Func  Result Date: 03/22/2018 CLINICAL DATA:  Right upper quadrant abdominal pain EXAM: NUCLEAR MEDICINE HEPATOBILIARY IMAGING TECHNIQUE: Sequential images of the abdomen were obtained out to 60 minutes following intravenous administration of radiopharmaceutical. RADIOPHARMACEUTICALS:  5.4 mCi Tc-104m  Choletec IV COMPARISON:  Abdominal ultrasound 03/19/2018 FINDINGS: There is satisfactory uptake of radiopharmaceutical from the blood pool. Biliary activity is visible 11 minutes. Gallbladder activity is visible at 19 minutes and appears to proceed bowel activity significantly, although is less conspicuous on the later images. Bowel activity is visible at 30 minutes. There is extensive motion on the images as the patient was unable to remain stationary IMPRESSION: 1. Normal hepatobiliary scan, with patent cystic duct and patent common bile duct. Normal uptake of radiopharmaceutical from the blood. 2. Reduced diagnostic sensitivity and specificity due to extensive motion artifact. Electronically Signed   By: Gaylyn Rong M.D.   On: 03/22/2018 13:11

## 2018-03-22 NOTE — Care Management Important Message (Signed)
Important Message  Patient Details  Name: TANEIL LAWTER MRN: [id number] Date of Birth: December 17, 1932   Medicare Important Message Given:  Yes    Caren Macadam 03/22/2018, 12:06 PMImportant Message  Patient Details  Name: ELNORA PULSIFER MRN: [id number] Date of Birth: [birth year]   Medicare Important Message Given:  Yes    Caren Macadam 03/22/2018, 12:06 PM

## 2018-03-22 NOTE — Progress Notes (Signed)
[facility] PROGRESS NOTE  Jeanne Lopez VCB:[id number] DOB: [birth year] DOA: 03/17/2018  PCP: Merri Brunette, MD  Brief History/Interval Summary: 85 year old woman with medical problems including dementia who is independent in her ADLs at baseline, anxiety/depression, migraine headaches, osteoporosis, insomnia who presented with a constellation of symptoms including upper airway congestion, cough, fever, and confusion.    She was found to be febrile with a temperature of 103.5 F.  WBC was noted to be elevated.  No clear source of infection was identified.  Patient was hospitalized for further management.  Reason for Visit: E. coli bacteremia.   Consultants: General surgery  Procedures: None  Antibiotics: Given a dose of azithromycin on 2/6 Changed over to ceftriaxone due to bacteremia  Subjective/Interval History: Patient states that she feels well.  Denies any nausea vomiting.  She ate well yesterday.  No abdominal pain.  She ambulated.     ROS: Denies any headaches  Objective:  Vital Signs  Vitals:   03/21/18 1445 03/21/18 1937 03/21/18 2151 03/22/18 0608  BP: (!) 148/67  140/68 (!) 142/81  Pulse: (!) 56  (!) 59 60  Resp: 14  16 16   Temp: 98.1 F (36.7 C)  98.2 F (36.8 C) 98.4 F (36.9 C)  TempSrc:      SpO2: 97% 98% 97% 95%  Weight:      Height:        Intake/Output Summary (Last 24 hours) at 03/22/2018 0845 Last data filed at 03/22/2018 3846 Gross per 24 hour  Intake 2014.78 ml  Output 501 ml  Net 1513.78 ml   Filed Weights   03/18/18 0432 03/19/18 0500 03/20/18 0640  Weight: 51.3 kg 52.5 kg 54.2 kg   General appearance: Awake alert.  In no distress.  Mildly distracted Resp: Clear to auscultation bilaterally.  Normal effort Cardio: S1-S2 is normal regular.  No S3-S4.  No rubs murmurs or bruit GI: Abdomen is soft.  Nontender nondistended.  Bowel sounds are present normal.  No masses organomegaly Extremities: No edema.  Full range of motion of  lower extremities. Neurologic: Alert and oriented x3.  No focal neurological deficits.    Lab Results:  Data Reviewed: I have personally reviewed following labs and imaging studies  CBC: Recent Labs  Lab 03/17/18 1944 03/19/18 0517 03/20/18 0357 03/21/18 0443 03/22/18 0424  WBC 12.3* 8.6 6.2 5.0 5.1  NEUTROABS 11.1*  --   --   --   --   HGB 14.0 11.4* 12.4 11.4* 11.7*  HCT 44.4 37.5 39.2 37.4 37.7  MCV 97.6 101.1* 99.2 101.4* 99.7  PLT 136* 90* 108* 109* 147*    Basic Metabolic Panel: Recent Labs  Lab 03/17/18 1944 03/19/18 0517 03/20/18 0357 03/21/18 0443 03/22/18 0424  NA 137 135 139 140 141  K 3.7 3.8 3.3* 3.8 3.6  CL 105 108 109 113* 112*  CO2 22 21* 21* 21* 21*  GLUCOSE 132* 93 94 88 101*  BUN 18 20 12  6* 6*  CREATININE 0.73 0.73 0.63 0.57 0.56  CALCIUM 8.8* 7.5* 8.2* 8.1* 8.5*    GFR: Estimated Creatinine Clearance: 42.1 mL/min (by C-G formula based on SCr of 0.56 mg/dL).  Liver Function Tests: Recent Labs  Lab 03/17/18 1944 03/19/18 0517 03/20/18 0357 03/21/18 0443 03/22/18 0424  AST 54* 41 38 38 31  ALT 33 28 28 26 25   ALKPHOS 92 72 86 75 78  BILITOT 1.2 1.1 0.8 0.7 0.6  PROT 6.4* 5.3* 5.7* 5.2* 5.5*  ALBUMIN 3.7 2.9*  3.0* 2.8* 2.9*     Recent Results (from the past 240 hour(s))  Culture, Urine     Status: Abnormal   Collection Time: 03/17/18  9:51 PM  Result Value Ref Range Status   Specimen Description URINE, RANDOM  Final   Special Requests   Final    NONE Performed at [facility], [address] [place], [place] [address]    Culture >=100,000 COLONIES/mL ESCHERICHIA COLI (A)  Final   Report Status 03/21/2018 FINAL  Final   Organism ID, Bacteria ESCHERICHIA COLI (A)  Final      Susceptibility   Escherichia coli - MIC*    AMPICILLIN <=2 SENSITIVE Sensitive     CEFAZOLIN <=4 SENSITIVE Sensitive     CEFTRIAXONE <=1 SENSITIVE Sensitive     CIPROFLOXACIN <=0.25 SENSITIVE Sensitive     GENTAMICIN <=1 SENSITIVE  Sensitive     IMIPENEM <=0.25 SENSITIVE Sensitive     NITROFURANTOIN <=16 SENSITIVE Sensitive     TRIMETH/SULFA <=20 SENSITIVE Sensitive     AMPICILLIN/SULBACTAM <=2 SENSITIVE Sensitive     PIP/TAZO <=4 SENSITIVE Sensitive     Extended ESBL NEGATIVE Sensitive     * >=100,000 COLONIES/mL ESCHERICHIA COLI  Respiratory Panel by PCR     Status: Abnormal   Collection Time: 03/18/18  1:14 AM  Result Value Ref Range Status   Adenovirus NOT DETECTED NOT DETECTED Final   Coronavirus 229E NOT DETECTED NOT DETECTED Final    Comment: (NOTE) The Coronavirus on the Respiratory Panel, DOES NOT test for the novel  Coronavirus (2019 nCoV)    Coronavirus HKU1 NOT DETECTED NOT DETECTED Final   Coronavirus NL63 NOT DETECTED NOT DETECTED Final   Coronavirus OC43 NOT DETECTED NOT DETECTED Final   Metapneumovirus DETECTED (A) NOT DETECTED Final   Rhinovirus / Enterovirus NOT DETECTED NOT DETECTED Final   Influenza A NOT DETECTED NOT DETECTED Final   Influenza B NOT DETECTED NOT DETECTED Final   Parainfluenza Virus 1 NOT DETECTED NOT DETECTED Final   Parainfluenza Virus 2 NOT DETECTED NOT DETECTED Final   Parainfluenza Virus 3 NOT DETECTED NOT DETECTED Final   Parainfluenza Virus 4 NOT DETECTED NOT DETECTED Final   Respiratory Syncytial Virus NOT DETECTED NOT DETECTED Final   Bordetella pertussis NOT DETECTED NOT DETECTED Final   Chlamydophila pneumoniae NOT DETECTED NOT DETECTED Final   Mycoplasma pneumoniae NOT DETECTED NOT DETECTED Final    Comment: Performed at [facility], [address] [place], [place] [address]  Culture, blood (routine x 2)     Status: Abnormal   Collection Time: 03/18/18  1:14 AM  Result Value Ref Range Status   Specimen Description   Final    BLOOD BLOOD LEFT FOREARM Performed at [facility], [address] [place], [place] [address]    Special Requests   Final    BOTTLES DRAWN AEROBIC ONLY Blood Culture adequate volume Performed at [facility], [address] [place], [place] [address]    Culture  Setup Time   Final    GRAM NEGATIVE RODS AEROBIC BOTTLE ONLY CRITICAL RESULT CALLED TO, READ BACK BY AND VERIFIED WITH: Fenton FoyA. Pham PharmD 17:20 03/18/18 ([email or web address]) Performed at [facility], [address] [place], [place] [address]    Culture ESCHERICHIA COLI (A)  Final   Report Status 03/20/2018 FINAL  Final   Organism ID, Bacteria ESCHERICHIA COLI  Final      Susceptibility   Escherichia coli - MIC*  AMPICILLIN 16 INTERMEDIATE Intermediate     CEFAZOLIN <=4 SENSITIVE Sensitive     CEFEPIME <=1 SENSITIVE Sensitive     CEFTAZIDIME <=1 SENSITIVE Sensitive     CEFTRIAXONE <=1 SENSITIVE Sensitive     CIPROFLOXACIN <=0.25 SENSITIVE Sensitive     GENTAMICIN <=1 SENSITIVE Sensitive     IMIPENEM <=0.25 SENSITIVE Sensitive     TRIMETH/SULFA <=20 SENSITIVE Sensitive     AMPICILLIN/SULBACTAM 8 SENSITIVE Sensitive     PIP/TAZO <=4 SENSITIVE Sensitive     Extended ESBL NEGATIVE Sensitive     * ESCHERICHIA COLI  Culture, blood (routine x 2)     Status: Abnormal   Collection Time: 03/18/18  1:14 AM  Result Value Ref Range Status   Specimen Description   Final    BLOOD BLOOD RIGHT HAND Performed at [facility], [address], [place], [place] [address]    Special Requests   Final    BOTTLES DRAWN AEROBIC ONLY Blood Culture adequate volume Performed at [facility], [address] [place], [place] [address]    Culture  Setup Time   Final    GRAM NEGATIVE RODS AEROBIC BOTTLE ONLY CRITICAL VALUE NOTED.  VALUE IS CONSISTENT WITH PREVIOUSLY REPORTED AND CALLED VALUE.    Culture (A)  Final    ESCHERICHIA COLI SUSCEPTIBILITIES PERFORMED ON PREVIOUS CULTURE WITHIN THE LAST 5 DAYS. Performed at [facility], [address] [place], [place] [address]    Report Status 03/20/2018 FINAL  Final  Blood Culture ID Panel (Reflexed)     Status: Abnormal   Collection  Time: 03/18/18  1:14 AM  Result Value Ref Range Status   Enterococcus species NOT DETECTED NOT DETECTED Final   Listeria monocytogenes NOT DETECTED NOT DETECTED Final   Staphylococcus species NOT DETECTED NOT DETECTED Final   Staphylococcus aureus (BCID) NOT DETECTED NOT DETECTED Final   Streptococcus species NOT DETECTED NOT DETECTED Final   Streptococcus agalactiae NOT DETECTED NOT DETECTED Final   Streptococcus pneumoniae NOT DETECTED NOT DETECTED Final   Streptococcus pyogenes NOT DETECTED NOT DETECTED Final   Acinetobacter baumannii NOT DETECTED NOT DETECTED Final   Enterobacteriaceae species DETECTED (A) NOT DETECTED Final    Comment: Enterobacteriaceae represent a large family of gram-negative bacteria, not a single organism. CRITICAL RESULT CALLED TO, READ BACK BY AND VERIFIED WITH: Fenton Foy PharmD 17:20 03/18/18 (wilsonm)    Enterobacter cloacae complex NOT DETECTED NOT DETECTED Final   Escherichia coli DETECTED (A) NOT DETECTED Final    Comment: CRITICAL RESULT CALLED TO, READ BACK BY AND VERIFIED WITH: Fenton Foy PharmD 17:20 03/18/18 (wilsonm)    Klebsiella oxytoca NOT DETECTED NOT DETECTED Final   Klebsiella pneumoniae NOT DETECTED NOT DETECTED Final   Proteus species NOT DETECTED NOT DETECTED Final   Serratia marcescens NOT DETECTED NOT DETECTED Final   Carbapenem resistance NOT DETECTED NOT DETECTED Final   Haemophilus influenzae NOT DETECTED NOT DETECTED Final   Neisseria meningitidis NOT DETECTED NOT DETECTED Final   Pseudomonas aeruginosa NOT DETECTED NOT DETECTED Final   Candida albicans NOT DETECTED NOT DETECTED Final   Candida glabrata NOT DETECTED NOT DETECTED Final   Candida krusei NOT DETECTED NOT DETECTED Final   Candida parapsilosis NOT DETECTED NOT DETECTED Final   Candida tropicalis NOT DETECTED NOT DETECTED Final    Comment: Performed at [facility], [address], [place], [place] [address]      Radiology Studies: No results  found.   Medications:  Scheduled: . guaiFENesin  600 mg Oral BID  . ipratropium-albuterol  3 mL Nebulization BID  . pantoprazole  40 mg Oral Q1200  . polyethylene glycol  17 g Oral Daily  . sodium chloride flush  3 mL Intravenous Q12H  . traZODone  50 mg Oral QHS  . venlafaxine XR  150 mg Oral Daily   Continuous: . cefTRIAXone (ROCEPHIN)  IV Stopped (03/21/18 1812)   ZOX:[id number]:acetaminophen, albuterol, guaiFENesin-dextromethorphan, iohexol    Assessment/Plan:  Sepsis likely secondary to E. coli bacteremia/?  Biliary source/?UTI Blood cultures positive for E. coli. Patient did have some GI symptoms at presentation.  She continued to have some symptoms even after admission.  The symptoms appear to have improved over the last 48 hours. UA did not show any clear evidence for UTI.  However her urine culture is positive for E. coli. Her CT scan raise concern for cholecystitis which was also confirmed on the ultrasound.  This is somewhat of a puzzling picture. General surgery was consulted.  HIDA scan has been ordered.  Depending on the results of the HIDA scan further decisions will be made including change to antibiotic regimen. Patient's procalcitonin level was elevated and has improved.  Continue ceftriaxone for now.  WBC has been normal.  She is afebrile.  Lactic acid level was normal.  Influenza PCR was negative.    Abnormal appearing gallbladder See above as well.  This was noted on CT scan and ultrasound.  Concern raised for duodenitis as well.  Patient also placed on PPI.  General surgery has been following.  Waiting on HIDA scan.  Acute bronchitis Secondary to metapneumovirus.  Respiratory status is stable.  Still has a cough.  Saturating normal on room air.  No wheezing.  Hypotension Most likely due to combination of medication and infection.  Now resolved.  Blood pressure remained stable.  Occasionally hypertensive.  Heart rate remains low but appears to have improved slightly.  Continue to hold propranolol.  If she becomes hypotensive may need to use alternative agents.  Sinus bradycardia Most likely due to beta-blocker.  Patient also was on Aricept which could have also contributed.  TSH and free T4 normal.   Thrombocytopenia Drop in platelet counts most likely due to sepsis.  Improved.    Acute metabolic encephalopathy in the setting of dementia At the time of admission patient was noted to be more confused than her baseline.  This was most likely secondary to infection.  Now back to baseline.    History of anxiety and depression Continue home medications.  History of migraine headaches Patient is on propranolol for prophylaxis at home.  Held due to hypotension and bradycardia.  History of insomnia Trazodone.  History of dementia Aricept on hold due to bradycardia.  Hypokalemia Normal this morning.  Left renal cyst Incidentally noted on CT scan.  DVT Prophylaxis: SCDs. Code Status: DNR Family Communication: Discussed with the patient.  No family at bedside today. Disposition Plan: Await HIDA scan.  Continue to mobilize.    LOS: 3 days   Draden Cottingham [employer]  [facility] Pager on [email or web address]  03/22/2018, 8:45 AM

## 2018-03-23 MED ORDER — TRAZODONE HCL 100 MG PO TABS: 100.0000 mg | ORAL_TABLET | Freq: Every day | ORAL | Status: DC

## 2018-03-23 MED ORDER — PANTOPRAZOLE SODIUM 40 MG PO TBEC: 40.0000 mg | DELAYED_RELEASE_TABLET | Freq: Every day | ORAL | Status: DC

## 2018-03-23 MED ORDER — AMLODIPINE BESYLATE 5 MG PO TABS: 5.0000 mg | ORAL_TABLET | Freq: Every day | ORAL | Status: DC

## 2018-03-23 MED ORDER — IPRATROPIUM-ALBUTEROL 0.5-2.5 (3) MG/3ML IN SOLN: 3.0000 mL | Freq: Four times a day (QID) | RESPIRATORY_TRACT | Status: DC | PRN

## 2018-03-23 MED ORDER — GUAIFENESIN-DM 100-10 MG/5ML PO SYRP: 5.0000 mL | ORAL_SOLUTION | ORAL | 0 refills | Status: DC | PRN

## 2018-03-23 MED ORDER — AMLODIPINE BESYLATE 5 MG PO TABS
5.0000 mg | ORAL_TABLET | Freq: Every day | ORAL | Status: DC
  Administered 2018-03-23: 5 mg via ORAL
  Filled 2018-03-23 (×2): qty 1

## 2018-03-23 MED ORDER — GUAIFENESIN ER 600 MG PO TB12: 600.0000 mg | ORAL_TABLET | Freq: Two times a day (BID) | ORAL | Status: DC

## 2018-03-23 MED ORDER — CEPHALEXIN 500 MG PO CAPS: 500.0000 mg | ORAL_CAPSULE | Freq: Three times a day (TID) | ORAL | Status: DC

## 2018-03-23 NOTE — Progress Notes (Signed)
Patient was transferred from [address] Alert and oriented x 4. No pain complained. Daughter is at bedside. Vital signs was taken. Room is set up and call light is within patient's reach. Will monitor patient as protocol.

## 2018-03-23 NOTE — Clinical Social Work Note (Addendum)
Clinical Social Work Assessment  Patient Details  Name: Jeanne Lopez MRN: [id number] Date of Birth: [birth year]  Date of referral:  03/23/18               Reason for consult:  Discharge Planning                Permission sought to share information with:  Family Supports Permission granted to share information::  Yes, Verbal Permission Granted  Name::       Jeanne Lopez  Agency::  Skilled Nursing Placement   Relationship::     Contact Information:    [phone]  Housing/Transportation Living arrangements for the past 2 months:  [place] of Information:  Patient, Adult Children Patient Interpreter Needed:  None Criminal Activity/Legal Involvement Pertinent to Current Situation/Hospitalization:  No - Comment as needed Significant Relationships:  Adult Children Lives with:   Daughter  Do you feel safe going back to the place where you live?  Yes Need for family participation in patient care:  Yes (Comment)  Care giving concerns:   Patient family is concern about the patient returning home without first going to SNF for rehab.  OT Recommends 24/7 initially vs SNF to regain strength and balance prior to being home alone when daughter is at work.   Social Worker assessment / plan:  CSW met with the patient at bedside, explain role and reason for visit. Patient family is concern about the patient returning home without support. The patient is usually independent with ADL's but is now requiring assistance. Per daughter the family cannot provide 24/7 assistance.Patient has memory defitsits and often forgets to eat her lunch or drink water during the day. The patient daughter reports she is hopeful the patient rehab stay will allow the patient to regain her strength before returning home.   CSW explain SNF process and will follow up with bed offers. The patient will prior insurance authorization prior to discharging. CSW discussed with the physician.   FL2 Level II   PASRR completed.   Plan: SNF  Employment status:  Retired Forensic scientist:  Medicare PT Recommendations:  [place] / Referral to community resources:  [place]  Patient/Family's Response to care:  Agreeable and Responding well to care.   Patient/Family's Understanding of and Emotional Response to Diagnosis, Current Treatment, and Prognosis: Patient alert x4. Patient defers to her daughter for understanding. Patient daughter at bedside and very involved in the patient care. She has been proactive in learning how to best support and take care of the patient after she is release from the hospital and SNF.   Emotional Assessment Appearance:  Appears stated age Attitude/Demeanor/Rapport:    Affect (typically observed):  Accepting, Calm Orientation:  Oriented to Self, Oriented to Place, Oriented to  Time, Oriented to Situation Alcohol / Substance use:  Not Applicable Psych involvement (Current and /or in the community):  No (Comment)  Discharge Needs  Concerns to be addressed:  Discharge Planning Concerns Readmission within the last 30 days:  No Current discharge risk:  Dependent with Mobility, Physical Impairment Barriers to Discharge:  Continued Medical Work up, Ship broker, Programmer, applications ([place])   Lia Hopping, LCSW 03/23/2018, 12:56 PM

## 2018-03-23 NOTE — Discharge Instructions (Signed)
Urinary Tract Infection, Adult A urinary tract infection (UTI) is an infection of any part of the urinary tract. The urinary tract includes:  The kidneys.  The ureters.  The bladder.  The urethra. These organs make, store, and get rid of pee (urine) in the body. What are the causes? This is caused by germs (bacteria) in your genital area. These germs grow and cause swelling (inflammation) of your urinary tract. What increases the risk? You are more likely to develop this condition if:  You have a small, thin tube (catheter) to drain pee.  You cannot control when you pee or poop (incontinence).  You are female, and: ? You use these methods to prevent pregnancy: ? A medicine that kills sperm (spermicide). ? A device that blocks sperm (diaphragm). ? You have low levels of a female hormone (estrogen). ? You are pregnant.  You have genes that add to your risk.  You are sexually active.  You take antibiotic medicines.  You have trouble peeing because of: ? A prostate that is bigger than normal, if you are female. ? A blockage in the part of your body that drains pee from the bladder (urethra). ? A kidney stone. ? A nerve condition that affects your bladder (neurogenic bladder). ? Not getting enough to drink. ? Not peeing often enough.  You have other conditions, such as: ? Diabetes. ? A weak disease-fighting system (immune system). ? Sickle cell disease. ? Gout. ? Injury of the spine. What are the signs or symptoms? Symptoms of this condition include:  Needing to pee right away (urgently).  Peeing often.  Peeing small amounts often.  Pain or burning when peeing.  Blood in the pee.  Pee that smells bad or not like normal.  Trouble peeing.  Pee that is cloudy.  Fluid coming from the vagina, if you are female.  Pain in the belly or lower back. Other symptoms include:  Throwing up (vomiting).  No urge to eat.  Feeling mixed up (confused).  Being tired  and grouchy (irritable).  A fever.  Watery poop (diarrhea). How is this treated? This condition may be treated with:  Antibiotic medicine.  Other medicines.  Drinking enough water. Follow these instructions at home:  Medicines  Take over-the-counter and prescription medicines only as told by your doctor.  If you were prescribed an antibiotic medicine, take it as told by your doctor. Do not stop taking it even if you start to feel better. General instructions  Make sure you: ? Pee until your bladder is empty. ? Do not hold pee for a long time. ? Empty your bladder after sex. ? Wipe from front to back after pooping if you are a female. Use each tissue one time when you wipe.  Drink enough fluid to keep your pee pale yellow.  Keep all follow-up visits as told by your doctor. This is important. Contact a doctor if:  You do not get better after 1-2 days.  Your symptoms go away and then come back. Get help right away if:  You have very bad back pain.  You have very bad pain in your lower belly.  You have a fever.  You are sick to your stomach (nauseous).  You are throwing up. Summary  A urinary tract infection (UTI) is an infection of any part of the urinary tract.  This condition is caused by germs in your genital area.  There are many risk factors for a UTI. These include having a small, thin   tube to drain pee and not being able to control when you pee or poop.  Treatment includes antibiotic medicines for germs.  Drink enough fluid to keep your pee pale yellow. This information is not intended to replace advice given to you by your health care provider. Make sure you discuss any questions you have with your health care provider. Document Released: 07/16/2007 Document Revised: 08/06/2017 Document Reviewed: 08/06/2017 Elsevier Interactive Patient Education  2019 [employer]vier Inc.  

## 2018-03-23 NOTE — Progress Notes (Signed)
Patient and daughter agreeable to [facility] for rehab. SNF started insurance authorization process. Social worker will inform medical team once received.

## 2018-03-23 NOTE — Plan of Care (Signed)
Plan of care discussed with the patient and her daughter. 

## 2018-03-23 NOTE — Evaluation (Signed)
Physical Therapy Evaluation Patient Details Name: Jeanne Lopez MRN: [id number] DOB: 21-Jul-1932 Today's Date: 03/23/2018   History of Present Illness  pt was admitted with fever, congestion, cough.  Found to have e coli bacteremia.  PMH:  dementia, anxiety, depression  Clinical Impression  Pt admitted with above diagnosis. Pt currently with functional limitations due to the deficits listed below (see PT Problem List). * Pt very pleasant and cooperative. amb in hallway with min to min/guard assist for balance. LOB x4 with delayed recovery, stability improved with distance. Will follow in acute setting. SNF vs HHPT depending on progress and home support   Pt will benefit from skilled PT to increase their independence and safety with mobility to allow discharge to the venue listed below.       Follow Up Recommendations Home health PT;SNF(depending on progress/home support)    Equipment Recommendations  Other (comment)(TBA)    Recommendations for Other Services       Precautions / Restrictions Precautions Precautions: Fall      Mobility  Bed Mobility Overal bed mobility: Independent                Transfers Overall transfer level: Needs assistance Equipment used: None;1 person hand held assist Transfers: Sit to/from Stand Sit to Stand: Min guard;Min assist         General transfer comment: light assist to steady on rising   Ambulation/Gait Ambulation/Gait assistance: Min guard;Min assist Gait Distance (Feet): 280 Feet Assistive device: 1 person hand held assist;None Gait Pattern/deviations: Step-to pattern;Step-through pattern;Drifts right/left;Decreased stride length        Stairs            Wheelchair Mobility    Modified Rankin (Stroke Patients Only)       Balance Overall balance assessment: Needs assistance         Standing balance support: No upper extremity supported Standing balance-Leahy Scale: Fair Standing balance comment: poor to  fair                              Pertinent Vitals/Pain Pain Assessment: No/denies pain    Home Living Family/patient expects to be discharged to:: Private residence Living Arrangements: Alone(unsure if info accurate)             Home Equipment: None Additional Comments: daughter works     Prior Function Level of Independence: Independent         Comments: IND ambulator, does not use AD, no family present to confirm pt PLOF     Hand Dominance        Extremity/Trunk Assessment   Upper Extremity Assessment Upper Extremity Assessment: Generalized weakness;Defer to OT evaluation    Lower Extremity Assessment Lower Extremity Assessment: Generalized weakness       Communication   Communication: No difficulties  Cognition Arousal/Alertness: Awake/alert Behavior During Therapy: WFL for tasks assessed/performed Overall Cognitive Status: No family/caregiver present to determine baseline cognitive functioning                                 General Comments: h/o mild dementia; had some difficulty with home questions      General Comments      Exercises     Assessment/Plan    PT Assessment Patient needs continued PT services  PT Problem List Decreased strength;Decreased mobility;Decreased balance;Decreased knowledge of use of DME;Decreased activity tolerance;Decreased cognition  PT Treatment Interventions DME instruction;Gait training;Functional mobility training;Therapeutic activities;Therapeutic exercise;Patient/family education    PT Goals (Current goals can be found in the Care Plan section)  Acute Rehab PT Goals Patient Stated Goal: go home (and see dog) PT Goal Formulation: With patient Time For Goal Achievement: 04/06/18 Potential to Achieve Goals: Good    Frequency Min 3X/week   Barriers to discharge        Co-evaluation               AM-PAC PT "6 Clicks" Mobility  Outcome Measure Help needed turning  from your back to your side while in a flat bed without using bedrails?: A Little Help needed moving from lying on your back to sitting on the side of a flat bed without using bedrails?: A Little Help needed moving to and from a bed to a chair (including a wheelchair)?: A Little Help needed standing up from a chair using your arms (e.g., wheelchair or bedside chair)?: A Little Help needed to walk in hospital room?: A Little Help needed climbing 3-5 steps with a railing? : A Little 6 Click Score: 18    End of Session Equipment Utilized During Treatment: Gait belt Activity Tolerance: Patient tolerated treatment well Patient left: in bed;with call bell/phone within reach;with bed alarm set Nurse Communication: Mobility status PT Visit Diagnosis: Unsteadiness on feet (R26.81)    Time: 0350-0938 PT Time Calculation (min) (ACUTE ONLY): 18 min   Charges:   PT Evaluation $PT Eval Low Complexity: 1 Low          Drucilla Chalet, PT  Pager: [phone] Acute Rehab Dept [facility]): [phone]   03/23/2018   [facility] 03/23/2018, 2:08 PM

## 2018-03-23 NOTE — NC FL2 (Signed)
[place] MEDICAID FL2 LEVEL OF CARE SCREENING TOOL     IDENTIFICATION  Patient Name: Jeanne Lopez Birthdate: 10/15/1932 Sex: female Admission Date (Current Location): 03/17/2018  [facility] [place]   [facility] and Address[facility]  [facility],  [address], [place] [id number]      Provider Number: [id number]  Attending Physician Name and Address:  Osvaldo ShipperKrishnan, Gokul, MD  Relative Name and Phone Number:       Current Level of Care: Hospital Recommended Level of Care: Skilled Nursing Facility Prior Approval Number:    Date Approved/Denied:   PASRR Number: Pending  Discharge Plan:      Current Diagnoses: Patient Active Problem List   Diagnosis Date Noted  . Sepsis (HCC) 03/19/2018  . Febrile illness 03/18/2018  . Memory disorder 04/02/2017  . Chronic constipation 08/11/2014  . Hyperlipidemia with target LDL less than 130 07/12/2014  . Migraine without aura and without status migrainosus, not intractable 07/12/2014  . Other constipation 06/06/2014  . Systolic murmur 04/03/2014  . Dementia arising in the senium and presenium (HCC) 08/16/2013  . Depression with anxiety 08/16/2013  . Osteoporosis 08/11/2013    Orientation RESPIRATION BLADDER Height & Weight     Self, Place  Normal Continent Weight: 119 lb 7.8 oz (54.2 kg) Height:  5\' 2"  (157.5 cm)  BEHAVIORAL SYMPTOMS/MOOD NEUROLOGICAL BOWEL NUTRITION STATUS      Continent Diet(Regular )  AMBULATORY STATUS COMMUNICATION OF NEEDS Skin   Extensive Assist Verbally Normal                       Personal Care Assistance Level of Assistance  Feeding, Dressing, Bathing Bathing Assistance: Limited assistance Feeding assistance: Independent Dressing Assistance: Limited assistance     Functional Limitations Info  Sight, Hearing, Speech Sight Info: Impaired Hearing Info: Adequate Speech Info: Adequate    SPECIAL CARE FACTORS FREQUENCY  PT (By licensed PT), OT (By licensed OT)      PT Frequency: 5x/week OT Frequency: 5x/week             Contractures Contractures Info: Not present    Additional Factors Info  Code Status, Allergies, Psychotropic Code Status Info: DNR  Allergies Info: Allergies: Codeine Psychotropic Info: Trazadone          Current Medications (03/23/2018):  This is the current hospital active medication list Current Facility-Administered Medications  Medication Dose Route Frequency Provider Last Rate Last Dose  . acetaminophen (TYLENOL) tablet 650 mg  650 mg Oral Q6H PRN Elder LoveKuhlman, Patrick D, MD   650 mg at 03/23/18 0644  . cephALEXin (KEFLEX) capsule 500 mg  500 mg Oral Q8H Osvaldo ShipperKrishnan, Gokul, MD   500 mg at 03/23/18 [id number]  . guaiFENesin (MUCINEX) 12 hr tablet 600 mg  600 mg Oral BID Osvaldo ShipperKrishnan, Gokul, MD   600 mg at 03/23/18 1013  . guaiFENesin-dextromethorphan (ROBITUSSIN DM) 100-10 MG/5ML syrup 5 mL  5 mL Oral Q4H PRN Osvaldo ShipperKrishnan, Gokul, MD   5 mL at 03/20/18 0349  . iohexol (OMNIPAQUE) 300 MG/ML solution 15 mL  15 mL Oral Once PRN Osvaldo ShipperKrishnan, Gokul, MD   15 mL at 03/19/18 1100  . ipratropium-albuterol (DUONEB) 0.5-2.5 (3) MG/3ML nebulizer solution 3 mL  3 mL Nebulization Q6H PRN Osvaldo ShipperKrishnan, Gokul, MD      . pantoprazole (PROTONIX) EC tablet 40 mg  40 mg Oral Q1200 Osvaldo ShipperKrishnan, Gokul, MD   40 mg at 03/22/18 1357  . polyethylene glycol (MIRALAX / GLYCOLAX) packet 17 g  17 g Oral Daily Elder Love, MD   17 g at 03/23/18 1013  . sodium chloride flush (NS) 0.9 % injection 3 mL  3 mL Intravenous Q12H Elder Love, MD   3 mL at 03/23/18 1013  . technetium TC 50M mebrofenin (CHOLETEC) injection 5.4 millicurie  5.4 millicurie Intravenous Once PRN Herbie Baltimore, MD      . traZODone (DESYREL) tablet 50 mg  50 mg Oral QHS Osvaldo Shipper, MD   50 mg at 03/22/18 2048  . venlafaxine XR (EFFEXOR-XR) 24 hr capsule 150 mg  150 mg Oral Daily Elder Love, MD   150 mg at 03/23/18 1013     Discharge Medications: Please see discharge summary for a list  of discharge medications.  Relevant Imaging Results:  Relevant Lab Results:   Additional Information ESL:[id number]  Clearance Coots, LCSW

## 2018-03-23 NOTE — Evaluation (Signed)
Occupational Therapy Evaluation Patient Details Name: Jeanne MassedLila A Lopez MRN: [id number] DOB: 10/05/1932 Today's Date: 03/23/2018    History of Present Illness pt was admitted with fever, congestion, cough.  Found to have e coli bacteremia.  PMH:  dementia, anxiety, depression   Clinical Impression   This 85 year old female was admitted for the above.  At baseline, she is independent with basic ADLs and she reports she is alone when her daughter is at work. Pt needs min guard to min A for balance during adls and toilet transfers. She had 2 LOB during this evaluation.  LOB is posterior; tried RW to see if this would bias her more anteriorly, but she didn't remember to keep it with her nor think she needed it. Recommend 24/7 initially vs SNF to regain strength and balance prior to being home alone when daughter is at work.    Follow Up Recommendations  SNF;Supervision/Assistance - 24 hour(HHOT, if home)    Equipment Recommendations  None recommended by OT    Recommendations for Other Services       Precautions / Restrictions Precautions Precautions: Fall Restrictions Weight Bearing Restrictions: No      Mobility Bed Mobility Overal bed mobility: Independent                Transfers Overall transfer level: Needs assistance Equipment used: None;Rolling walker (2 wheeled) Transfers: Sit to/from Stand Sit to Stand: Min assist;Min guard         General transfer comment: 2 LOB when standing; one with sit to stand    Balance Overall balance assessment: Needs assistance         Standing balance support: No upper extremity supported Standing balance-Leahy Scale: (poor to fair)                             ADL either performed or assessed with clinical judgement   ADL Overall ADL's : Needs assistance/impaired Eating/Feeding: Independent   Grooming: Oral care;Min guard;Standing   Upper Body Bathing: Set up;Sitting;Supervision/ safety   Lower Body  Bathing: Min guard;Minimal assistance   Upper Body Dressing : Set up;Sitting;Supervision/safety   Lower Body Dressing: Min guard;Minimal assistance;Sit to/from stand   Toilet Transfer: Min guard;Minimal assistance;Ambulation   Toileting- Clothing Manipulation and Hygiene: Min guard;Minimal assistance;Sit to/from stand         General ADL Comments: pt had 2 posterior LOB needing light min A to recover. She grabbed bedrail and counter to help right herself. Both of these occurred when she was standing statically.  Tried RW to keep her more forward, but she is not used to using it and walked away from it twice     Vision         Perception     Praxis      Pertinent Vitals/Pain Pain Assessment: No/denies pain     Hand Dominance     Extremity/Trunk Assessment Upper Extremity Assessment Upper Extremity Assessment: Generalized weakness           Communication Communication Communication: No difficulties   Cognition Arousal/Alertness: Awake/alert Behavior During Therapy: WFL for tasks assessed/performed Overall Cognitive Status: No family/caregiver present to determine baseline cognitive functioning                                 General Comments: h/o mild dementia; had some difficulty with home questions   General Comments  Exercises     Shoulder Instructions      Home Living Family/patient expects to be discharged to:: Private residence Living Arrangements: Children                 Bathroom Shower/Tub: Chief Strategy Officer: Standard     Home Equipment: None   Additional Comments: daughter works       Prior Functioning/Environment Level of Independence: Independent        Comments: assist for IADLs. Does not use AD        OT Problem List: Impaired balance (sitting and/or standing);Decreased strength;Decreased knowledge of use of DME or AE;Decreased safety awareness;Decreased cognition      OT  Treatment/Interventions: Self-care/ADL training;DME and/or AE instruction;Therapeutic activities;Balance training;Patient/family education    OT Goals(Current goals can be found in the care plan section) Acute Rehab OT Goals Patient Stated Goal: go home (and see dog) OT Goal Formulation: With patient Time For Goal Achievement: 04/06/18 Potential to Achieve Goals: Good ADL Goals Pt Will Transfer to Toilet: with supervision;ambulating;regular height toilet Pt Will Perform Toileting - Clothing Manipulation and hygiene: with supervision;sit to/from stand Additional ADL Goal #1: pt will complete adl with supervision and set up, sit to stand Additional ADL Goal #2: pt will use AD as needed with no more than 2 vcs to keep it in front of her when ambulating/standing  OT Frequency: Min 2X/week   Barriers to D/C: Decreased caregiver support          Co-evaluation              AM-PAC OT "6 Clicks" Daily Activity     Outcome Measure Help from another person eating meals?: None Help from another person taking care of personal grooming?: A Little Help from another person toileting, which includes using toliet, bedpan, or urinal?: A Little Help from another person bathing (including washing, rinsing, drying)?: A Little Help from another person to put on and taking off regular upper body clothing?: A Little Help from another person to put on and taking off regular lower body clothing?: A Little 6 Click Score: 19   End of Session Nurse Communication: (2 LOB)  Activity Tolerance: Patient tolerated treatment well Patient left: in chair;with call bell/phone within reach;with chair alarm set  OT Visit Diagnosis: Unsteadiness on feet (R26.81)                Time: 4650-3546 OT Time Calculation (min): 19 min Charges:  OT General Charges $OT Visit: 1 Visit OT Evaluation $OT Eval Low Complexity: 1 Low  Marica Otter, OTR/L Acute Rehabilitation Services [phone] [facility] pager [phone]  office 03/23/2018  Jeanne Lopez 03/23/2018, 8:55 AM

## 2018-03-23 NOTE — Discharge Summary (Signed)
[facility]  Physician Discharge Summary   Patient ID: Jeanne Lopez MRN: [id number] DOB/AGE: 83-May-1934 83 y.o.  Admit date: 03/17/2018 Discharge date: 03/23/2018  PCP: Merri Brunette, MD  DISCHARGE DIAGNOSES:  E. coli bacteremia possibly from a urinary source Abnormal appearing gallbladder, possible chronic cholecystitis History of dementia Acute bronchitis with metapneumovirus, improving Sinus bradycardia Essential hypertension History of anxiety and depression History of migraine headaches   RECOMMENDATIONS FOR OUTPATIENT FOLLOW UP: 1. Follow-up with general surgery as needed  2. Propranolol and Aricept discontinued due to significant bradycardia.   Home Health: None.  Patient going to skilled nursing facility Equipment/Devices: None  CODE STATUS: DNR  DISCHARGE CONDITION: fair  Diet recommendation: Low-sodium  INITIAL HISTORY: 85 year old woman with medical problems including dementia who is independent in her ADLs at baseline, anxiety/depression, migraine headaches, osteoporosis, insomnia who presented with a constellation of symptoms including upper airway congestion, cough, fever, and confusion.   She was found to be febrile with a temperature of 103.5 F.  WBC was noted to be elevated.  No clear source of infection was identified.  Patient was hospitalized for further management.  Consultants: General surgery  Procedures: None  Antibiotics: Given a dose of azithromycin on 2/6 Changed over to ceftriaxone due to bacteremia Changed over to Keflex   HOSPITAL COURSE:   Sepsis likely secondary to E. coli bacteremia/?  Biliary source/?UTI Blood cultures positive for E. coli. Patient did have some GI symptoms at presentation.  She continued to have some symptoms even after admission.    UA was abnormal but not completely conclusive for UTI.  However her urine culture turned positive for E. coli.  Her CT scan raise concern for cholecystitis which was  also confirmed on the ultrasound.  This was somewhat of a puzzling picture. General surgery was consulted.  HIDA scan was ordered which was a normal study.  So it does appear that her symptoms could have been due to infection from a urinary source rather than a biliary source.  Ceftriaxone changed over to Keflex.  She seems to be stable.  WBC normal.  She is afebrile. Lactic acid level was normal.  Influenza PCR was negative.    Abnormal appearing gallbladder/Possible chronic cholecystitis/Duodenitis See above as well.  This was noted on CT scan and ultrasound.  Concern raised for duodenitis as well.  Patient also placed on PPI.    HIDA scan was unremarkable.  No surgery is needed at this time.  May follow-up with general surgery in the outpatient setting.  Patient's symptoms have resolved.  Acute bronchitis Secondary to metapneumovirus.  Respiratory status is stable.  Still has a cough.  Saturating normal on room air.  No wheezing.  Essential hypertension  Patient was initially noted to be hypotensive which was most likely due to gram-negative bacteremia.  Her propranolol was held.  Blood pressure is improved.  Now in the hypertensive range.  Will initiate amlodipine instead of the beta-blocker due to her bradycardia.    Sinus bradycardia This is most likely due to propranolol as well as Aricept.  Both of these agents have been discontinued.  TSH and free T4 levels normal.  Heart rate has improved.    Thrombocytopenia Drop in platelet counts most likely due to sepsis.  Improved.    Acute metabolic encephalopathy in the setting of dementia At the time of admission patient was noted to be more confused than her baseline.  This was most likely secondary to infection.  Now back to baseline.  History of anxiety and depression Continue home medications.  History of migraine headaches Patient is on propranolol for prophylaxis at home.  Held due to hypotension and bradycardia.  History  of insomnia Trazodone.  History of dementia Aricept has been discontinued due to bradycardia.  This can be readdressed in the outpatient setting.  Hypokalemia Repleted  Left renal cyst Incidentally noted on CT scan.  Patient's daughter mentioned yesterday that they were concerned about her being alone at home for most of the day hours.  Patient was seen by PT who is recommending skilled nursing facility for short-term rehab.  Social worker consulted.  Patient has been stable.  Acute issues have resolved.  Okay for discharge to skilled nursing facility when bed is available.       PERTINENT LABS:  The results of significant diagnostics from this hospitalization (including imaging, microbiology, ancillary and laboratory) are listed below for reference.    Microbiology: Recent Results (from the past 240 hour(s))  Culture, Urine     Status: Abnormal   Collection Time: 03/17/18  9:51 PM  Result Value Ref Range Status   Specimen Description URINE, RANDOM  Final   Special Requests   Final    NONE Performed at [facility], [address] [place], [place] [address]    Culture >=100,000 COLONIES/mL ESCHERICHIA COLI (A)  Final   Report Status 03/21/2018 FINAL  Final   Organism ID, Bacteria ESCHERICHIA COLI (A)  Final      Susceptibility   Escherichia coli - MIC*    AMPICILLIN <=2 SENSITIVE Sensitive     CEFAZOLIN <=4 SENSITIVE Sensitive     CEFTRIAXONE <=1 SENSITIVE Sensitive     CIPROFLOXACIN <=0.25 SENSITIVE Sensitive     GENTAMICIN <=1 SENSITIVE Sensitive     IMIPENEM <=0.25 SENSITIVE Sensitive     NITROFURANTOIN <=16 SENSITIVE Sensitive     TRIMETH/SULFA <=20 SENSITIVE Sensitive     AMPICILLIN/SULBACTAM <=2 SENSITIVE Sensitive     PIP/TAZO <=4 SENSITIVE Sensitive     Extended ESBL NEGATIVE Sensitive     * >=100,000 COLONIES/mL ESCHERICHIA COLI  Respiratory Panel by PCR     Status: Abnormal   Collection Time: 03/18/18  1:14 AM  Result Value Ref  Range Status   Adenovirus NOT DETECTED NOT DETECTED Final   Coronavirus 229E NOT DETECTED NOT DETECTED Final    Comment: (NOTE) The Coronavirus on the Respiratory Panel, DOES NOT test for the novel  Coronavirus (2019 nCoV)    Coronavirus HKU1 NOT DETECTED NOT DETECTED Final   Coronavirus NL63 NOT DETECTED NOT DETECTED Final   Coronavirus OC43 NOT DETECTED NOT DETECTED Final   Metapneumovirus DETECTED (A) NOT DETECTED Final   Rhinovirus / Enterovirus NOT DETECTED NOT DETECTED Final   Influenza A NOT DETECTED NOT DETECTED Final   Influenza B NOT DETECTED NOT DETECTED Final   Parainfluenza Virus 1 NOT DETECTED NOT DETECTED Final   Parainfluenza Virus 2 NOT DETECTED NOT DETECTED Final   Parainfluenza Virus 3 NOT DETECTED NOT DETECTED Final   Parainfluenza Virus 4 NOT DETECTED NOT DETECTED Final   Respiratory Syncytial Virus NOT DETECTED NOT DETECTED Final   Bordetella pertussis NOT DETECTED NOT DETECTED Final   Chlamydophila pneumoniae NOT DETECTED NOT DETECTED Final   Mycoplasma pneumoniae NOT DETECTED NOT DETECTED Final    Comment: Performed at [facility], [address] [place], [place] [address]  Culture, blood (routine x 2)     Status: Abnormal   Collection Time: 03/18/18  1:14  AM  Result Value Ref Range Status   Specimen Description   Final    BLOOD BLOOD LEFT FOREARM Performed at [facility], [address] [place], [place] [address]    Special Requests   Final    BOTTLES DRAWN AEROBIC ONLY Blood Culture adequate volume Performed at [facility], [address] [place], [place] [address]    Culture  Setup Time   Final    GRAM NEGATIVE RODS AEROBIC BOTTLE ONLY CRITICAL RESULT CALLED TO, READ BACK BY AND VERIFIED WITH: Fenton Foy PharmD 17:20 03/18/18 ([email or web address]) Performed at [facility], [address], [place], [place] [address]    Culture ESCHERICHIA COLI (A)  Final   Report Status 03/20/2018 FINAL  Final   Organism ID,  Bacteria ESCHERICHIA COLI  Final      Susceptibility   Escherichia coli - MIC*    AMPICILLIN 16 INTERMEDIATE Intermediate     CEFAZOLIN <=4 SENSITIVE Sensitive     CEFEPIME <=1 SENSITIVE Sensitive     CEFTAZIDIME <=1 SENSITIVE Sensitive     CEFTRIAXONE <=1 SENSITIVE Sensitive     CIPROFLOXACIN <=0.25 SENSITIVE Sensitive     GENTAMICIN <=1 SENSITIVE Sensitive     IMIPENEM <=0.25 SENSITIVE Sensitive     TRIMETH/SULFA <=20 SENSITIVE Sensitive     AMPICILLIN/SULBACTAM 8 SENSITIVE Sensitive     PIP/TAZO <=4 SENSITIVE Sensitive     Extended ESBL NEGATIVE Sensitive     * ESCHERICHIA COLI  Culture, blood (routine x 2)     Status: Abnormal   Collection Time: 03/18/18  1:14 AM  Result Value Ref Range Status   Specimen Description   Final    BLOOD BLOOD RIGHT HAND Performed at [facility], [address] [place], [place] [address]    Special Requests   Final    BOTTLES DRAWN AEROBIC ONLY Blood Culture adequate volume Performed at [facility], [address], [place], [place] [address]    Culture  Setup Time   Final    GRAM NEGATIVE RODS AEROBIC BOTTLE ONLY CRITICAL VALUE NOTED.  VALUE IS CONSISTENT WITH PREVIOUSLY REPORTED AND CALLED VALUE.    Culture (A)  Final    ESCHERICHIA COLI SUSCEPTIBILITIES PERFORMED ON PREVIOUS CULTURE WITHIN THE LAST 5 DAYS. Performed at [facility], [address] [place], [place] [address]    Report Status 03/20/2018 FINAL  Final  Blood Culture ID Panel (Reflexed)     Status: Abnormal   Collection Time: 03/18/18  1:14 AM  Result Value Ref Range Status   Enterococcus species NOT DETECTED NOT DETECTED Final   Listeria monocytogenes NOT DETECTED NOT DETECTED Final   Staphylococcus species NOT DETECTED NOT DETECTED Final   Staphylococcus aureus (BCID) NOT DETECTED NOT DETECTED Final   Streptococcus species NOT DETECTED NOT DETECTED Final   Streptococcus agalactiae NOT DETECTED NOT DETECTED Final    Streptococcus pneumoniae NOT DETECTED NOT DETECTED Final   Streptococcus pyogenes NOT DETECTED NOT DETECTED Final   Acinetobacter baumannii NOT DETECTED NOT DETECTED Final   Enterobacteriaceae species DETECTED (A) NOT DETECTED Final    Comment: Enterobacteriaceae represent a large family of gram-negative bacteria, not a single organism. CRITICAL RESULT CALLED TO, READ BACK BY AND VERIFIED WITH: Fenton Foy PharmD 17:20 03/18/18 ([email or web address])    Enterobacter cloacae complex NOT DETECTED NOT DETECTED Final   Escherichia coli DETECTED (A) NOT DETECTED Final    Comment: CRITICAL RESULT CALLED TO, READ BACK BY AND VERIFIED WITH:  Fenton Foy PharmD 17:20 03/18/18 (wilsonm)    Klebsiella oxytoca NOT DETECTED NOT DETECTED Final   Klebsiella pneumoniae NOT DETECTED NOT DETECTED Final   Proteus species NOT DETECTED NOT DETECTED Final   Serratia marcescens NOT DETECTED NOT DETECTED Final   Carbapenem resistance NOT DETECTED NOT DETECTED Final   Haemophilus influenzae NOT DETECTED NOT DETECTED Final   Neisseria meningitidis NOT DETECTED NOT DETECTED Final   Pseudomonas aeruginosa NOT DETECTED NOT DETECTED Final   Candida albicans NOT DETECTED NOT DETECTED Final   Candida glabrata NOT DETECTED NOT DETECTED Final   Candida krusei NOT DETECTED NOT DETECTED Final   Candida parapsilosis NOT DETECTED NOT DETECTED Final   Candida tropicalis NOT DETECTED NOT DETECTED Final    Comment: Performed at [facility], [address] [place], [place] [address]     Labs: Basic Metabolic Panel: Recent Labs  Lab 03/17/18 1944 03/19/18 0517 03/20/18 0357 03/21/18 0443 03/22/18 0424  NA 137 135 139 140 141  K 3.7 3.8 3.3* 3.8 3.6  CL 105 108 109 113* 112*  CO2 22 21* 21* 21* 21*  GLUCOSE 132* 93 94 88 101*  BUN 18 20 12  6* 6*  CREATININE 0.73 0.73 0.63 0.57 0.56  CALCIUM 8.8* 7.5* 8.2* 8.1* 8.5*   Liver Function Tests: Recent Labs  Lab 03/17/18 1944 03/19/18 0517 03/20/18 0357 03/21/18 0443 03/22/18 0424   AST 54* 41 38 38 31  ALT 33 28 28 26 25   ALKPHOS 92 72 86 75 78  BILITOT 1.2 1.1 0.8 0.7 0.6  PROT 6.4* 5.3* 5.7* 5.2* 5.5*  ALBUMIN 3.7 2.9* 3.0* 2.8* 2.9*   CBC: Recent Labs  Lab 03/17/18 1944 03/19/18 0517 03/20/18 0357 03/21/18 0443 03/22/18 0424  WBC 12.3* 8.6 6.2 5.0 5.1  NEUTROABS 11.1*  --   --   --   --   HGB 14.0 11.4* 12.4 11.4* 11.7*  HCT 44.4 37.5 39.2 37.4 37.7  MCV 97.6 101.1* 99.2 101.4* 99.7  PLT 136* 90* 108* 109* 147*    IMAGING STUDIES Dg Chest 2 View  Result Date: 03/17/2018 CLINICAL DATA:  Cough, fever, body aches, and loss of appetite for 72 hours. EXAM: CHEST - 2 VIEW COMPARISON:  08/13/2015 FINDINGS: Cardiac enlargement. No vascular congestion, edema, or consolidation. No blunting of costophrenic angles. No pneumothorax. Mediastinal contours appear intact. Degenerative changes in the spine. No change since prior study. IMPRESSION: Cardiac enlargement. No evidence of active pulmonary disease. Electronically Signed   By: Burman Nieves M.D.   On: 03/17/2018 23:52   Nm Hepatobiliary Liver Func  Result Date: 03/22/2018 CLINICAL DATA:  Right upper quadrant abdominal pain EXAM: NUCLEAR MEDICINE HEPATOBILIARY IMAGING TECHNIQUE: Sequential images of the abdomen were obtained out to 60 minutes following intravenous administration of radiopharmaceutical. RADIOPHARMACEUTICALS:  5.4 mCi Tc-35m  Choletec IV COMPARISON:  Abdominal ultrasound 03/19/2018 FINDINGS: There is satisfactory uptake of radiopharmaceutical from the blood pool. Biliary activity is visible 11 minutes. Gallbladder activity is visible at 19 minutes and appears to proceed bowel activity significantly, although is less conspicuous on the later images. Bowel activity is visible at 30 minutes. There is extensive motion on the images as the patient was unable to remain stationary IMPRESSION: 1. Normal hepatobiliary scan, with patent cystic duct and patent common bile duct. Normal uptake of  radiopharmaceutical from the blood. 2. Reduced diagnostic sensitivity and specificity due to extensive motion artifact. Electronically Signed   By: Gaylyn Rong M.D.   On: 03/22/2018 13:11   Ct  Abdomen Pelvis W Contrast  Result Date: 03/19/2018 CLINICAL DATA:  Mid to low abdominal pain. EXAM: CT ABDOMEN AND PELVIS WITH CONTRAST TECHNIQUE: Multidetector CT imaging of the abdomen and pelvis was performed using the standard protocol following bolus administration of intravenous contrast. CONTRAST:  OMNIPAQUE IOHEXOL 300 MG/ML SOLN, 15mL OMNIPAQUE IOHEXOL 300 MG/ML SOLN COMPARISON:  05/03/2010 FINDINGS: Lower chest: Heart is mildly enlarged.  Small pericardial effusion. Small bilateral pleural effusions. There is dependent opacity in the lower lobes, which may all be atelectasis. A component pneumonia is possible particularly in the left lower lobe. There is also bilateral lower lobe bronchial wall thickening. Hepatobiliary: Liver is normal in size. There are small foci of increased enhancement that are likely transient hepatic attenuation differences, supported by resolution on the delayed images. Subcentimeter low-density lesion in the anterior left lobe consistent with a cyst and stable from prior CT. No other liver abnormalities. Gallbladder is mildly distended. There is increased enhancement of the mucosa with surrounding edema and/or wall thickening. Hazy edema extends along the gastrohepatic ligament. No visualized gallstones. Common bile duct measures 7 mm, normal for age. Pancreas: No mass or inflammation. Dilated pancreatic duct, maximum 4 mm, increased when compared to the prior CT. Spleen: Normal in size without focal abnormality. Adrenals/Urinary Tract: No adrenal masses. Mild bilateral renal cortical thinning. Low-density, homogeneous left mid to lower pole renal mass consistent with a cyst, measuring 2.4 cm. No renal stones. No hydronephrosis. Ureters are normal in course and in caliber. No  ureteral stones. Normal bladder. Stomach/Bowel: Stomach is mostly decompressed. There is increased enhancement of the mucosa of the duodenum. Hazy inflammatory type changes extend along the first and second portions of the duodenum contiguous with that surrounding the gallbladder. No duodenal wall thickening. Small bowel is normal in caliber. No wall thickening or inflammation. There is mild dilation of the right colon. No colonic wall thickening or inflammation. Vascular/Lymphatic: Aortic atherosclerosis. No enlarged abdominal or pelvic lymph nodes. Reproductive: Uterus and bilateral adnexa are unremarkable. Other: Trace amount of ascites is seen adjacent to the liver and in the posterior pelvic recess. Musculoskeletal: Mild compression deformity of L2, stable from the prior CT. No acute fracture. No osteoblastic or osteolytic lesions. IMPRESSION: 1. There are inflammatory changes centered around the gallbladder tracking along the gastrohepatic ligament and adjacent to the first and second portions of the duodenum. Findings may be due to acute cholecystitis or duodenitis. Findings could potentially be due to liver inflammation. There is no evidence of pancreatitis. 2. No other acute abnormality within the abdomen or pelvis. 3. Small pleural effusions, small pericardial effusion and lower lobe, lung base opacities. Lower lobe opacities are most likely atelectasis. Pneumonia is possible. There is bilateral lower lobe bronchial wall thickening, which could reflect acute or chronic inflammation. 4. Chronic findings including aortic atherosclerosis and a left renal cyst. Electronically Signed   By: Amie Portland M.D.   On: 03/19/2018 14:51   US Abdomen Limited Ruq  Result Date: 03/19/2018 CLINICAL DATA:  Inflammatory changes involving the gallbladder on an abdomen and pelvis CT earlier today. EXAM: ULTRASOUND ABDOMEN LIMITED RIGHT UPPER QUADRANT COMPARISON:  Abdomen and pelvis CT obtained earlier today. FINDINGS:  Gallbladder: Diffuse gallbladder wall thickening with a maximum thickness of 4.8 mm. Small amount of sludge in the gallbladder. No gallstones or pericholecystic fluid. There was a positive sonographic Murphy sign. Common bile duct: Diameter: 6.2 mm Liver: No focal lesion identified. The small stable probable cyst in the liver was not visualized sonographically.  Within normal limits in parenchymal echogenicity. Portal vein is patent on color Doppler imaging with normal direction of blood flow towards the liver. IMPRESSION: 1. Diffuse gallbladder wall thickening, small amount of sludge in the gallbladder and positive sonographic Murphy sign. This could be due to acute or chronic acalculous cholecystitis. Secondary inflammation due to hepatitis or duodenitis is less likely but not excluded based on the CT appearance. 2. Otherwise, unremarkable examination. Electronically Signed   By: Beckie Salts M.D.   On: 03/19/2018 21:45    DISCHARGE EXAMINATION: Vitals:   03/22/18 0608 03/22/18 0936 03/22/18 1335 03/22/18 2055  BP: (!) 142/81  (!) 156/79 (!) 157/77  Pulse: 60  (!) 52 71  Resp: 16  16 16   Temp: 98.4 F (36.9 C)  98.1 F (36.7 C) 99.2 F (37.3 C)  TempSrc:   Oral Oral  SpO2: 95% 96% 100% 98%  Weight:      Height:       General appearance: Awake alert.  In no distress. mildly distracted. Resp: Normal effort at rest.  Coarse breath sounds bilaterally. Cardio: S1-S2 is normal regular.  No S3-S4.  No rubs murmurs or bruit GI: Abdomen is soft.  Nontender nondistended.  Bowel sounds are present normal.  No masses organomegaly Extremities: No edema.  Full range of motion of lower extremities. Neurologic: No focal neurological deficits.   DISPOSITION: SNF  Discharge Instructions    Call MD for:  difficulty breathing, headache or visual disturbances   Complete by:  As directed    Call MD for:  extreme fatigue   Complete by:  As directed    Call MD for:  persistant dizziness or light-headedness    Complete by:  As directed    Call MD for:  persistant nausea and vomiting   Complete by:  As directed    Call MD for:  severe uncontrolled pain   Complete by:  As directed    Call MD for:  temperature >100.4   Complete by:  As directed    Diet - low sodium heart healthy   Complete by:  As directed    Discharge instructions   Complete by:  As directed    Please review instructions on the discharge summary.  You were cared for by a hospitalist during your hospital stay. If you have any questions about your discharge medications or the care you received while you were in the hospital after you are discharged, you can call the unit and asked to speak with the hospitalist on call if the hospitalist that took care of you is not available. Once you are discharged, your primary care physician will handle any further medical issues. Please note that NO REFILLS for any discharge medications will be authorized once you are discharged, as it is imperative that you return to your primary care physician (or establish a relationship with a primary care physician if you do not have one) for your aftercare needs so that they can reassess your need for medications and monitor your lab values. If you do not have a primary care physician, you can call [phone] for a physician referral.   Increase activity slowly   Complete by:  As directed        Allergies as of 03/23/2018      Reactions   Codeine Nausea And Vomiting      Medication List    STOP taking these medications   donepezil 10 MG tablet Commonly known as:  ARICEPT   propranolol  10 MG tablet Commonly known as:  INDERAL     TAKE these medications   amLODipine 5 MG tablet Commonly known as:  NORVASC Take 1 tablet (5 mg total) by mouth daily.   cephALEXin 500 MG capsule Commonly known as:  KEFLEX Take 1 capsule (500 mg total) by mouth every 8 (eight) hours for 10 days.   guaiFENesin 600 MG 12 hr tablet Commonly known as:  MUCINEX Take  1 tablet (600 mg total) by mouth 2 (two) times daily.   guaiFENesin-dextromethorphan 100-10 MG/5ML syrup Commonly known as:  ROBITUSSIN DM Take 5 mLs by mouth every 4 (four) hours as needed for cough.   ipratropium-albuterol 0.5-2.5 (3) MG/3ML Soln Commonly known as:  DUONEB Take 3 mLs by nebulization every 6 (six) hours as needed.   pantoprazole 40 MG tablet Commonly known as:  PROTONIX Take 1 tablet (40 mg total) by mouth daily at 12 noon.   polyethylene glycol packet Commonly known as:  MIRALAX / GLYCOLAX Take 17 g by mouth daily.   SUMAtriptan 50 MG tablet Commonly known as:  IMITREX Take 1 tablet at onset of migraine. Can take additional tablet in 2 hours if needed once. Do not take more than 2 tablets in 24 hours.   traZODone 100 MG tablet Commonly known as:  DESYREL Take 1 tablet (100 mg total) by mouth at bedtime. What changed:    medication strength  how much to take   venlafaxine XR 150 MG 24 hr capsule Commonly known as:  EFFEXOR-XR Take 150 mg by mouth daily.        Follow-up Information    Merri BrunetteSmith, Candace, MD. Schedule an appointment as soon as possible for a visit in 1 week(s).   Specialty:  Family Medicine Contact information: [phone] [place] [address] [phone]           TOTAL DISCHARGE TIME: 35 minutes  Makina Skow Rito EhrlichKrishnan  [facility] Pager on [email or web address]  03/23/2018, 12:31 PM

## 2018-03-24 MED ORDER — PANTOPRAZOLE SODIUM 40 MG PO TBEC: 40.0000 mg | DELAYED_RELEASE_TABLET | Freq: Every day | ORAL | 3 refills | Status: DC

## 2018-03-24 MED ORDER — GUAIFENESIN-DM 100-10 MG/5ML PO SYRP: 5.0000 mL | ORAL_SOLUTION | ORAL | 0 refills | Status: DC | PRN

## 2018-03-24 MED ORDER — CEPHALEXIN 500 MG PO CAPS: 500.0000 mg | ORAL_CAPSULE | Freq: Three times a day (TID) | ORAL | 0 refills | Status: AC

## 2018-03-24 MED ORDER — GUAIFENESIN ER 600 MG PO TB12: 600.0000 mg | ORAL_TABLET | Freq: Two times a day (BID) | ORAL | 0 refills | Status: AC

## 2018-03-24 MED ORDER — TRAZODONE HCL 100 MG PO TABS: 100.0000 mg | ORAL_TABLET | Freq: Every day | ORAL | Status: AC

## 2018-03-24 MED ORDER — AMLODIPINE BESYLATE 5 MG PO TABS: 5.0000 mg | ORAL_TABLET | Freq: Every day | ORAL | 3 refills | Status: DC

## 2018-03-24 NOTE — Progress Notes (Signed)
LCSW following for SNF placement.   Patient denied by [facility], due to pt walking 271ft.   LCSW notified patient and daughter, [name].   LCSW updated attending and RNCM.   Beulah Gandy [place] Long CSW [phone]

## 2018-03-24 NOTE — Progress Notes (Signed)
Discharge instructions reviewed with patient and daughter. Patient being discharged to home. [name] May daughter to follow up with Marcelle Smiling Case manager in am for [facility] PT

## 2018-03-24 NOTE — Progress Notes (Signed)
Physical Therapy Treatment Patient Details Name: Jeanne Lopez MRN: [id number] DOB: 01/31/1933 Today's Date: 03/24/2018    History of Present Illness pt was admitted with fever, congestion, cough.  Found to have e coli bacteremia.  PMH:  dementia, anxiety, depression    PT Comments    Patient progressing with gait/balance, but still fall risk.  Do not feel, however, pt would remember to use walker so did not recommend for home.  Educated on fall prevention, but daughter needs to re-inforce, so would benefit from HHPT at d/c as denied for SNF by insurance.  PT to follow if not d/c.    Follow Up Recommendations  Home health PT     Equipment Recommendations  None recommended by PT    Recommendations for Other Services       Precautions / Restrictions Precautions Precautions: Fall    Mobility  Bed Mobility Overal bed mobility: Independent                Transfers   Equipment used: None Transfers: Sit to/from Stand Sit to Stand: Supervision         General transfer comment: holding onto end of bed  Ambulation/Gait Ambulation/Gait assistance: Supervision;Min guard Gait Distance (Feet): 400 Feet Assistive device: None Gait Pattern/deviations: Step-through pattern;Decreased stride length;Drifts right/left     General Gait Details: mostly supervision to occasional minguard for safety, no overt LOB, occasional veering from straight path, wore her shoes with improved stability over sock feet she had wanted to walk in   Stairs             Wheelchair Mobility    Modified Rankin (Stroke Patients Only)       Balance Overall balance assessment: Needs assistance   Sitting balance-Leahy Scale: Good       Standing balance-Leahy Scale: Good                              Cognition Arousal/Alertness: Awake/alert Behavior During Therapy: WFL for tasks assessed/performed Overall Cognitive Status: No family/caregiver present to determine  baseline cognitive functioning                                 General Comments: h/o dementia, took several moments to recall her daughter lives with her, but works      Film/video editorxercises      General Comments General comments (skin integrity, edema, etc.): Educated in fall prevention including footwear, lighting, sitting several moments prior to standing, etc, but pt with dementia and daughter not available to educate      Pertinent Vitals/Pain Pain Assessment: No/denies pain    Home Living                      Prior Function            PT Goals (current goals can now be found in the care plan section) Progress towards PT goals: Progressing toward goals    Frequency    Min 3X/week      PT Plan Discharge plan needs to be updated    Co-evaluation              AM-PAC PT "6 Clicks" Mobility   Outcome Measure  Help needed turning from your back to your side while in a flat bed without using bedrails?: None Help needed moving from lying on your back  to sitting on the side of a flat bed without using bedrails?: None Help needed moving to and from a bed to a chair (including a wheelchair)?: A Little Help needed standing up from a chair using your arms (e.g., wheelchair or bedside chair)?: None Help needed to walk in hospital room?: A Little Help needed climbing 3-5 steps with a railing? : A Little 6 Click Score: 21    End of Session   Activity Tolerance: Patient tolerated treatment well Patient left: with call bell/phone within reach;in bed   PT Visit Diagnosis: Muscle weakness (generalized) (M62.81);Unsteadiness on feet (R26.81)     Time: 9323-5573 PT Time Calculation (min) (ACUTE ONLY): 11 min  Charges:  $Gait Training: 8-22 mins                     Sheran Lawless, [facility] [phone] 03/24/2018    Elray Mcgregor 03/24/2018, 5:01 PM

## 2018-03-24 NOTE — Progress Notes (Signed)
No specific new changes patient medically has stabilized since admission and is being discharged to a skilled facility Blood pressures medications may need to be adjusted in the outpatient setting

## 2018-03-24 NOTE — Progress Notes (Signed)
Reviewed discharge plan with patient/daughter Patient escorted off unit with daughter at 107 to home

## 2018-03-24 NOTE — Care Management Note (Signed)
Case Management Note  Patient Details  Name: Jeanne Lopez MRN: [id number] Date of Birth: [birth year]  Subjective/Objective:                  Discharge planning  Action/Plan: Spoke with patient concerning hhc at home, she lives with the daughter and is not sure if she needs any hhc/tct-susan the daughter/message left for her to return call at her convenice.  Expected Discharge Date:                  Expected Discharge Plan:     In-House Referral:     Discharge planning Services     Post Acute Care Choice:    Choice offered to:     DME Arranged:    DME Agency:     HH Arranged:    HH Agency:     Status of Service:     If discussed at [employer], dates discussed:    Additional Comments:  Golda Acre, RN 03/24/2018, 3:24 PM

## 2018-04-06 ENCOUNTER — Telehealth: Payer: Self-pay | Admitting: Neurology

## 2018-04-06 NOTE — Telephone Encounter (Signed)
Pts daughter [name] May on DPR called to inform us that the pt was in hospital and they had taken her off of two of her medications for her headaches but will be starting her up again per ED doctors orders. Just FYI

## 2018-04-07 ENCOUNTER — Telehealth: Payer: Self-pay | Admitting: Neurology

## 2018-04-07 MED ORDER — GABAPENTIN 100 MG PO CAPS: 100.0000 mg | ORAL_CAPSULE | Freq: Two times a day (BID) | ORAL | 3 refills | Status: DC

## 2018-04-07 NOTE — Telephone Encounter (Signed)
Events noted

## 2018-04-07 NOTE — Telephone Encounter (Signed)
I called the daughter.  The patient has been taken off of the propranolol, she was in the hospital with E. coli sepsis and had hypotension associated with this.  We will stop the propranolol, go to low-dose gabapentin, 100 mg 3 times daily.  They will call for any dose adjustments.  The patient is on amlodipine for blood pressure.

## 2018-04-07 NOTE — Telephone Encounter (Signed)
Pt daughter(on DPR) has called to inform that pt has been taken off what she believes was aricept and propanolol.  Daughter states within past week pt has had  4 headaches within the past week.  Daughter is asking if Dr Anne Hahn could pt pt on something that would not have anything to do with blood pressure issues since pt has never really had blood pressure issues.  Please call, daughter states they will keep current appointment but would like a call from RN once she has spoke with Dr Anne Hahn about another medication pt can take for headaches

## 2018-04-07 NOTE — Addendum Note (Signed)
Addended by: York Spaniel on: 04/07/2018 01:27 PM   Modules accepted: Orders

## 2018-04-16 ENCOUNTER — Other Ambulatory Visit: Payer: Self-pay | Admitting: Neurology

## 2018-05-05 ENCOUNTER — Other Ambulatory Visit: Payer: Self-pay | Admitting: Neurology

## 2018-05-07 ENCOUNTER — Other Ambulatory Visit: Payer: Self-pay | Admitting: Neurology

## 2018-05-10 ENCOUNTER — Other Ambulatory Visit: Payer: Self-pay | Admitting: Neurology

## 2018-06-21 ENCOUNTER — Telehealth: Payer: Self-pay | Admitting: Neurology

## 2018-06-21 NOTE — Telephone Encounter (Signed)
Pts daughter on DPR consented to a Virtual Visit and for the insurance to be billed as such. She has added a email to the chart and also gave this one if there is any problems on the one in the chart [email or web address]

## 2018-06-22 NOTE — Telephone Encounter (Signed)
A link has been e-mailed to [email or web address] with instructions on how to access her to [email or web address] visit with Margie Ege for 06/23/2018.

## 2018-06-23 ENCOUNTER — Encounter: Payer: Self-pay | Admitting: Neurology

## 2018-06-23 ENCOUNTER — Ambulatory Visit: Payer: Medicare Other | Admitting: Adult Health

## 2018-06-23 ENCOUNTER — Other Ambulatory Visit: Payer: Self-pay

## 2018-06-23 ENCOUNTER — Ambulatory Visit (INDEPENDENT_AMBULATORY_CARE_PROVIDER_SITE_OTHER): Payer: Medicare Other | Admitting: Neurology

## 2018-06-23 DIAGNOSIS — F0390 Unspecified dementia without behavioral disturbance: Secondary | ICD-10-CM | POA: Diagnosis not present

## 2018-06-23 DIAGNOSIS — G43009 Migraine without aura, not intractable, without status migrainosus: Secondary | ICD-10-CM | POA: Diagnosis not present

## 2018-06-23 MED ORDER — DONEPEZIL HCL 5 MG PO TABS: 5.0000 mg | ORAL_TABLET | Freq: Every day | ORAL | 1 refills | Status: DC

## 2018-06-23 MED ORDER — GABAPENTIN 100 MG PO CAPS: 100.0000 mg | ORAL_CAPSULE | Freq: Two times a day (BID) | ORAL | 1 refills | Status: DC

## 2018-06-23 NOTE — Progress Notes (Signed)
Virtual Visit via Video Note  I connected with Jeanne Lopez on 06/23/18 at  1:15 PM EDT by a video enabled telemedicine application and verified that I am speaking with the correct person using two identifiers.  Location: Patient: At her home Provider: At my home    I discussed the limitations of evaluation and management by telemedicine and the availability of in person appointments. The patient expressed understanding and agreed to proceed.  History of Present Illness: 06/23/2018 SS: Jeanne Lopez is an 85 year old female with history of migraine headaches and memory disorder.  Her propanolol for migraine headache has recently been discontinued due to a hospital admission in February 2020 for sepsis and hypotension, was started on gabapentin 100 mg twice daily.  She reports she has not had any headaches in a long time.  She says she has recovered from her hospitalization.  She currently lives with her daughter.  She does not drive.  She is able to perform her own ADLs.  She does not cook, but she reports she has a good appetite and has not lost any weight.  Her daughter manages her medications.  During the day she enjoys taking her small dog for walks down her street.  She does enjoy reading magazines and talking with friends on the phone.  Her daughter reports she feels her memory has declined in the last few months. This could be due to the recent quarantine or to stopping Aricept.  10/09/2017 Dr. Anne HahnWillis: Jeanne Lopez is an 85 year old right-handed white female with a history of migraine headaches and a memory disorder.  The patient is on Aricept currently, she comes in today with her daughter who believes that her cognitive status has been stable on the medication.  She takes 10 mg at night and tolerates the drug well.  She has gained 1 pound since last seen.  She sleeps well at night.  She is having on average about 1 headache a week, low-dose Imitrex seems to help significantly for the  headache.  The patient takes propranolol 20 mg twice daily, she tolerates the medication well.  She returns for an evaluation   Observations/Objective: Very pleasant, speech is clear and concise, answers questions appropriately, fair historian, facial symmetry noted, follows commands  Moca-blind was 9/18, able to name 9 words that start with letter F in 1 minute, oriented to year, place, city  Assessment and Plan: 1.  Headaches 2.  Memory disturbance  I had a very pleasant conversation with Jeanne Lopez and her daughter, [name].  She reports good benefit with her headaches, is not having any problem with headache.  She will continue taking gabapentin 100 mg twice daily.  I will send in a refill.  Appears that her memory score has declined, Moca-blind was 9/18.  At her last office visit her MMSE was 25/30.  Back in February 2020 she was taken off Aricept due to hospital admission for sepsis and hypotension.  Her daughter reports that over the last few months she has noticed a decline in her memory, she thinks this could be because she is off the Aricept.  Her blood pressure has been checked several times since her hospitalization and it has been doing well.  She will restart Aricept taking 5 mg at bedtime.  I will send in a refill. I suggested they purchase a BP monitoring machine.  We discussed trying to perform brain stimulating exercises such as reading, doing puzzles, playing games to promote memory.  She is  still active, she likes to walk her little dog down the street.  We discussed she can continue to do this however family will be involved to ensure she is safe and does not walk too far away.  In the future we may consider adding Namenda for memory.  Follow Up Instructions: I made her a follow-up appointment for 10/26/2018 2:15 pm  I discussed the assessment and treatment plan with the patient. The patient was provided an opportunity to ask questions and all were answered. The patient agreed  with the plan and demonstrated an understanding of the instructions.   The patient was advised to call back or seek an in-person evaluation if the symptoms worsen or if the condition fails to improve as anticipated.  I provided 30 minutes of non-face-to-face time during this encounter.  Otila Kluver, DNP  [facility] [address] [place], [place] [address] [phone]

## 2018-06-23 NOTE — Progress Notes (Signed)
I have read the note, and I agree with the clinical assessment and plan.  Charles K Willis   

## 2018-06-24 ENCOUNTER — Telehealth: Payer: Self-pay | Admitting: Neurology

## 2018-06-24 NOTE — Telephone Encounter (Signed)
I called her daughter, [name].  She reports she had some leftover Aricept 10 mg tablets, cut one in half and gave it to her mother last night.  This morning she woke up with a migraine, feeling dizzy, shaky, reports she had bad dreams.  Her daughter called the pharmacy and they report that the tablet was not scored, she may have received closer to 10 mg of Aricept.  Throughout the day she has continued to improve, seems to be feeling better.  She has eaten breakfast and lunch. They are wondering about taking the new prescription of 5 mg Aricept.  I advised them to not start the Aricept.  Let her recover from this, closely monitor.  Let us know if she does not improve. I do not think we should restart Aricept at this point.

## 2018-06-24 NOTE — Telephone Encounter (Signed)
Pt's daughter [name] on [place] called stating the she would like RN to call her back due to her mother having side effects since she started her donepezil (ARICEPT) 5 MG tablet. She got a  bad headache, dizziness, and was very shaky. She has been in bed almost all day because she has not been feeling well. Daughter took  bp 115/54  HR 56 Please advise.

## 2018-06-25 NOTE — Telephone Encounter (Signed)
Pt's daughter called back and stated that she may have over reacted yesterday and she would like to ask you more questions about her mothers  donepezil (ARICEPT) 5 MG tablet and she stated that the pt is fine now and may want to start her back on the  donepezil (ARICEPT) 5 MG tablet next week. Please advise.

## 2018-06-28 NOTE — Telephone Encounter (Signed)
Patient's daughter [name] May returning call about medication. Please call her at [phone]

## 2018-06-28 NOTE — Telephone Encounter (Signed)
I talked with her daughter, [name]. Her mother is feeling much better. She would like to try the new Aricept 5 mg at bedtime. She will call with any problems.

## 2018-06-28 NOTE — Telephone Encounter (Signed)
I called the patient and left a message. I asked them to call the office back to discuss. It should be fine for her mother, Ms. Gopal to restart the 5 mg aricept if she wishes. I had sent in the new rx last week.

## 2018-07-09 ENCOUNTER — Ambulatory Visit (INDEPENDENT_AMBULATORY_CARE_PROVIDER_SITE_OTHER): Payer: Medicare Other | Admitting: Internal Medicine

## 2018-07-09 ENCOUNTER — Encounter: Payer: Self-pay | Admitting: Internal Medicine

## 2018-07-09 ENCOUNTER — Other Ambulatory Visit: Payer: Self-pay

## 2018-07-09 ENCOUNTER — Other Ambulatory Visit: Payer: Self-pay | Admitting: Neurology

## 2018-07-09 DIAGNOSIS — M810 Age-related osteoporosis without current pathological fracture: Secondary | ICD-10-CM | POA: Diagnosis not present

## 2018-07-09 NOTE — Progress Notes (Signed)
Patient ID: Jeanne Lopez, female   DOB: 1932/02/23, 85 y.o.   MRN: [id number]  Patient location: Home My location: Office  Referring Provider: Merri Brunette, MD  I connected with the patient and her daughter on 07/09/18 at 11:09 AM EDT by a video enabled telemedicine application and verified that I am speaking with the correct person.   I discussed the limitations of evaluation and management by telemedicine and the availability of in person appointments. The patient expressed understanding and agreed to proceed.   Details of the encounter are shown below.  HPI  Jeanne Lopez is a 85 y.o.-year-old female, initially referred by Dr. Severiano Lopez, presenting for follow-up for osteoporosis. Last visit 1 year ago.  She was admitted earlier in the year with PNA and UTI.  No falls or fractures since last visit.  Reviewed and addended history: Pt was dx with OP in her 59s.  No fxs, but had a vb fx found on VFA analysis in 01/2013: moderate wedge, L2.  Reviewing the chart, she also had a right foot fracture in 12/11/2000.  She also has dizziness/vertigo (Meniere's ds.) -Controlled.  Reviewed patient's latest DXA scan, including the one from 2017, which showed improvement: Date L1-L4 (L2)T score RFN T score LFN T score 33% distal Radius  07/16/2015 [facility], [place])  -2.8(+6.8%*)  -2.4 -2.9 -3.9  01/26/2013 ([facility], [place]) -3.2 -2.5 -3.0 -4.4  08/07/2008 ([facility]) L1-L4: -3.0 -3.0 (however, the femur not properly rotated, ROI not properly defined) -2.8 (however, the femur not properly rotated, ROI not properly defined)  n/a  12/16/2000 L1-L4: -3.7 Total Hip: -3.4    12/05/1998 L1-L4: -3.8 Total Hip: -3.3    05/18/1997 L1-L4: -3.7 Total Hip: -3.3     Reviewed previous and current osteoporosis treatment:  - estrogen 1976-1986 - on Fosamax 1986-2003 - on Boniva 2004-2015 -On Prolia: 09/15/2013, 05/30/2014, 11/2014, 06/11/2015, 04/17/2016, 11/12/2016, 06/30/2017,  01/19/2018  No history of hyperparathyroidism.  No history of hyperparathyroidism.  No history of kidney stones.  She has had some low calcium readings from when she was in the hospital in 03/2018, most likely due to IV fluids: Lab Results  Component Value Date   PTH 25.9 08/11/2013   CALCIUM 8.5 (L) 03/22/2018   CALCIUM 8.1 (L) 03/21/2018   CALCIUM 8.2 (L) 03/20/2018   CALCIUM 7.5 (L) 03/19/2018   CALCIUM 8.8 (L) 03/17/2018   CALCIUM 10.1 07/08/2017   CALCIUM 9.7 07/08/2016   CALCIUM 8.7 (L) 08/13/2015   CALCIUM 9.6 06/11/2015   CALCIUM 9.8 07/12/2014   No history of thyrotoxicosis: Lab Results  Component Value Date   TSH 3.874 03/20/2018   Latest vitamin D level was reviewed and this was normal: Lab Results  Component Value Date   VD25OH 48.05 07/08/2017   VD25OH 39.30 07/08/2016   VD25OH 48.51 06/11/2015   VD25OH 34.91 06/09/2014   VD25OH 57.99 08/11/2013   She continues on vitamin D 1000 units daily.  She eats plenty of green leafy vegetables, and not much dairy.  No CKD. Last BUN/Cr: Lab Results  Component Value Date   BUN 6 (L) 03/22/2018   CREATININE 0.56 03/22/2018   She is walking with her dog for exercise.  ROS: Constitutional: no weight gain/no weight loss, no fatigue, no subjective hyperthermia, no subjective hypothermia Eyes: no blurry vision, no xerophthalmia ENT: no sore throat, no nodules palpated in neck, no dysphagia, no odynophagia, no hoarseness Cardiovascular: no CP/no SOB/no palpitations/no leg swelling Respiratory: no cough/no SOB/no wheezing Gastrointestinal: no  N/no V/no D/no C/no acid reflux Musculoskeletal: no muscle aches/+ joint aches (back pain) Skin: no rashes, no hair loss Neurological: no tremors/no numbness/no tingling/no dizziness  I reviewed pt's medications, allergies, PMH, social hx, family hx, and changes were documented in the history of present illness. Otherwise, unchanged from my initial visit note.  Past Medical  History:  Diagnosis Date  . Anxiety   . Depression   . Heart murmur   . Insomnia   . Memory disorder 04/02/2017  . Meniere's disease   . Migraine   . Osteoporosis    Past Surgical History:  Procedure Laterality Date  . APPENDECTOMY    . BREAST LUMPECTOMY Right   . DILATION AND CURETTAGE OF UTERUS     History   Social History  . Marital Status: Widowed    Spouse Name: N/A    Number of Children: 3   Social History Main Topics  . Smoking status: Never Smoker   . Smokeless tobacco: Not on file  . Alcohol Use: No  . Drug Use: No   Social History Narrative   Lives with daughter and dog. Grandchildren in frequently to help her.    Retired.    Current Outpatient Medications on File Prior to Visit  Medication Sig Dispense Refill  . amLODipine (NORVASC) 5 MG tablet Take 1 tablet (5 mg total) by mouth daily. 30 tablet 3  . donepezil (ARICEPT) 5 MG tablet Take 1 tablet (5 mg total) by mouth at bedtime. 90 tablet 1  . gabapentin (NEURONTIN) 100 MG capsule Take 1 capsule (100 mg total) by mouth 2 (two) times daily. 180 capsule 1  . pantoprazole (PROTONIX) 40 MG tablet Take 1 tablet (40 mg total) by mouth daily at 12 noon. (Patient not taking: Reported on 06/23/2018) 30 tablet 3  . polyethylene glycol (MIRALAX / GLYCOLAX) packet Take 17 g by mouth daily.    . SUMAtriptan (IMITREX) 50 MG tablet TAKE 1 TABLET AT ONSET, MAY REPEAT IN 2 HOURS IF MIGRAINE NOT RELIEVED. MAX OF 2 IN 24 HOURS. (Patient not taking: Reported on 06/23/2018) 10 tablet 0  . traZODone (DESYREL) 100 MG tablet Take 1 tablet (100 mg total) by mouth at bedtime.    Marland Kitchen venlafaxine XR (EFFEXOR-XR) 150 MG 24 hr capsule Take 150 mg by mouth daily.     No current facility-administered medications on file prior to visit.    Allergies  Allergen Reactions  . Codeine Nausea And Vomiting   FH: see HPI + - HTN: in sister, mother - CVA: in Jeanne Lopez  PE: There were no vitals taken for this visit. There is no height or weight on file  to calculate BMI. Wt Readings from Last 3 Encounters:  03/24/18 118 lb 2.7 oz (53.6 kg)  10/09/17 112 lb 8 oz (51 kg)  07/08/17 110 lb (49.9 kg)   Constitutional:  in NAD  The physical exam was not performed (virtual visit).  Assessment: 1. Osteoporosis - 1 incidental vb fx: moderate wedge - L2 >> healed - s/p 20 years of Bisphosphonates - no SEs - on Boniva previously, now on Prolia   Plan: 1. Osteoporosis -Likely age-related and postmenopausal -Prolia was started 09/2013 -She is tolerating Prolia well, without side effects.  She denies hip pain, thigh pain, jaw pain.  At last visit, she had dental work in progress, but this is now complete. -She had 8 Prolia injections so far.  We discussed at last visit that they should not be spaced more than 6 months  apart since this will increase her risk of fractures and deteriorating BMD. -I reviewed the latest DEXA scan result together with patient and her daughter: In 2017, this showed improvement, most likely due to Prolia injections -She is aware of fall precaution measures -Reviewed latest vitamin D level which was normal at last visit -Continue 1000 units vitamin D daily - I suggested osteo-strong skeletal loading in the past, but she did not do this -She is due for the next Prolia injection next month.  We will check the following tests when she returns to the office  Vitamin D  BMP -I discussed with patient and her daughter that the plan is to continue Prolia for at least 6 years, but possibly for 10 years. -I advised the patient to discuss with Dr. Katrinka BlazingSmith to order another bone density scan [facility].  I explained that it is best to obtain this on the same machine as previous. -I will see her back in a year.  - time spent with the patient and her daughter: 15 minutes, of which >50% was spent in obtaining information about her symptoms, reviewing her previous labs, evaluations, and treatments, counseling her about her  condition (please see the discussed topics above), and developing a plan to further investigate and treat it; they had a number of questions which I addressed.  Carlus Pavlovristina Nyema Hachey, MD PhD [facility]

## 2018-07-09 NOTE — Patient Instructions (Signed)
Please come back for a Prolia injection in June.  At that time, please stop at the lab.  Please ask Dr. Katrinka Blazing to order a bone density scan in her office and send the results to me.  Please return to see me in 1 year.

## 2018-10-25 NOTE — Progress Notes (Deleted)
PATIENT: Jeanne Lopez DOB: 04/03/1932  REASON FOR VISIT: follow up HISTORY FROM: patient  HISTORY OF PRESENT ILLNESS: Today 10/25/18  HISTORY  06/23/2018 SS: Jeanne Lopez is an 85 year old female with history of migraine headaches and memory disorder.  Her propanolol for migraine headache has recently been discontinued due to a hospital admission in February 2020 for sepsis and hypotension, was started on gabapentin 100 mg twice daily.  She reports she has not had any headaches in a long time.  She says she has recovered from her hospitalization.  She currently lives with her daughter.  She does not drive.  She is able to perform her own ADLs.  She does not cook, but she reports she has a good appetite and has not lost any weight.  Her daughter manages her medications.  During the day she enjoys taking her small dog for walks down her street.  She does enjoy reading magazines and talking with friends on the phone.  Her daughter reports she feels her memory has declined in the last few months. This could be due to the recent quarantine or to stopping Aricept.  10/09/2017 Dr. Anne HahnWillis: Jeanne Lopez an 85 year old right-handed white female with a history of migraine headaches and a memory disorder. The patient is on Aricept currently, she comes in today with her daughter who believes that her cognitive status has been stable on the medication. She takes 10 mg at night and tolerates the drug well. She has gained 1 pound since last seen. She sleeps well at night. She is having on average about 1 headache a week, low-dose Imitrex seems to help significantly for the headache. The patient takes propranolol 20 mg twice daily, she tolerates the medication well. She returns for an evaluation  REVIEW OF SYSTEMS: Out of a complete 14 system review of symptoms, the patient complains only of the following symptoms, and all other reviewed systems are negative.  ALLERGIES: Allergies  Allergen  Reactions  . Codeine Nausea And Vomiting    HOME MEDICATIONS: Outpatient Medications Prior to Visit  Medication Sig Dispense Refill  . amLODipine (NORVASC) 5 MG tablet Take 1 tablet (5 mg total) by mouth daily. 30 tablet 3  . donepezil (ARICEPT) 5 MG tablet Take 1 tablet (5 mg total) by mouth at bedtime. 90 tablet 1  . gabapentin (NEURONTIN) 100 MG capsule Take 1 capsule (100 mg total) by mouth 2 (two) times daily. 180 capsule 1  . pantoprazole (PROTONIX) 40 MG tablet Take 1 tablet (40 mg total) by mouth daily at 12 noon. (Patient not taking: Reported on 06/23/2018) 30 tablet 3  . polyethylene glycol (MIRALAX / GLYCOLAX) packet Take 17 g by mouth daily.    . SUMAtriptan (IMITREX) 50 MG tablet TAKE 1 TABLET AT ONSET, MAY REPEAT IN 2 HOURS IF MIGRAINE NOT RELIEVED. MAX OF 2 IN 24 HOURS. 10 tablet 0  . traZODone (DESYREL) 100 MG tablet Take 1 tablet (100 mg total) by mouth at bedtime.    Marland Kitchen. venlafaxine XR (EFFEXOR-XR) 150 MG 24 hr capsule Take 150 mg by mouth daily.     No facility-administered medications prior to visit.     PAST MEDICAL HISTORY: Past Medical History:  Diagnosis Date  . Anxiety   . Depression   . Heart murmur   . Insomnia   . Memory disorder 04/02/2017  . Meniere's disease   . Migraine   . Osteoporosis     PAST SURGICAL HISTORY: Past Surgical History:  Procedure Laterality Date  .  APPENDECTOMY    . BREAST LUMPECTOMY Right   . DILATION AND CURETTAGE OF UTERUS      FAMILY HISTORY: Family History  Problem Relation Age of Onset  . Hypertension Brother   . Cancer Sister   . Hypertension Sister   . Hypertension Mother   . Heart attack Mother   . Hypertension Father   . Stroke Father   . Alcohol abuse Daughter   . Depression Daughter   . Drug abuse Daughter   . Alcohol abuse Son   . Depression Son   . Drug abuse Son   . CVA Maternal Grandmother   . Kidney disease Neg Hx   . Hyperlipidemia Neg Hx   . Heart disease Neg Hx   . Early death Neg Hx   .  Arthritis Neg Hx     SOCIAL HISTORY: Social History   Socioeconomic History  . Marital status: Widowed    Spouse name: Not on file  . Number of children: 3  . Years of education: College  . Highest education level: Not on file  Occupational History  . Occupation: Retired  Scientific laboratory technician  . Financial resource strain: Not hard at all  . Food insecurity    Worry: Patient refused    Inability: Patient refused  . Transportation needs    Medical: Patient refused    Non-medical: Patient refused  Tobacco Use  . Smoking status: Never Smoker  . Smokeless tobacco: Never Used  Substance and Sexual Activity  . Alcohol use: No  . Drug use: No  . Sexual activity: Not Currently  Lifestyle  . Physical activity    Days per week: 7 days    Minutes per session: 30 min  . Stress: Not at all  Relationships  . Social connections    Talks on phone: More than three times a week    Gets together: Twice a week    Attends religious service: More than 4 times per year    Active member of club or organization: No    Attends meetings of clubs or organizations: Never    Relationship status: Widowed  . Intimate partner violence    Fear of current or ex partner: Patient refused    Emotionally abused: Patient refused    Physically abused: Patient refused    Forced sexual activity: Patient refused  Other Topics Concern  . Not on file  Social History Narrative   Lives with daughter and dog.    Grandchildren in frequently to help her.    Retired.    Right handed    Caffeine use: Coffee every morning, 1 glass tea daily      PHYSICAL EXAM  There were no vitals filed for this visit. There is no height or weight on file to calculate BMI.  Generalized: Well developed, in no acute distress   Neurological examination  Mentation: Alert oriented to time, place, history taking. Follows all commands speech and language fluent Cranial nerve II-XII: Pupils were equal round reactive to light.  Extraocular movements were full, visual field were full on confrontational test. Facial sensation and strength were normal. Uvula tongue midline. Head turning and shoulder shrug  were normal and symmetric. Motor: The motor testing reveals 5 over 5 strength of all 4 extremities. Good symmetric motor tone is noted throughout.  Sensory: Sensory testing is intact to soft touch on all 4 extremities. No evidence of extinction is noted.  Coordination: Cerebellar testing reveals good finger-nose-finger and heel-to-shin bilaterally.  Gait and  station: Gait is normal. Tandem gait is normal. Romberg is negative. No drift is seen.  Reflexes: Deep tendon reflexes are symmetric and normal bilaterally.   DIAGNOSTIC DATA (LABS, IMAGING, TESTING) - I reviewed patient records, labs, notes, testing and imaging myself where available.  Lab Results  Component Value Date   WBC 5.1 03/22/2018   HGB 11.7 (L) 03/22/2018   HCT 37.7 03/22/2018   MCV 99.7 03/22/2018   PLT 147 (L) 03/22/2018      Component Value Date/Time   NA 141 03/22/2018 0424   K 3.6 03/22/2018 0424   CL 112 (H) 03/22/2018 0424   CO2 21 (L) 03/22/2018 0424   GLUCOSE 101 (H) 03/22/2018 0424   BUN 6 (L) 03/22/2018 0424   CREATININE 0.56 03/22/2018 0424   CREATININE 0.77 07/08/2017 1125   CALCIUM 8.5 (L) 03/22/2018 0424   PROT 5.5 (L) 03/22/2018 0424   ALBUMIN 2.9 (L) 03/22/2018 0424   AST 31 03/22/2018 0424   ALT 25 03/22/2018 0424   ALKPHOS 78 03/22/2018 0424   BILITOT 0.6 03/22/2018 0424   GFRNONAA >60 03/22/2018 0424   GFRNONAA 72 07/08/2017 1125   GFRAA >60 03/22/2018 0424   GFRAA 83 07/08/2017 1125   Lab Results  Component Value Date   CHOL 242 (H) 07/12/2014   HDL 78.80 07/12/2014   LDLCALC 145 (H) 07/12/2014   TRIG 93.0 07/12/2014   CHOLHDL 3 07/12/2014   No results found for: HGBA1C Lab Results  Component Value Date   VITAMINB12 874 08/16/2013   Lab Results  Component Value Date   TSH 3.874 03/20/2018       ASSESSMENT AND PLAN 85 y.o. year old female  has a past medical history of Anxiety, Depression, Heart murmur, Insomnia, Memory disorder (04/02/2017), Meniere's disease, Migraine, and Osteoporosis. here with ***   I spent 15 minutes with the patient. 50% of this time was spent   Margie Ege, [place], DNP 10/25/2018, 2:11 PM [facility] [address], [address] [place], [place] [address] [phone]

## 2018-10-26 ENCOUNTER — Encounter: Payer: Self-pay | Admitting: Neurology

## 2018-10-26 ENCOUNTER — Ambulatory Visit: Payer: Medicare Other | Admitting: Neurology

## 2018-11-21 NOTE — Progress Notes (Signed)
Virtual Visit via Video Note  I connected with Courney A Hillenburg on 11/22/18 at  1:45 PM EDT by a video enabled telemedicine application and verified that I am speaking with the correct person using two identifiers.  Location: Patient: At her home Provider: In the office   I discussed the limitations of evaluation and management by telemedicine and the availability of in person appointments. The patient expressed understanding and agreed to proceed.  History of Present Illness: 11/22/2018 SS: Ms. Jacome is an 85 year old female with history of migraine headaches and memory disorder.  She remains on Aricept 5 mg at bedtime.  Previously she was taking Aricept 10 mg, but it was discontinued in February 2020 after hospital admission for sepsis and hypotension.  She was also taking propanolol for migraine headaches, but this was discontinued at that time too.  She remains on gabapentin 100 mg twice a day for headaches.  She indicates her headaches are under good control, she only has about 1/month.  She is prescribed low-dose Imitrex.  She says she rarely has to take this, but historically it is been the only thing to be helpful.  She feels her memory is stable.  She lives with her daughter.  She does not drive a car.  She is able to perform her own ADLs.  She indicates she has a good appetite.  She has not had any falls.  Her daughter manages her medications.  During the day, she enjoys walking her small dog, reading, and watching TV.  She presents today for follow-up via virtual visit accompanied by her daughter.  06/23/2018 SS: Ms. Nicole is an 85 year old female with history of migraine headaches and memory disorder.  Her propanolol for migraine headache has recently been discontinued due to a hospital admission in February 2020 for sepsis and hypotension, was started on gabapentin 100 mg twice daily.  She reports she has not had any headaches in a long time.  She says she has recovered from her  hospitalization.  She currently lives with her daughter.  She does not drive.  She is able to perform her own ADLs.  She does not cook, but she reports she has a good appetite and has not lost any weight.  Her daughter manages her medications.  During the day she enjoys taking her small dog for walks down her street.  She does enjoy reading magazines and talking with friends on the phone.  Her daughter reports she feels her memory has declined in the last few months. This could be due to the recent quarantine or to stopping Aricept.  10/09/2017 Dr. Jannifer Franklin: Ms.Tunstallis an 85 year old right-handed white female with a history of migraine headaches and a memory disorder. The patient is on Aricept currently, she comes in today with her daughter who believes that her cognitive status has been stable on the medication. She takes 10 mg at night and tolerates the drug well. She has gained 1 pound since last seen. She sleeps well at night. She is having on average about 1 headache a week, low-dose Imitrex seems to help significantly for the headache. The patient takes propranolol 20 mg twice daily, she tolerates the medication well. 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  Migraine headaches 2.  Memory disturbance  Overall, it appears she has continued to do quite well.  Her memory appears stable, Moca-blind was slightly improved.  We will increase Aricept to 10 mg at bedtime.  She was previously on this dose and tolerated it well.  We discussed side effects, and her daughter will keep an eye on her blood pressure.  She was in the hospital in February for sepsis and hypotension, as result propanolol and  Aricept were discontinued.  Her headaches continue to be under good control.  She will continue taking gabapentin 100 mg twice a day, Imitrex as needed.  She is aware of the risk of taking triptan medication given her age, but it is been the only thing that has shown to be helpful.  She will follow-up in 6 months or sooner if needed.  Follow Up Instructions: 05/25/2019 2:15   I discussed the assessment and treatment plan with the patient. The patient was provided an opportunity to ask questions and all were answered. The patient agreed with the plan and demonstrated an understanding of the instructions.   The patient was advised to call back or seek an in-person evaluation if the symptoms worsen or if the condition fails to improve as anticipated.  I provided 15 minutes of non-face-to-face time during this encounter.  Otila Kluver, DNP  [facility] [address] [place], [place] [address] [phone]

## 2018-11-22 ENCOUNTER — Telehealth (INDEPENDENT_AMBULATORY_CARE_PROVIDER_SITE_OTHER): Payer: Medicare Other | Admitting: Neurology

## 2018-11-22 ENCOUNTER — Encounter: Payer: Self-pay | Admitting: Neurology

## 2018-11-22 DIAGNOSIS — R413 Other amnesia: Secondary | ICD-10-CM

## 2018-11-22 DIAGNOSIS — G43009 Migraine without aura, not intractable, without status migrainosus: Secondary | ICD-10-CM | POA: Diagnosis not present

## 2018-11-22 MED ORDER — DONEPEZIL HCL 10 MG PO TABS: 10.0000 mg | ORAL_TABLET | Freq: Every day | ORAL | 1 refills | Status: DC

## 2018-11-22 MED ORDER — SUMATRIPTAN SUCCINATE 50 MG PO TABS: ORAL_TABLET | ORAL | 0 refills | Status: DC

## 2018-11-22 MED ORDER — GABAPENTIN 100 MG PO CAPS: 100.0000 mg | ORAL_CAPSULE | Freq: Two times a day (BID) | ORAL | 1 refills | Status: DC

## 2018-11-22 NOTE — Progress Notes (Signed)
I have read the note, and I agree with the clinical assessment and plan.  Charles K Willis   

## 2019-02-22 ENCOUNTER — Other Ambulatory Visit: Payer: Self-pay | Admitting: Neurology

## 2019-03-19 ENCOUNTER — Ambulatory Visit: Payer: Medicare Other

## 2019-03-21 ENCOUNTER — Ambulatory Visit: Payer: Medicare PPO | Attending: Internal Medicine

## 2019-03-21 DIAGNOSIS — Z23 Encounter for immunization: Secondary | ICD-10-CM | POA: Insufficient documentation

## 2019-03-21 NOTE — Progress Notes (Signed)
   Covid-19 Vaccination Clinic  Name:  RENNE CORNICK    MRN: [id number] DOB: 13-Feb-1932  03/21/2019  [name] was observed post Covid-19 immunization for 15 minutes without incidence. She was provided with Vaccine Information Sheet and instruction to access the V-Safe system.   [name] was instructed to call 911 with any severe reactions post vaccine: Marland Kitchen Difficulty breathing  . Swelling of your face and throat  . A fast heartbeat  . A bad rash all over your body  . Dizziness and weakness    Immunizations Administered    Name Date Dose VIS Date Route   Pfizer COVID-19 Vaccine 03/21/2019  1:29 PM 0.3 mL 01/21/2019 Intramuscular   Manufacturer: [employer], [employer]   Lot: [id number]   NDC: [address]-2

## 2019-03-26 ENCOUNTER — Ambulatory Visit: Payer: Self-pay

## 2019-04-04 ENCOUNTER — Ambulatory Visit: Payer: Medicare Other

## 2019-04-14 ENCOUNTER — Ambulatory Visit: Payer: Medicare PPO | Attending: Internal Medicine

## 2019-04-14 DIAGNOSIS — Z23 Encounter for immunization: Secondary | ICD-10-CM | POA: Insufficient documentation

## 2019-04-14 NOTE — Progress Notes (Signed)
   Covid-19 Vaccination Clinic  Name:  Jeanne Lopez    MRN: [id number] DOB: July 27, 1932  04/14/2019  [name] was observed post Covid-19 immunization for 15 minutes without incident. She was provided with Vaccine Information Sheet and instruction to access the V-Safe system.   [name] was instructed to call 911 with any severe reactions post vaccine: Marland Kitchen Difficulty breathing  . Swelling of face and throat  . A fast heartbeat  . A bad rash all over body  . Dizziness and weakness   Immunizations Administered    Name Date Dose VIS Date Route   Pfizer COVID-19 Vaccine 04/14/2019  1:50 PM 0.3 mL 01/21/2019 Intramuscular   Manufacturer: [employer], [employer]   Lot: [id number]   NDC: [address]-8

## 2019-05-25 ENCOUNTER — Other Ambulatory Visit: Payer: Self-pay

## 2019-05-25 ENCOUNTER — Other Ambulatory Visit: Payer: Self-pay | Admitting: Neurology

## 2019-05-25 ENCOUNTER — Encounter: Payer: Self-pay | Admitting: Neurology

## 2019-05-25 ENCOUNTER — Ambulatory Visit: Payer: Medicare Other | Admitting: Neurology

## 2019-05-25 VITALS — BP 104/55 | HR 53 | Temp 97.8°F | Ht 62.0 in | Wt 118.4 lb

## 2019-05-25 DIAGNOSIS — R413 Other amnesia: Secondary | ICD-10-CM | POA: Diagnosis not present

## 2019-05-25 DIAGNOSIS — G43009 Migraine without aura, not intractable, without status migrainosus: Secondary | ICD-10-CM

## 2019-05-25 MED ORDER — SUMATRIPTAN SUCCINATE 50 MG PO TABS: ORAL_TABLET | ORAL | 1 refills | Status: DC

## 2019-05-25 MED ORDER — MEMANTINE HCL 5 MG PO TABS: ORAL_TABLET | ORAL | 0 refills | Status: DC

## 2019-05-25 MED ORDER — GABAPENTIN 100 MG PO CAPS: 100.0000 mg | ORAL_CAPSULE | Freq: Two times a day (BID) | ORAL | 1 refills | Status: DC

## 2019-05-25 MED ORDER — DONEPEZIL HCL 10 MG PO TABS: 10.0000 mg | ORAL_TABLET | Freq: Every day | ORAL | 1 refills | Status: DC

## 2019-05-25 NOTE — Patient Instructions (Addendum)
Continue current medications Start Namenda for memory 5 mg tablet, slow increase to 10 mg twice daily   Take 1 tablet daily for one week, then take 1 tablet twice daily for one week, then take 1 tablet in the morning and 2 in the evening for one week, then take 2 tablets twice daily  Once finish titration I will send in the 10 mg tablets, send me a my chart message   See you back in 6 months   Memantine Tablets What is this medicine? MEMANTINE (MEM an teen) is used to treat dementia caused by Alzheimer's disease. This medicine may be used for other purposes; ask your health care provider or pharmacist if you have questions. COMMON BRAND NAME(S): Namenda What should I tell my health care provider before I take this medicine? They need to know if you have any of these conditions:  difficulty passing urine  kidney disease  liver disease  seizures  an unusual or allergic reaction to memantine, other medicines, foods, dyes, or preservatives  pregnant or trying to get pregnant  breast-feeding How should I use this medicine? Take this medicine by mouth with a glass of water. Follow the directions on the prescription label. You may take this medicine with or without food. Take your doses at regular intervals. Do not take your medicine more often than directed. Continue to take your medicine even if you feel better. Do not stop taking except on the advice of your doctor or health care professional. Talk to your pediatrician regarding the use of this medicine in children. Special care may be needed. Overdosage: If you think you have taken too much of this medicine contact a poison control center or emergency room at once. NOTE: This medicine is only for you. Do not share this medicine with others. What if I miss a dose? If you miss a dose, take it as soon as you can. If it is almost time for your next dose, take only that dose. Do not take double or extra doses. If you do not take your  medicine for several days, contact your health care provider. Your dose may need to be changed. What may interact with this medicine?  acetazolamide  amantadine  cimetidine  dextromethorphan  dofetilide  hydrochlorothiazide  ketamine  metformin  methazolamide  quinidine  ranitidine  sodium bicarbonate  triamterene This list may not describe all possible interactions. Give your health care provider a list of all the medicines, herbs, non-prescription drugs, or dietary supplements you use. Also tell them if you smoke, drink alcohol, or use illegal drugs. Some items may interact with your medicine. What should I watch for while using this medicine? Visit your doctor or health care professional for regular checks on your progress. Check with your doctor or health care professional if there is no improvement in your symptoms or if they get worse. You may get drowsy or dizzy. Do not drive, use machinery, or do anything that needs mental alertness until you know how this drug affects you. Do not stand or sit up quickly, especially if you are an older patient. This reduces the risk of dizzy or fainting spells. Alcohol can make you more drowsy and dizzy. Avoid alcoholic drinks. What side effects may I notice from receiving this medicine? Side effects that you should report to your doctor or health care professional as soon as possible:  allergic reactions like skin rash, itching or hives, swelling of the face, lips, or tongue  agitation or a  feeling of restlessness  depressed mood  dizziness  hallucinations  redness, blistering, peeling or loosening of the skin, including inside the mouth  seizures  vomiting Side effects that usually do not require medical attention (report to your doctor or health care professional if they continue or are bothersome):  constipation  diarrhea  headache  nausea  trouble sleeping This list may not describe all possible side effects.  Call your doctor for medical advice about side effects. You may report side effects to FDA at 1-800-FDA-1088. Where should I keep my medicine? Keep out of the reach of children. Store at room temperature between 15 degrees and 30 degrees C (59 degrees and 86 degrees F). Throw away any unused medicine after the expiration date. NOTE: This sheet is a summary. It may not cover all possible information. If you have questions about this medicine, talk to your doctor, pharmacist, or health care provider.  2020 Elsevier/Gold Standard (2012-11-15 14:10:42)

## 2019-05-25 NOTE — Progress Notes (Signed)
I have read the note, and I agree with the clinical assessment and plan.  Nari Vannatter K Aleksis Jiggetts   

## 2019-05-25 NOTE — Progress Notes (Signed)
PATIENT: Jeanne Lopez DOB: 10-06-1932  REASON FOR VISIT: follow up HISTORY FROM: patient  HISTORY OF PRESENT ILLNESS: Today 05/25/19  Jeanne Lopez is an 86 year old female with history of migraine headaches and memory disorder.  She takes low-dose gabapentin and Imitrex for headaches.  She takes Aricept 10 mg for memory.  Overall, feels memory is relatively stable, she is forgetful, may misplace things, have repetitive questioning.  She lives with her daughter.  She does not drive.  She enjoys watching TV, walking her dog.  She is able to perform her ADLs.  She denies falls.  Her headaches are under excellent control, rarely has a headache.  Has not had to take Imitrex in a long time.  She is pleased to be able to get out more now that she is vaccinated.  She denies new problems or concerns.  She presents today for follow-up accompanied by her daughter.  HISTORY  11/22/2018 SS: Jeanne Lopez is an 86 year old female with history of migraine headaches and memory disorder.  She remains on Aricept 5 mg at bedtime.  Previously she was taking Aricept 10 mg, but it was discontinued in February 2020 after hospital admission for sepsis and hypotension.  She was also taking propanolol for migraine headaches, but this was discontinued at that time too.  She remains on gabapentin 100 mg twice a day for headaches.  She indicates her headaches are under good control, she only has about 1/month.  She is prescribed low-dose Imitrex.  She says she rarely has to take this, but historically it is been the only thing to be helpful.  She feels her memory is stable.  She lives with her daughter.  She does not drive a car.  She is able to perform her own ADLs.  She indicates she has a good appetite.  She has not had any falls.  Her daughter manages her medications.  During the day, she enjoys walking her small dog, reading, and watching TV.  She presents today for follow-up via virtual visit accompanied by her  daughter.  REVIEW OF SYSTEMS: Out of a complete 14 system review of symptoms, the patient complains only of the following symptoms, and all other reviewed systems are negative.  Headache, memory loss  ALLERGIES: Allergies  Allergen Reactions  . Codeine Nausea And Vomiting    HOME MEDICATIONS: Outpatient Medications Prior to Visit  Medication Sig Dispense Refill  . amLODipine (NORVASC) 5 MG tablet Take 1 tablet (5 mg total) by mouth daily. 30 tablet 3  . donepezil (ARICEPT) 10 MG tablet Take 1 tablet (10 mg total) by mouth at bedtime. 90 tablet 1  . gabapentin (NEURONTIN) 100 MG capsule Take 1 capsule (100 mg total) by mouth 2 (two) times daily. 180 capsule 1  . polyethylene glycol (MIRALAX / GLYCOLAX) packet Take 17 g by mouth daily.    . SUMAtriptan (IMITREX) 50 MG tablet TAKE 1 TABLET AT ONSET, MAY REPEAT IN 2 HOURS IF MIGRAINE NOT RELIEVED. MAX OF 2 IN 24 HOURS. 10 tablet 0  . traZODone (DESYREL) 100 MG tablet Take 1 tablet (100 mg total) by mouth at bedtime.    Marland Kitchen venlafaxine XR (EFFEXOR-XR) 150 MG 24 hr capsule Take 150 mg by mouth daily.    . pantoprazole (PROTONIX) 40 MG tablet Take 1 tablet (40 mg total) by mouth daily at 12 noon. (Patient not taking: Reported on 06/23/2018) 30 tablet 3   No facility-administered medications prior to visit.    PAST MEDICAL HISTORY:  Past Medical History:  Diagnosis Date  . Anxiety   . Depression   . Heart murmur   . Insomnia   . Memory disorder 04/02/2017  . Meniere's disease   . Migraine   . Osteoporosis     PAST SURGICAL HISTORY: Past Surgical History:  Procedure Laterality Date  . APPENDECTOMY    . BREAST LUMPECTOMY Right   . DILATION AND CURETTAGE OF UTERUS      FAMILY HISTORY: Family History  Problem Relation Age of Onset  . Hypertension Brother   . Cancer Sister   . Hypertension Sister   . Hypertension Mother   . Heart attack Mother   . Hypertension Father   . Stroke Father   . Alcohol abuse Daughter   . Depression  Daughter   . Drug abuse Daughter   . Alcohol abuse Son   . Depression Son   . Drug abuse Son   . CVA Maternal Grandmother   . Kidney disease Neg Hx   . Hyperlipidemia Neg Hx   . Heart disease Neg Hx   . Early death Neg Hx   . Arthritis Neg Hx     SOCIAL HISTORY: Social History   Socioeconomic History  . Marital status: Widowed    Spouse name: Not on file  . Number of children: 3  . Years of education: College  . Highest education level: Not on file  Occupational History  . Occupation: Retired  Tobacco Use  . Smoking status: Never Smoker  . Smokeless tobacco: Never Used  Substance and Sexual Activity  . Alcohol use: No  . Drug use: No  . Sexual activity: Not Currently  Other Topics Concern  . Not on file  Social History Narrative   Lives with daughter and dog.    Grandchildren in frequently to help her.    Retired.    Right handed    Caffeine use: Coffee every morning, 1 glass tea daily   Social Determinants of Health   Financial Resource Strain:   . Difficulty of Paying Living Expenses:   Food Insecurity:   . Worried About Charity fundraiser in the Last Year:   . Arboriculturist in the Last Year:   Transportation Needs:   . Film/video editor (Medical):   Marland Kitchen Lack of Transportation (Non-Medical):   Physical Activity:   . Days of Exercise per Week:   . Minutes of Exercise per Session:   Stress:   . Feeling of Stress :   Social Connections:   . Frequency of Communication with Friends and Family:   . Frequency of Social Gatherings with Friends and Family:   . Attends Religious Services:   . Active Member of Clubs or Organizations:   . Attends Archivist Meetings:   Marland Kitchen Marital Status:   Intimate Partner Violence:   . Fear of Current or Ex-Partner:   . Emotionally Abused:   Marland Kitchen Physically Abused:   . Sexually Abused:       PHYSICAL EXAM  Vitals:   05/25/19 1415  Temp: 97.8 F (36.6 C)  Weight: 118 lb 6.4 oz (53.7 kg)  Height: 5\' 2"   (1.575 m)   Body mass index is 21.66 kg/m.  MMSE - Mini Mental State Exam 05/25/2019 10/09/2017 04/02/2017  Orientation to time 1 3 1   Orientation to Place 4 4 3   Registration 3 3 3   Attention/ Calculation 2 5 0  Recall 2 2 1   Language- name 2 objects 2 2  2  Language- repeat 1 0 1  Language- follow 3 step command 3 3 3   Language- read & follow direction 1 1 1   Write a sentence 1 1 1   Copy design 0 1 0  Total score 20 25 16     Neurological examination  Mentation: Alert oriented to time, place, history is mostly provided by her daughter. Follows all commands speech and language fluent.  Patient very pleasant respiratory Cranial nerve II-XII: Pupils were equal round reactive to light. Extraocular movements were full, visual field were full on confrontational test. Facial sensation and strength were normal.  Head turning and shoulder shrug  were normal and symmetric. Motor: The motor testing reveals 5 over 5 strength of all 4 extremities. Good symmetric motor tone is noted throughout.  Sensory: Sensory testing is intact to soft touch on all 4 extremities. No evidence of extinction is noted.  Coordination: Cerebellar testing reveals good finger-nose-finger and heel-to-shin bilaterally.  Gait and station: Gait is slightly wide based, cautions, no assistive device. Tandem gait is unsteady. Reflexes: Deep tendon reflexes are symmetric and normal bilaterally.   DIAGNOSTIC DATA (LABS, IMAGING, TESTING) - I reviewed patient records, labs, notes, testing and imaging myself where available.  Lab Results  Component Value Date   WBC 5.1 03/22/2018   HGB 11.7 (L) 03/22/2018   HCT 37.7 03/22/2018   MCV 99.7 03/22/2018   PLT 147 (L) 03/22/2018      Component Value Date/Time   NA 141 03/22/2018 0424   K 3.6 03/22/2018 0424   CL 112 (H) 03/22/2018 0424   CO2 21 (L) 03/22/2018 0424   GLUCOSE 101 (H) 03/22/2018 0424   BUN 6 (L) 03/22/2018 0424   CREATININE 0.56 03/22/2018 0424   CREATININE  0.77 07/08/2017 1125   CALCIUM 8.5 (L) 03/22/2018 0424   PROT 5.5 (L) 03/22/2018 0424   ALBUMIN 2.9 (L) 03/22/2018 0424   AST 31 03/22/2018 0424   ALT 25 03/22/2018 0424   ALKPHOS 78 03/22/2018 0424   BILITOT 0.6 03/22/2018 0424   GFRNONAA >60 03/22/2018 0424   GFRNONAA 72 07/08/2017 1125   GFRAA >60 03/22/2018 0424   GFRAA 83 07/08/2017 1125   Lab Results  Component Value Date   CHOL 242 (H) 07/12/2014   HDL 78.80 07/12/2014   LDLCALC 145 (H) 07/12/2014   TRIG 93.0 07/12/2014   CHOLHDL 3 07/12/2014   No results found for: HGBA1C Lab Results  Component Value Date   VITAMINB12 874 08/16/2013   Lab Results  Component Value Date   TSH 3.874 03/20/2018      ASSESSMENT AND PLAN 86 y.o. year old female  has a past medical history of Anxiety, Depression, Heart murmur, Insomnia, Memory disorder (04/02/2017), Meniere's disease, Migraine, and Osteoporosis. here with:  1.  Migraine headaches 2.  Memory disturbance, MMSE 20/30  Overall, her condition remains relatively stable, and headaches are under good control.  She will remain on Aricept, gabapentin, and Imitrex.  I will start Namenda slow titration with 5 mg tablet to 10 mg twice a day.  She will follow-up in 6 months or sooner if needed.  I spent 30 minutes of face-to-face and non-face-to-face time with patient.  This included previsit chart review, lab review, study review, order entry, electronic health record documentation, patient education.  10/17/2013, AGNP-C, DNP 05/25/2019, 2:26 PM [facility] [address] [place], Margie Ege 05/27/2019 [phone]

## 2019-05-25 NOTE — Telephone Encounter (Signed)
Pt has an appt today with Maralyn Sago, NP.

## 2019-07-15 ENCOUNTER — Other Ambulatory Visit: Payer: Self-pay | Admitting: Neurology

## 2019-07-18 ENCOUNTER — Telehealth: Payer: Self-pay | Admitting: *Deleted

## 2019-07-18 MED ORDER — MEMANTINE HCL 10 MG PO TABS: 10.0000 mg | ORAL_TABLET | Freq: Two times a day (BID) | ORAL | 5 refills | Status: DC

## 2019-07-18 NOTE — Telephone Encounter (Signed)
Daughter [name] called (on [place]). Requesting refill for memantine sent to [facility]. Pt just ran out. Taking 2 tabs of 5mg  (10mg  total) po BID. E-scribed 10mg  po BID to pharmacy. Advised her to call back if she runs into any more issues.

## 2019-09-19 DIAGNOSIS — M810 Age-related osteoporosis without current pathological fracture: Secondary | ICD-10-CM | POA: Diagnosis not present

## 2019-09-19 DIAGNOSIS — G4700 Insomnia, unspecified: Secondary | ICD-10-CM | POA: Diagnosis not present

## 2019-09-19 DIAGNOSIS — I129 Hypertensive chronic kidney disease with stage 1 through stage 4 chronic kidney disease, or unspecified chronic kidney disease: Secondary | ICD-10-CM | POA: Diagnosis not present

## 2019-09-19 DIAGNOSIS — N183 Chronic kidney disease, stage 3 unspecified: Secondary | ICD-10-CM | POA: Diagnosis not present

## 2019-09-19 DIAGNOSIS — R413 Other amnesia: Secondary | ICD-10-CM | POA: Diagnosis not present

## 2019-09-19 DIAGNOSIS — L989 Disorder of the skin and subcutaneous tissue, unspecified: Secondary | ICD-10-CM | POA: Diagnosis not present

## 2019-09-19 DIAGNOSIS — F419 Anxiety disorder, unspecified: Secondary | ICD-10-CM | POA: Diagnosis not present

## 2019-09-27 ENCOUNTER — Telehealth: Payer: Self-pay | Admitting: Internal Medicine

## 2019-09-27 NOTE — Telephone Encounter (Signed)
Patient's daughter [name] May called to schedule the patients Prolia shot.  Phone number for return call is [phone].  

## 2019-09-27 NOTE — Telephone Encounter (Signed)
Patient has not been verified as of this time. It is unclear how, but patient did not receive her last injection. Patient has been submitted for verificatoin via the [employer] Assist Portal.

## 2019-10-03 DIAGNOSIS — L738 Other specified follicular disorders: Secondary | ICD-10-CM | POA: Diagnosis not present

## 2019-10-03 DIAGNOSIS — D1801 Hemangioma of skin and subcutaneous tissue: Secondary | ICD-10-CM | POA: Diagnosis not present

## 2019-10-03 DIAGNOSIS — L237 Allergic contact dermatitis due to plants, except food: Secondary | ICD-10-CM | POA: Diagnosis not present

## 2019-10-03 DIAGNOSIS — C44319 Basal cell carcinoma of skin of other parts of face: Secondary | ICD-10-CM | POA: Diagnosis not present

## 2019-10-03 DIAGNOSIS — Z85828 Personal history of other malignant neoplasm of skin: Secondary | ICD-10-CM | POA: Diagnosis not present

## 2019-10-03 DIAGNOSIS — L570 Actinic keratosis: Secondary | ICD-10-CM | POA: Diagnosis not present

## 2019-10-03 DIAGNOSIS — L814 Other melanin hyperpigmentation: Secondary | ICD-10-CM | POA: Diagnosis not present

## 2019-10-03 DIAGNOSIS — D485 Neoplasm of uncertain behavior of skin: Secondary | ICD-10-CM | POA: Diagnosis not present

## 2019-10-03 DIAGNOSIS — L821 Other seborrheic keratosis: Secondary | ICD-10-CM | POA: Diagnosis not present

## 2019-10-03 DIAGNOSIS — C44311 Basal cell carcinoma of skin of nose: Secondary | ICD-10-CM | POA: Diagnosis not present

## 2019-10-05 ENCOUNTER — Encounter (HOSPITAL_COMMUNITY): Payer: Self-pay | Admitting: Emergency Medicine

## 2019-10-05 ENCOUNTER — Emergency Department (HOSPITAL_COMMUNITY): Payer: Medicare PPO

## 2019-10-05 ENCOUNTER — Inpatient Hospital Stay (HOSPITAL_COMMUNITY)
Admission: EM | Admit: 2019-10-05 | Discharge: 2019-10-11 | DRG: 481 | Disposition: A | Discharge status: Home / Self Care | Payer: Medicare PPO | Attending: Internal Medicine | Admitting: Internal Medicine

## 2019-10-05 DIAGNOSIS — Y92017 Garden or yard in single-family (private) house as the place of occurrence of the external cause: Secondary | ICD-10-CM

## 2019-10-05 DIAGNOSIS — D72829 Elevated white blood cell count, unspecified: Secondary | ICD-10-CM | POA: Diagnosis present

## 2019-10-05 DIAGNOSIS — Z8249 Family history of ischemic heart disease and other diseases of the circulatory system: Secondary | ICD-10-CM | POA: Diagnosis not present

## 2019-10-05 DIAGNOSIS — F418 Other specified anxiety disorders: Secondary | ICD-10-CM | POA: Diagnosis present

## 2019-10-05 DIAGNOSIS — M21851 Other specified acquired deformities of right thigh: Secondary | ICD-10-CM | POA: Diagnosis not present

## 2019-10-05 DIAGNOSIS — F0390 Unspecified dementia without behavioral disturbance: Secondary | ICD-10-CM | POA: Diagnosis not present

## 2019-10-05 DIAGNOSIS — Y93H2 Activity, gardening and landscaping: Secondary | ICD-10-CM | POA: Diagnosis not present

## 2019-10-05 DIAGNOSIS — I422 Other hypertrophic cardiomyopathy: Secondary | ICD-10-CM | POA: Diagnosis not present

## 2019-10-05 DIAGNOSIS — Z743 Need for continuous supervision: Secondary | ICD-10-CM | POA: Diagnosis not present

## 2019-10-05 DIAGNOSIS — I959 Hypotension, unspecified: Secondary | ICD-10-CM | POA: Diagnosis not present

## 2019-10-05 DIAGNOSIS — W1809XA Striking against other object with subsequent fall, initial encounter: Secondary | ICD-10-CM | POA: Diagnosis present

## 2019-10-05 DIAGNOSIS — Y998 Other external cause status: Secondary | ICD-10-CM | POA: Diagnosis not present

## 2019-10-05 DIAGNOSIS — S7291XS Unspecified fracture of right femur, sequela: Secondary | ICD-10-CM | POA: Diagnosis not present

## 2019-10-05 DIAGNOSIS — N179 Acute kidney failure, unspecified: Secondary | ICD-10-CM | POA: Diagnosis not present

## 2019-10-05 DIAGNOSIS — Z79899 Other long term (current) drug therapy: Secondary | ICD-10-CM

## 2019-10-05 DIAGNOSIS — H8109 Meniere's disease, unspecified ear: Secondary | ICD-10-CM | POA: Diagnosis present

## 2019-10-05 DIAGNOSIS — S7291XA Unspecified fracture of right femur, initial encounter for closed fracture: Secondary | ICD-10-CM

## 2019-10-05 DIAGNOSIS — Z818 Family history of other mental and behavioral disorders: Secondary | ICD-10-CM | POA: Diagnosis not present

## 2019-10-05 DIAGNOSIS — E785 Hyperlipidemia, unspecified: Secondary | ICD-10-CM | POA: Diagnosis not present

## 2019-10-05 DIAGNOSIS — S72301D Unspecified fracture of shaft of right femur, subsequent encounter for closed fracture with routine healing: Secondary | ICD-10-CM | POA: Diagnosis not present

## 2019-10-05 DIAGNOSIS — D696 Thrombocytopenia, unspecified: Secondary | ICD-10-CM | POA: Diagnosis not present

## 2019-10-05 DIAGNOSIS — R1312 Dysphagia, oropharyngeal phase: Secondary | ICD-10-CM | POA: Diagnosis not present

## 2019-10-05 DIAGNOSIS — Z20822 Contact with and (suspected) exposure to covid-19: Secondary | ICD-10-CM | POA: Diagnosis present

## 2019-10-05 DIAGNOSIS — Z419 Encounter for procedure for purposes other than remedying health state, unspecified: Secondary | ICD-10-CM

## 2019-10-05 DIAGNOSIS — I517 Cardiomegaly: Secondary | ICD-10-CM | POA: Diagnosis not present

## 2019-10-05 DIAGNOSIS — W19XXXA Unspecified fall, initial encounter: Secondary | ICD-10-CM | POA: Diagnosis not present

## 2019-10-05 DIAGNOSIS — M6281 Muscle weakness (generalized): Secondary | ICD-10-CM | POA: Diagnosis not present

## 2019-10-05 DIAGNOSIS — R400 Somnolence: Secondary | ICD-10-CM | POA: Diagnosis not present

## 2019-10-05 DIAGNOSIS — Z823 Family history of stroke: Secondary | ICD-10-CM | POA: Diagnosis not present

## 2019-10-05 DIAGNOSIS — D649 Anemia, unspecified: Secondary | ICD-10-CM

## 2019-10-05 DIAGNOSIS — M80051A Age-related osteoporosis with current pathological fracture, right femur, initial encounter for fracture: Secondary | ICD-10-CM | POA: Diagnosis present

## 2019-10-05 DIAGNOSIS — R9431 Abnormal electrocardiogram [ECG] [EKG]: Secondary | ICD-10-CM | POA: Diagnosis not present

## 2019-10-05 DIAGNOSIS — R262 Difficulty in walking, not elsewhere classified: Secondary | ICD-10-CM | POA: Diagnosis not present

## 2019-10-05 DIAGNOSIS — Z9181 History of falling: Secondary | ICD-10-CM | POA: Diagnosis not present

## 2019-10-05 DIAGNOSIS — D62 Acute posthemorrhagic anemia: Secondary | ICD-10-CM | POA: Diagnosis not present

## 2019-10-05 DIAGNOSIS — M8000XA Age-related osteoporosis with current pathological fracture, unspecified site, initial encounter for fracture: Secondary | ICD-10-CM | POA: Diagnosis not present

## 2019-10-05 DIAGNOSIS — S72341A Displaced spiral fracture of shaft of right femur, initial encounter for closed fracture: Secondary | ICD-10-CM | POA: Diagnosis not present

## 2019-10-05 DIAGNOSIS — W1830XA Fall on same level, unspecified, initial encounter: Secondary | ICD-10-CM

## 2019-10-05 DIAGNOSIS — T148XXA Other injury of unspecified body region, initial encounter: Secondary | ICD-10-CM | POA: Diagnosis present

## 2019-10-05 DIAGNOSIS — R41841 Cognitive communication deficit: Secondary | ICD-10-CM | POA: Diagnosis not present

## 2019-10-05 DIAGNOSIS — R52 Pain, unspecified: Secondary | ICD-10-CM | POA: Diagnosis not present

## 2019-10-05 DIAGNOSIS — S72301A Unspecified fracture of shaft of right femur, initial encounter for closed fracture: Secondary | ICD-10-CM | POA: Diagnosis not present

## 2019-10-05 DIAGNOSIS — S72001A Fracture of unspecified part of neck of right femur, initial encounter for closed fracture: Secondary | ICD-10-CM | POA: Diagnosis not present

## 2019-10-05 DIAGNOSIS — Z01818 Encounter for other preprocedural examination: Secondary | ICD-10-CM | POA: Diagnosis not present

## 2019-10-05 DIAGNOSIS — Z7401 Bed confinement status: Secondary | ICD-10-CM | POA: Diagnosis not present

## 2019-10-05 DIAGNOSIS — I499 Cardiac arrhythmia, unspecified: Secondary | ICD-10-CM | POA: Diagnosis not present

## 2019-10-05 DIAGNOSIS — I491 Atrial premature depolarization: Secondary | ICD-10-CM | POA: Diagnosis not present

## 2019-10-05 DIAGNOSIS — S79929A Unspecified injury of unspecified thigh, initial encounter: Secondary | ICD-10-CM | POA: Diagnosis not present

## 2019-10-05 DIAGNOSIS — I10 Essential (primary) hypertension: Secondary | ICD-10-CM | POA: Diagnosis not present

## 2019-10-05 DIAGNOSIS — R410 Disorientation, unspecified: Secondary | ICD-10-CM | POA: Diagnosis not present

## 2019-10-05 DIAGNOSIS — J90 Pleural effusion, not elsewhere classified: Secondary | ICD-10-CM | POA: Diagnosis not present

## 2019-10-05 DIAGNOSIS — M2550 Pain in unspecified joint: Secondary | ICD-10-CM | POA: Diagnosis not present

## 2019-10-05 LAB — CBC WITH DIFFERENTIAL/PLATELET
Abs Immature Granulocytes: 0.07 10*3/uL (ref 0.00–0.07)
Basophils Absolute: 0.1 10*3/uL (ref 0.0–0.1)
Basophils Relative: 0 %
Eosinophils Absolute: 0.1 10*3/uL (ref 0.0–0.5)
Eosinophils Relative: 0 %
HCT: 42.1 % (ref 36.0–46.0)
Hemoglobin: 13.1 g/dL (ref 12.0–15.0)
Immature Granulocytes: 1 %
Lymphocytes Relative: 6 %
Lymphs Abs: 0.8 10*3/uL (ref 0.7–4.0)
MCH: 30.3 pg (ref 26.0–34.0)
MCHC: 31.1 g/dL (ref 30.0–36.0)
MCV: 97.5 fL (ref 80.0–100.0)
Monocytes Absolute: 1 10*3/uL (ref 0.1–1.0)
Monocytes Relative: 7 %
Neutro Abs: 12.3 10*3/uL — ABNORMAL HIGH (ref 1.7–7.7)
Neutrophils Relative %: 86 %
Platelets: 186 10*3/uL (ref 150–400)
RBC: 4.32 MIL/uL (ref 3.87–5.11)
RDW: 14.9 % (ref 11.5–15.5)
WBC: 14.2 10*3/uL — ABNORMAL HIGH (ref 4.0–10.5)
nRBC: 0 % (ref 0.0–0.2)

## 2019-10-05 LAB — BASIC METABOLIC PANEL
Anion gap: 11 (ref 5–15)
BUN: 19 mg/dL (ref 8–23)
CO2: 24 mmol/L (ref 22–32)
Calcium: 9.3 mg/dL (ref 8.9–10.3)
Chloride: 105 mmol/L (ref 98–111)
Creatinine, Ser: 1.05 mg/dL — ABNORMAL HIGH (ref 0.44–1.00)
GFR calc Af Amer: 56 mL/min — ABNORMAL LOW (ref >60)
GFR calc non Af Amer: 49 mL/min — ABNORMAL LOW (ref >60)
Glucose, Bld: 107 mg/dL — ABNORMAL HIGH (ref 70–99)
Potassium: 4.3 mmol/L (ref 3.5–5.1)
Sodium: 140 mmol/L (ref 135–145)

## 2019-10-05 LAB — PROTIME-INR
INR: 1 (ref 0.8–1.2)
Prothrombin Time: 12.5 seconds (ref 11.4–15.2)

## 2019-10-05 LAB — SARS CORONAVIRUS 2 BY RT PCR (HOSPITAL ORDER, PERFORMED IN ~~LOC~~ HOSPITAL LAB): SARS Coronavirus 2: NEGATIVE

## 2019-10-05 MED ORDER — MORPHINE SULFATE (PF) 2 MG/ML IV SOLN
2.0000 mg | Freq: Once | INTRAVENOUS | Status: AC
  Administered 2019-10-05: 2 mg via INTRAVENOUS
  Filled 2019-10-05: qty 1

## 2019-10-05 MED ORDER — MORPHINE SULFATE (PF) 2 MG/ML IV SOLN
0.5000 mg | INTRAVENOUS | Status: DC | PRN
  Administered 2019-10-05: 0.5 mg via INTRAVENOUS
  Filled 2019-10-05: qty 1

## 2019-10-05 MED ORDER — ONDANSETRON HCL 4 MG/2ML IJ SOLN
4.0000 mg | Freq: Once | INTRAMUSCULAR | Status: AC
  Administered 2019-10-05: 4 mg via INTRAVENOUS
  Filled 2019-10-05: qty 2

## 2019-10-05 MED ORDER — ONDANSETRON HCL 4 MG/2ML IJ SOLN: 4.0000 mg | Freq: Four times a day (QID) | INTRAMUSCULAR | Status: DC | PRN

## 2019-10-05 MED ORDER — HYDROCODONE-ACETAMINOPHEN 5-325 MG PO TABS: 1.0000 | ORAL_TABLET | Freq: Four times a day (QID) | ORAL | Status: DC | PRN

## 2019-10-05 MED ORDER — SENNOSIDES-DOCUSATE SODIUM 8.6-50 MG PO TABS: 1.0000 | ORAL_TABLET | Freq: Every evening | ORAL | Status: DC | PRN

## 2019-10-05 MED ORDER — SODIUM CHLORIDE 0.9 % IV SOLN: INTRAVENOUS | Status: DC

## 2019-10-05 MED ORDER — MORPHINE SULFATE (PF) 4 MG/ML IV SOLN
4.0000 mg | INTRAVENOUS | Status: DC | PRN
  Administered 2019-10-05: 4 mg via INTRAVENOUS
  Filled 2019-10-05: qty 1

## 2019-10-05 NOTE — Anesthesia Preprocedure Evaluation (Addendum)
Anesthesia Evaluation  Patient identified by MRN, date of birth, ID band Patient awake    Reviewed: Allergy & Precautions, NPO status , Patient's Chart, lab work & pertinent test results  Airway Mallampati: III  TM Distance: >3 FB Neck ROM: Full    Dental  (+) Partial Upper, Dental Advisory Given   Pulmonary neg pulmonary ROS,    Pulmonary exam normal breath sounds clear to auscultation       Cardiovascular hypertension, Pt. on medications  Rhythm:Regular Rate:Normal + Systolic murmurs HLD  TTE 2016 - Compared to the prior echo in 09/2008, there is now severe wall thickening and dynamic LVOT obstuction consistent with global variant hypertrophic cardiomyopathy. Peak LVOT gradient was 29 mmHg with valsalva. There is moderate TR, RVSP 37 mmHg. A small pericardial effusion persists.    Neuro/Psych  Headaches, PSYCHIATRIC DISORDERS Anxiety Depression Dementia    GI/Hepatic negative GI ROS, Neg liver ROS,   Endo/Other  negative endocrine ROS  Renal/GU negative Renal ROS  negative genitourinary   Musculoskeletal negative musculoskeletal ROS (+)   Abdominal   Peds  Hematology negative hematology ROS (+)   Anesthesia Other Findings   Reproductive/Obstetrics                            Anesthesia Physical Anesthesia Plan  ASA: III  Anesthesia Plan: General   Post-op Pain Management:    Induction: Intravenous  PONV Risk Score and Plan: 3 and Dexamethasone, Ondansetron and Treatment may vary due to age or medical condition  Airway Management Planned: Oral ETT  Additional Equipment: Arterial line  Intra-op Plan:   Post-operative Plan: Extubation in OR  Informed Consent: I have reviewed the patients History and Physical, chart, labs and discussed the procedure including the risks, benefits and alternatives for the proposed anesthesia with the patient or authorized representative who has  indicated his/her understanding and acceptance.     Dental advisory given  Plan Discussed with: CRNA  Anesthesia Plan Comments:       Anesthesia Quick Evaluation

## 2019-10-05 NOTE — Progress Notes (Signed)
Orthopedic Tech Progress Note Patient Details:  Jeanne Lopez 04/30/1932 [id number]  Musculoskeletal Traction Type of Traction: Bucks Skin Traction Traction Location: RLE Traction Weight: 10 lbs   Post Interventions Patient Tolerated: Fair Instructions Provided: Care of device   [name] 10/05/2019, 7:55 PM

## 2019-10-05 NOTE — Progress Notes (Signed)
Ortho Note  Case discussed and imaging reviewed with Rhea Bleacher, PA-C. 86 yo ground level fall with right femoral shaft. Recommend bucks traction to right leg. NPO after midnight. Plan for OR tomorrow AM. Formal consult to follow later tonight or tomorrow AM.  Roby Lofts, MD [facility] [phone] (office) [email or web address]

## 2019-10-05 NOTE — ED Notes (Signed)
Patient transported to X-ray 

## 2019-10-05 NOTE — ED Provider Notes (Signed)
[facility] EMERGENCY DEPARTMENT Provider Note   CSN: [id number] Arrival date & time: 10/05/19  1545     History No chief complaint on file.   Jeanne Lopez is a 86 y.o. female.  Patient with h/o heart murmur, hypertrophic cardiomyopathy (echo 2016), osteoporosis -- presents after a fall occurring today.  It was reported that the patient tripped over and onto a cinder block in her yard while gardening.  History confirmed by patient's daughter [name], who attended to her just after falling.  Her daughter was with her at home.  Patient was found on her back with her right leg bent at an awkward angle.  EMS was called.  Patient was on the ground for approximately 20 minutes.  EMS was concerned for femur fracture given location of pain.  Hare traction splint was applied.  Patient transported the hospital after receiving 250 mcg of fentanyl for pain, with improvement.  Patient denies hitting her head or losing consciousness.  She does not have a history of atrial fibrillation or history of anticoagulation.  She denies chest or abdominal pain.  No shortness of breath.  No numbness or tingling in her right foot reported.  Onset of symptoms acute.  Course is constant.  Movement makes the pain worse. She has received 2 covid vaccines.         Past Medical History:  Diagnosis Date  . Anxiety   . Depression   . Heart murmur   . Insomnia   . Memory disorder 04/02/2017  . Meniere's disease   . Migraine   . Osteoporosis     Patient Active Problem List   Diagnosis Date Noted  . Sepsis (HCC) 03/19/2018  . Febrile illness 03/18/2018  . Memory disorder 04/02/2017  . Chronic constipation 08/11/2014  . Hyperlipidemia with target LDL less than 130 07/12/2014  . Migraine without aura and without status migrainosus, not intractable 07/12/2014  . Other constipation 06/06/2014  . Systolic murmur 04/03/2014  . Dementia arising in the senium and presenium (HCC) 08/16/2013  .  Depression with anxiety 08/16/2013  . Osteoporosis 08/11/2013    Past Surgical History:  Procedure Laterality Date  . APPENDECTOMY    . BREAST LUMPECTOMY Right   . DILATION AND CURETTAGE OF UTERUS       OB History   No obstetric history on file.     Family History  Problem Relation Age of Onset  . Hypertension Brother   . Cancer Sister   . Hypertension Sister   . Hypertension Mother   . Heart attack Mother   . Hypertension Father   . Stroke Father   . Alcohol abuse Daughter   . Depression Daughter   . Drug abuse Daughter   . Alcohol abuse Son   . Depression Son   . Drug abuse Son   . CVA Maternal Grandmother   . Kidney disease Neg Hx   . Hyperlipidemia Neg Hx   . Heart disease Neg Hx   . Early death Neg Hx   . Arthritis Neg Hx     Social History   Tobacco Use  . Smoking status: Never Smoker  . Smokeless tobacco: Never Used  Vaping Use  . Vaping Use: Never used  Substance Use Topics  . Alcohol use: No  . Drug use: No    Home Medications Prior to Admission medications   Medication Sig Start Date End Date Taking? Authorizing Provider  amLODipine (NORVASC) 5 MG tablet Take 1 tablet (5 mg  total) by mouth daily. 03/24/18   Rhetta Mura, MD  donepezil (ARICEPT) 10 MG tablet Take 1 tablet (10 mg total) by mouth at bedtime. 05/25/19   Glean Salvo, NP  gabapentin (NEURONTIN) 100 MG capsule Take 1 capsule (100 mg total) by mouth 2 (two) times daily. 05/25/19   Glean Salvo, NP  memantine (NAMENDA) 10 MG tablet Take 1 tablet (10 mg total) by mouth 2 (two) times daily. 07/18/19   Glean Salvo, NP  polyethylene glycol [facility] Hal) packet Take 17 g by mouth daily.    [provider]  SUMAtriptan (IMITREX) 50 MG tablet TAKE 1 TABLET AT ONSET, MAY REPEAT IN 2 HOURS IF MIGRAINE NOT RELIEVED. MAX OF 2 IN 24 HOURS. 05/25/19   Glean Salvo, NP  traZODone (DESYREL) 100 MG tablet Take 1 tablet (100 mg total) by mouth at bedtime. 03/24/18   Rhetta Mura, MD  venlafaxine XR (EFFEXOR-XR) 150 MG 24 hr capsule Take 150 mg by mouth daily. 02/12/18   [provider]    Allergies    Codeine  Review of Systems   Review of Systems  Constitutional: Negative for fever.  HENT: Negative for rhinorrhea and sore throat.   Eyes: Negative for redness.  Respiratory: Negative for cough.   Cardiovascular: Negative for chest pain.  Gastrointestinal: Negative for abdominal pain, diarrhea, nausea and vomiting.  Genitourinary: Negative for dysuria, frequency, hematuria and urgency.  Musculoskeletal: Positive for arthralgias and myalgias. Negative for back pain and joint swelling.  Skin: Negative for rash.  Neurological: Negative for numbness and headaches.    Physical Exam Updated Vital Signs BP 118/72 (BP Location: Right Arm)   Pulse 69   Temp 99.4 F (37.4 C) (Oral)   Resp 16   Ht 5\' 2"  (1.575 m)   Wt 49.9 kg   SpO2 98%   BMI 20.12 kg/m   Physical Exam Vitals and nursing note reviewed.  Constitutional:      General: She is not in acute distress.    Appearance: She is well-developed.  HENT:     Head: Normocephalic.     Comments: Two small superficial lesions noted L forehead -- these are related to recent skin biopsy performed at dermatologist -- unrelated to fall today    Right Ear: External ear normal.     Left Ear: External ear normal.     Nose: Nose normal.  Eyes:     Conjunctiva/sclera: Conjunctivae normal.  Cardiovascular:     Rate and Rhythm: Normal rate and regular rhythm. Frequent extrasystoles are present.    Pulses:          Dorsalis pedis pulses are 2+ on the right side and 2+ on the left side.     Heart sounds: Murmur heard.      Comments: 2+ DP pulses on arrival. Toes and feet appear well-perfused.  Pulmonary:     Effort: No respiratory distress.     Breath sounds: No wheezing, rhonchi or rales.  Abdominal:     Palpations: Abdomen is soft.     Tenderness: There is no abdominal tenderness. There is  no guarding or rebound.  Musculoskeletal:     Cervical back: Normal range of motion and neck supple.     Right hip: Tenderness present. No deformity. Decreased range of motion.     Right upper leg: No deformity.     Right knee: No deformity.     Right lower leg: No edema.     Left  lower leg: No edema.     Comments: ROM unable to be tested 2/2 traction splint in place.   Skin:    General: Skin is warm and dry.     Findings: No rash.  Neurological:     General: No focal deficit present.     Mental Status: She is alert. Mental status is at baseline.     Motor: No weakness.  Psychiatric:        Mood and Affect: Mood normal.     ED Results / Procedures / Treatments   Labs (all labs ordered are listed, but only abnormal results are displayed) Labs Reviewed  BASIC METABOLIC PANEL - Abnormal; Notable for the following components:      Result Value   Glucose, Bld 107 (*)    Creatinine, Ser 1.05 (*)    GFR calc non Af Amer 49 (*)    GFR calc Af Amer 56 (*)    All other components within normal limits  CBC WITH DIFFERENTIAL/PLATELET - Abnormal; Notable for the following components:   WBC 14.2 (*)    Neutro Abs 12.3 (*)    All other components within normal limits  SARS CORONAVIRUS 2 BY RT PCR (HOSPITAL ORDER, PERFORMED IN [facility])  PROTIME-INR  URINALYSIS, ROUTINE W REFLEX MICROSCOPIC  TYPE AND SCREEN  ABO/RH    ED ECG REPORT   Date: 10/05/2019  Rate: 63  Rhythm: normal sinus rhythm, PACs  QRS Axis: left  Intervals: normal  ST/T Wave abnormalities: normal  Conduction Disutrbances:left anterior fascicular block  Narrative Interpretation:   Old EKG Reviewed: unchanged except more ectopy from 08/2015  I have personally reviewed the EKG tracing and agree with the computerized printout as noted.  Radiology DG Chest Port 1 View  Result Date: 10/05/2019 CLINICAL DATA:  Preop fall EXAM: PORTABLE CHEST 1 VIEW COMPARISON:  03/17/2018 FINDINGS: No focal opacity  or pleural effusion. Mild cardiomegaly. No pneumothorax. IMPRESSION: No active disease. Mild cardiomegaly. Electronically Signed   By: Jasmine PangKim  Fujinaga M.D.   On: 10/05/2019 17:26   DG FEMUR, MIN 2 VIEWS RIGHT  Result Date: 10/05/2019 CLINICAL DATA:  Fall, leg deformity EXAM: RIGHT FEMUR 2 VIEWS COMPARISON:  None. FINDINGS: There is an spiral displaced and angulated fracture through the mid shaft of the right femur. No subluxation or dislocation. IMPRESSION: Spiral displaced and angulated mid femoral fracture. Electronically Signed   By: Charlett NoseKevin  Dover M.D.   On: 10/05/2019 17:09    Procedures Procedures (including critical care time)  Medications Ordered in ED Medications  morphine 4 MG/ML injection 4 mg (has no administration in time range)  ondansetron (ZOFRAN) injection 4 mg (4 mg Intravenous Given 10/05/19 1724)  morphine 2 MG/ML injection 2 mg (2 mg Intravenous Given 10/05/19 1725)    ED Course  I have reviewed the triage vital signs and the nursing notes.  Pertinent labs & imaging results that were available during my care of the patient were reviewed by me and considered in my medical decision making (see chart for details).  Patient seen and examined. Work-up initiated. Medications ordered. Pain controlled.  On arrival, patient with palpable DP pulses bilaterally.  Right lower extremity appears well perfused.  Normal sensation distally.  Vital signs reviewed and are as follows: BP 118/72 (BP Location: Right Arm)   Pulse 69   Temp 99.4 F (37.4 C) (Oral)   Resp 16   Ht 5\' 2"  (1.575 m)   Wt 49.9 kg   SpO2 98%  BMI 20.12 kg/m   4:06 PM Daughter [name] at bedside. Updated on plan. Additional history obtained.   5:16 PM Reviewed x-rays. Pt/daughter updated. LE exam unchanged. 2+ DP pulses and sensation intact. Will consult orthopedic surgery. Labs pending.   5:33 PM Discussed case with Dr. Jena Gauss. Reccs: NPO after midnight, Bucks traction 10lbs. Admit to hospitalist.   6:51 PM  Reviewed labs. Hospitalist paged.   7:09 PM Spoke earlier with Dr. Cyndia Bent.     MDM Rules/Calculators/A&P                          Admit.   Final Clinical Impression(s) / ED Diagnoses Final diagnoses:  Closed displaced spiral fracture of shaft of right femur, initial encounter [facility])    Rx / DC Orders ED Discharge Orders    None       [name], Cordelia Poche 10/05/19 1909    Maia Plan, MD 10/10/19 [phone]

## 2019-10-05 NOTE — ED Triage Notes (Signed)
To ED via [facility] from home, was working in the garden, tripped over a block, right leg deformed, Hare traction on leg, received Fentanyl enroute, pulses present in foot. IV right forearm

## 2019-10-06 ENCOUNTER — Encounter (HOSPITAL_COMMUNITY)
Admission: EM | Disposition: A | Discharge status: Home / Self Care | Payer: Self-pay | Source: Home / Self Care | Attending: Internal Medicine

## 2019-10-06 ENCOUNTER — Inpatient Hospital Stay (HOSPITAL_COMMUNITY): Payer: Medicare PPO

## 2019-10-06 ENCOUNTER — Inpatient Hospital Stay (HOSPITAL_COMMUNITY): Payer: Medicare PPO | Admitting: Anesthesiology

## 2019-10-06 ENCOUNTER — Other Ambulatory Visit: Payer: Self-pay

## 2019-10-06 ENCOUNTER — Encounter (HOSPITAL_COMMUNITY): Payer: Self-pay | Admitting: Family Medicine

## 2019-10-06 DIAGNOSIS — D72829 Elevated white blood cell count, unspecified: Secondary | ICD-10-CM

## 2019-10-06 DIAGNOSIS — S72001A Fracture of unspecified part of neck of right femur, initial encounter for closed fracture: Secondary | ICD-10-CM

## 2019-10-06 DIAGNOSIS — I10 Essential (primary) hypertension: Secondary | ICD-10-CM

## 2019-10-06 DIAGNOSIS — F0390 Unspecified dementia without behavioral disturbance: Secondary | ICD-10-CM

## 2019-10-06 DIAGNOSIS — N179 Acute kidney failure, unspecified: Secondary | ICD-10-CM

## 2019-10-06 HISTORY — PX: FEMUR IM NAIL: SHX1597

## 2019-10-06 LAB — SURGICAL PCR SCREEN
MRSA, PCR: NEGATIVE
Staphylococcus aureus: NEGATIVE

## 2019-10-06 LAB — ABO/RH: ABO/RH(D): O POS

## 2019-10-06 SURGERY — INSERTION, INTRAMEDULLARY ROD, FEMUR
Anesthesia: General | Laterality: Right

## 2019-10-06 MED ORDER — GABAPENTIN 100 MG PO CAPS
100.0000 mg | ORAL_CAPSULE | Freq: Two times a day (BID) | ORAL | Status: DC
  Administered 2019-10-06 – 2019-10-11 (×11): 100 mg via ORAL
  Filled 2019-10-06 (×11): qty 1

## 2019-10-06 MED ORDER — PROPOFOL 10 MG/ML IV BOLUS
INTRAVENOUS | Status: DC | PRN
  Administered 2019-10-06 (×2): 50 mg via INTRAVENOUS

## 2019-10-06 MED ORDER — AMLODIPINE BESYLATE 5 MG PO TABS
5.0000 mg | ORAL_TABLET | Freq: Every evening | ORAL | Status: DC
  Filled 2019-10-06: qty 1

## 2019-10-06 MED ORDER — CEFAZOLIN SODIUM-DEXTROSE 1-4 GM/50ML-% IV SOLN
1.0000 g | Freq: Four times a day (QID) | INTRAVENOUS | Status: AC
  Administered 2019-10-06 – 2019-10-07 (×3): 1 g via INTRAVENOUS
  Filled 2019-10-06 (×3): qty 50

## 2019-10-06 MED ORDER — PHENYLEPHRINE 40 MCG/ML (10ML) SYRINGE FOR IV PUSH (FOR BLOOD PRESSURE SUPPORT)
PREFILLED_SYRINGE | INTRAVENOUS | Status: AC
  Filled 2019-10-06: qty 10

## 2019-10-06 MED ORDER — DEXAMETHASONE SODIUM PHOSPHATE 10 MG/ML IJ SOLN
INTRAMUSCULAR | Status: DC | PRN
  Administered 2019-10-06: 10 mg via INTRAVENOUS

## 2019-10-06 MED ORDER — POLYETHYLENE GLYCOL 3350 17 G PO PACK: 17.0000 g | PACK | Freq: Every day | ORAL | Status: DC | PRN

## 2019-10-06 MED ORDER — DOCUSATE SODIUM 100 MG PO CAPS
100.0000 mg | ORAL_CAPSULE | Freq: Two times a day (BID) | ORAL | Status: DC
  Administered 2019-10-06 – 2019-10-11 (×10): 100 mg via ORAL
  Filled 2019-10-06 (×10): qty 1

## 2019-10-06 MED ORDER — DEXAMETHASONE SODIUM PHOSPHATE 10 MG/ML IJ SOLN
INTRAMUSCULAR | Status: AC
  Filled 2019-10-06: qty 1

## 2019-10-06 MED ORDER — ROCURONIUM BROMIDE 10 MG/ML (PF) SYRINGE
PREFILLED_SYRINGE | INTRAVENOUS | Status: AC
  Filled 2019-10-06: qty 10

## 2019-10-06 MED ORDER — ONDANSETRON HCL 4 MG/2ML IJ SOLN: 4.0000 mg | Freq: Four times a day (QID) | INTRAMUSCULAR | Status: DC | PRN

## 2019-10-06 MED ORDER — LACTATED RINGERS IV SOLN: INTRAVENOUS | Status: DC

## 2019-10-06 MED ORDER — FENTANYL CITRATE (PF) 100 MCG/2ML IJ SOLN: 25.0000 ug | INTRAMUSCULAR | Status: DC | PRN

## 2019-10-06 MED ORDER — ACETAMINOPHEN 325 MG PO TABS: 325.0000 mg | ORAL_TABLET | Freq: Four times a day (QID) | ORAL | Status: DC | PRN

## 2019-10-06 MED ORDER — DONEPEZIL HCL 10 MG PO TABS
10.0000 mg | ORAL_TABLET | Freq: Every day | ORAL | Status: DC
  Administered 2019-10-06 – 2019-10-10 (×6): 10 mg via ORAL
  Filled 2019-10-06 (×6): qty 1

## 2019-10-06 MED ORDER — DOCUSATE SODIUM 100 MG PO CAPS: 100.0000 mg | ORAL_CAPSULE | Freq: Every day | ORAL | Status: DC

## 2019-10-06 MED ORDER — ONDANSETRON HCL 4 MG/2ML IJ SOLN
INTRAMUSCULAR | Status: AC
  Filled 2019-10-06: qty 2

## 2019-10-06 MED ORDER — CHLORHEXIDINE GLUCONATE 0.12 % MT SOLN
15.0000 mL | Freq: Once | OROMUCOSAL | Status: AC
  Filled 2019-10-06: qty 15

## 2019-10-06 MED ORDER — ACETAMINOPHEN 500 MG PO TABS
1000.0000 mg | ORAL_TABLET | Freq: Three times a day (TID) | ORAL | Status: DC
  Administered 2019-10-06 – 2019-10-11 (×16): 1000 mg via ORAL
  Filled 2019-10-06 (×17): qty 2

## 2019-10-06 MED ORDER — TRAZODONE HCL 50 MG PO TABS
100.0000 mg | ORAL_TABLET | Freq: Every day | ORAL | Status: DC
  Administered 2019-10-06 – 2019-10-10 (×6): 100 mg via ORAL
  Filled 2019-10-06 (×7): qty 2

## 2019-10-06 MED ORDER — ROCURONIUM BROMIDE 100 MG/10ML IV SOLN
INTRAVENOUS | Status: DC | PRN
  Administered 2019-10-06: 30 mg via INTRAVENOUS
  Administered 2019-10-06: 20 mg via INTRAVENOUS

## 2019-10-06 MED ORDER — LIDOCAINE 2% (20 MG/ML) 5 ML SYRINGE
INTRAMUSCULAR | Status: AC
  Filled 2019-10-06: qty 5

## 2019-10-06 MED ORDER — ONDANSETRON HCL 4 MG/2ML IJ SOLN
INTRAMUSCULAR | Status: DC | PRN
  Administered 2019-10-06: 4 mg via INTRAVENOUS

## 2019-10-06 MED ORDER — METOCLOPRAMIDE HCL 5 MG PO TABS: 5.0000 mg | ORAL_TABLET | Freq: Three times a day (TID) | ORAL | Status: DC | PRN

## 2019-10-06 MED ORDER — FENTANYL CITRATE (PF) 250 MCG/5ML IJ SOLN
INTRAMUSCULAR | Status: DC | PRN
  Administered 2019-10-06 (×4): 50 ug via INTRAVENOUS

## 2019-10-06 MED ORDER — OXYCODONE HCL 5 MG PO TABS: 5.0000 mg | ORAL_TABLET | Freq: Four times a day (QID) | ORAL | Status: DC | PRN

## 2019-10-06 MED ORDER — VENLAFAXINE HCL ER 150 MG PO CP24
150.0000 mg | ORAL_CAPSULE | Freq: Every day | ORAL | Status: DC
  Administered 2019-10-07 – 2019-10-11 (×5): 150 mg via ORAL
  Filled 2019-10-06 (×5): qty 1

## 2019-10-06 MED ORDER — ALBUMIN HUMAN 5 % IV SOLN: INTRAVENOUS | Status: DC | PRN

## 2019-10-06 MED ORDER — SUGAMMADEX SODIUM 200 MG/2ML IV SOLN
INTRAVENOUS | Status: DC | PRN
  Administered 2019-10-06: 200 mg via INTRAVENOUS

## 2019-10-06 MED ORDER — TRAMADOL HCL 50 MG PO TABS: 50.0000 mg | ORAL_TABLET | Freq: Three times a day (TID) | ORAL | Status: DC | PRN

## 2019-10-06 MED ORDER — METHOCARBAMOL 500 MG PO TABS
500.0000 mg | ORAL_TABLET | Freq: Two times a day (BID) | ORAL | Status: DC | PRN
  Administered 2019-10-09: 500 mg via ORAL
  Filled 2019-10-06 (×2): qty 1

## 2019-10-06 MED ORDER — 0.9 % SODIUM CHLORIDE (POUR BTL) OPTIME
TOPICAL | Status: DC | PRN
  Administered 2019-10-06: 1000 mL

## 2019-10-06 MED ORDER — FENTANYL CITRATE (PF) 250 MCG/5ML IJ SOLN
INTRAMUSCULAR | Status: AC
  Filled 2019-10-06: qty 5

## 2019-10-06 MED ORDER — ESMOLOL HCL 100 MG/10ML IV SOLN
INTRAVENOUS | Status: DC | PRN
  Administered 2019-10-06: 20 mg via INTRAVENOUS
  Administered 2019-10-06: 15 mg via INTRAVENOUS

## 2019-10-06 MED ORDER — METOCLOPRAMIDE HCL 5 MG/ML IJ SOLN: 5.0000 mg | Freq: Three times a day (TID) | INTRAMUSCULAR | Status: DC | PRN

## 2019-10-06 MED ORDER — CEFAZOLIN SODIUM-DEXTROSE 2-4 GM/100ML-% IV SOLN
2.0000 g | Freq: Once | INTRAVENOUS | Status: AC
  Administered 2019-10-06: 2 g via INTRAVENOUS
  Filled 2019-10-06: qty 100

## 2019-10-06 MED ORDER — PHENYLEPHRINE 40 MCG/ML (10ML) SYRINGE FOR IV PUSH (FOR BLOOD PRESSURE SUPPORT)
PREFILLED_SYRINGE | INTRAVENOUS | Status: DC | PRN
  Administered 2019-10-06 (×4): 80 ug via INTRAVENOUS

## 2019-10-06 MED ORDER — CHLORHEXIDINE GLUCONATE 0.12 % MT SOLN
OROMUCOSAL | Status: AC
  Administered 2019-10-06: 15 mL via OROMUCOSAL
  Filled 2019-10-06: qty 15

## 2019-10-06 MED ORDER — PROPOFOL 10 MG/ML IV BOLUS
INTRAVENOUS | Status: AC
  Filled 2019-10-06: qty 20

## 2019-10-06 MED ORDER — ENOXAPARIN SODIUM 40 MG/0.4ML ~~LOC~~ SOLN
40.0000 mg | SUBCUTANEOUS | Status: DC
  Administered 2019-10-07 – 2019-10-11 (×5): 40 mg via SUBCUTANEOUS
  Filled 2019-10-06 (×5): qty 0.4

## 2019-10-06 MED ORDER — PHENYLEPHRINE HCL-NACL 10-0.9 MG/250ML-% IV SOLN
INTRAVENOUS | Status: DC | PRN
  Administered 2019-10-06: 50 ug/min via INTRAVENOUS

## 2019-10-06 MED ORDER — HYDROCODONE-ACETAMINOPHEN 5-325 MG PO TABS: 1.0000 | ORAL_TABLET | Freq: Four times a day (QID) | ORAL | Status: DC | PRN

## 2019-10-06 MED ORDER — MEMANTINE HCL 10 MG PO TABS
10.0000 mg | ORAL_TABLET | Freq: Two times a day (BID) | ORAL | Status: DC
  Administered 2019-10-06 – 2019-10-11 (×11): 10 mg via ORAL
  Filled 2019-10-06 (×11): qty 1

## 2019-10-06 MED ORDER — ONDANSETRON HCL 4 MG PO TABS: 4.0000 mg | ORAL_TABLET | Freq: Four times a day (QID) | ORAL | Status: DC | PRN

## 2019-10-06 SURGICAL SUPPLY — 54 items
BIT DRILL SHORT 4.2 (BIT) IMPLANT
BNDG COHESIVE 6X5 TAN STRL LF (GAUZE/BANDAGES/DRESSINGS) IMPLANT
BRUSH SCRUB EZ PLAIN DRY (MISCELLANEOUS) ×6 IMPLANT
COVER PERINEAL POST (MISCELLANEOUS) ×3 IMPLANT
COVER SURGICAL LIGHT HANDLE (MISCELLANEOUS) ×6 IMPLANT
COVER WAND RF STERILE (DRAPES) ×3 IMPLANT
DRAPE C-ARMOR (DRAPES) ×3 IMPLANT
DRAPE HALF SHEET 40X57 (DRAPES) IMPLANT
DRAPE ORTHO SPLIT 77X108 STRL (DRAPES) ×6
DRAPE SURG ORHT 6 SPLT 77X108 (DRAPES) ×2 IMPLANT
DRAPE U-SHAPE 47X51 STRL (DRAPES) ×3 IMPLANT
DRILL BIT SHORT 4.2 (BIT) ×3
DRSG MEPILEX BORDER 4X4 (GAUZE/BANDAGES/DRESSINGS) ×9 IMPLANT
DRSG MEPILEX BORDER 4X8 (GAUZE/BANDAGES/DRESSINGS) ×3 IMPLANT
ELECT REM PT RETURN 9FT ADLT (ELECTROSURGICAL) ×3
ELECTRODE REM PT RTRN 9FT ADLT (ELECTROSURGICAL) ×1 IMPLANT
GLOVE BIO SURGEON STRL SZ7.5 (GLOVE) ×3 IMPLANT
GLOVE BIO SURGEON STRL SZ8 (GLOVE) ×3 IMPLANT
GLOVE BIOGEL PI IND STRL 7.5 (GLOVE) ×1 IMPLANT
GLOVE BIOGEL PI IND STRL 8 (GLOVE) ×1 IMPLANT
GLOVE BIOGEL PI INDICATOR 7.5 (GLOVE) ×2
GLOVE BIOGEL PI INDICATOR 8 (GLOVE) ×2
GOWN STRL REUS W/ TWL LRG LVL3 (GOWN DISPOSABLE) ×2 IMPLANT
GOWN STRL REUS W/ TWL XL LVL3 (GOWN DISPOSABLE) ×1 IMPLANT
GOWN STRL REUS W/TWL LRG LVL3 (GOWN DISPOSABLE) ×6
GOWN STRL REUS W/TWL XL LVL3 (GOWN DISPOSABLE) ×3
GUIDEWIRE 3.2X400 (WIRE) ×6 IMPLANT
KIT BASIN OR (CUSTOM PROCEDURE TRAY) ×3 IMPLANT
KIT TURNOVER KIT B (KITS) ×3 IMPLANT
MANIFOLD NEPTUNE II (INSTRUMENTS) ×3 IMPLANT
NAIL CANN TI FEM RT GT 11X360 (Nail) ×2 IMPLANT
NS IRRIG 1000ML POUR BTL (IV SOLUTION) ×3 IMPLANT
PACK GENERAL/GYN (CUSTOM PROCEDURE TRAY) ×3 IMPLANT
PAD ARMBOARD 7.5X6 YLW CONV (MISCELLANEOUS) ×6 IMPLANT
REAMER ROD DEEP FLUTE 2.5X950 (INSTRUMENTS) ×2 IMPLANT
SCREW LOCK STAR 5X42 (Screw) ×2 IMPLANT
SCREW LOCK STAR 5X46 (Screw) ×2 IMPLANT
SCREW LOCK STAR 5X48 (Screw) ×2 IMPLANT
SCREW LOCK STAR 5X52 (Screw) ×2 IMPLANT
SCREW RECON 6.5 X 85MM (Screw) ×2 IMPLANT
SCREW RECON TI T25 6.5X80 (Screw) ×2 IMPLANT
STAPLER VISISTAT 35W (STAPLE) ×3 IMPLANT
STOCKINETTE IMPERVIOUS LG (DRAPES) IMPLANT
SUT ETHILON 3 0 PS 1 (SUTURE) ×5 IMPLANT
SUT VIC AB 0 CT1 27 (SUTURE) ×6
SUT VIC AB 0 CT1 27XBRD ANBCTR (SUTURE) ×1 IMPLANT
SUT VIC AB 1 CT1 27 (SUTURE) ×3
SUT VIC AB 1 CT1 27XBRD ANBCTR (SUTURE) ×1 IMPLANT
SUT VIC AB 2-0 CT1 27 (SUTURE) ×3
SUT VIC AB 2-0 CT1 TAPERPNT 27 (SUTURE) ×1 IMPLANT
SYR BULB IRRIG 60ML STRL (SYRINGE) ×2 IMPLANT
TOWEL GREEN STERILE (TOWEL DISPOSABLE) ×6 IMPLANT
TOWEL GREEN STERILE FF (TOWEL DISPOSABLE) ×7 IMPLANT
WATER STERILE IRR 1000ML POUR (IV SOLUTION) ×1 IMPLANT

## 2019-10-06 NOTE — Op Note (Signed)
10/06/2019  2:21 PM  PATIENT:  Jeanne Lopez  86 y.o. female  PRE-OPERATIVE DIAGNOSIS:  Right femoral shaft fracture  POST-OPERATIVE DIAGNOSIS:  Right femoral shaft fracture  PROCEDURE:  Procedure(s): INTRAMEDULLARY (IM) NAIL FEMORAL (Right) with Synthes Troch entry FRN 11 x 380 mm statically locked  SURGEON:  Surgeon(s) and Role:    Myrene Galas, MD - Primary  PHYSICIAN ASSISTANT: Montez Morita, PA-C  ANESTHESIA:   general  I/O:  Total I/O In: 1350 [I.V.:1000; IV Piggyback:350] Out: 100 [Blood:100]  SPECIMEN:  No Specimen  TOURNIQUET:  * No tourniquets in log *  COMPLICATIONS: NONE  DICTATION: .Note written in EPIC  DISPOSITION: TO PACU  CONDITION: STABLE  DELAY START OF DVT PROPHYLAXIS BECAUSE OF BLEEDING RISK: NO  BRIEF SUMMARY AND INDICATION OF PROCEDURE:  Jeanne Lopez is a 86 y.o. year- old with multiple medical problems.  The risks and benefits of surgical treatment including the potential for malunion, nonunion, symptomatic hardware, heart attack, stroke, neurovascular injury, bleeding, and others were discussed with the daughter.  After acknowledging these, consent was provided to proceed.  BRIEF SUMMARY OF PROCEDURE:  The patient was taken to the operating room where general anesthesia was induced.  She was positioned supine on the Radiolucent fracture table. A thorough scrub and wash with chlorhexidine and then Betadine scrub and paint was performed.  After sterile drapes and time-out, a long instrument was used to identify the appropriate starting position under C-arm on both AP and lateral images.  A 3 cm incision was made proximal to the greater trochanter.  The curved cannulated awl was inserted just medial to the tip of the lateral trochanter and then the starting guidewire advanced into the proximal femur.  This was checked on AP and lateral views.  The starting reamer was engaged while protecting the soft tissue with a sleeve.  The curved  ball-tipped guidewire was then inserted, making sure it safely crossed the fracture site and then stayed as posterior as possible in the distal femur. We sequentially reamed up to 12 mm, making sure the fracture remained reduced during reaming, and inserted an 12 x 380 mm nail to the appropriate depth.  The guidewire for the lag screw was then inserted with anteversion to make sure it was in a center-center Position within the femoral head.  This was measured and the lag screw placed with excellent purchase and position checked on both views.  A proximal lock was placed along the calcar in classic second-generation recon fashion and then additional proximal-to-posterior medial screw from greater to lesser troch was placed as well. The Synthes colinear clamp was introduced through the recon screw hole to dial in compression at the fracture site. I turned attention distally where two locking bolts were placed using perfect circle technique.  Final images were obtained showing excellent reduction, hardware placement, trajectory and length.  Montez Morita, PA-C assisted throughout the case with establishment of reduction, maintaining reduction during reaming and instrumentation, as well as wound closure. We irrigated and closed the wound in standard layered fashion using running 0 and figure-of-eight 0 Vicryl for the vastus lateralis, interrupted figure-of- eight #1 Vicryl for the tensor and then 2-0 Vicryl and 2-0 nylon for the skin.  Sterile gently compressive dressing was applied.  The patient was awakened from anesthesia and transported to the PACU in stable condition.   PROGNOSIS:  Jeanne Lopez should do well with high rate of healing, given the construct we were able to obtain. Her  bone, however, was profoundly osteoporotic increasing the chances of loss of fixation and additional fracture. She will have immediate range of motion of the knee, hip and ankle but will hold off on weightbearing as  tolerated until four weeks.  She will be on formal pharmacologic DVT prophylaxis and then follow up in the office in 2 weeks for removal of sutures.     Jeanne Lopez, M.D.

## 2019-10-06 NOTE — Transfer of Care (Signed)
Immediate Anesthesia Transfer of Care Note  Patient: Jeanne Lopez  Procedure(s) Performed: INTRAMEDULLARY (IM) NAIL FEMORAL (Right )  Patient Location: PACU  Anesthesia Type:General  Level of Consciousness: awake and alert   Airway & Oxygen Therapy: Patient Spontanous Breathing and Patient connected to nasal cannula oxygen  Post-op Assessment: Report given to RN and Post -op Vital signs reviewed and stable  Post vital signs: Reviewed and stable  Last Vitals:  Vitals Value Taken Time  BP 120/56 10/06/19 1407  Temp 36.5 C 10/06/19 1306  Pulse 70 10/06/19 1411  Resp 20 10/06/19 1411  SpO2 96 % 10/06/19 1411  Vitals shown include unvalidated device data.  Last Pain:  Vitals:   10/06/19 1306  TempSrc:   PainSc: Asleep         Complications: No complications documented.

## 2019-10-06 NOTE — Progress Notes (Signed)
1500- Pt returned to unit from OR, still drowsy. Pt was easily aroused to voice but quickly returned to sleep. VSS, pt oriented to self but reoriented to place, time, and situation.   1800- pt now more awake, VSS, pt oriented to self, and situation, but reoriented to time and place. Will continue to monitor

## 2019-10-06 NOTE — Anesthesia Procedure Notes (Signed)
Procedure Name: Intubation Date/Time: 10/06/2019 10:49 AM Performed by: Marena Chancy, CRNA Pre-anesthesia Checklist: Patient identified, Emergency Drugs available, Suction available and Patient being monitored Patient Re-evaluated:Patient Re-evaluated prior to induction Oxygen Delivery Method: Circle System Utilized Preoxygenation: Pre-oxygenation with 100% oxygen Induction Type: IV induction Ventilation: Mask ventilation without difficulty Laryngoscope Size: Miller and 2 Grade View: Grade II Tube type: Oral Tube size: 7.0 mm Number of attempts: 1 Airway Equipment and Method: Stylet and Oral airway Placement Confirmation: ETT inserted through vocal cords under direct vision,  positive ETCO2 and breath sounds checked- equal and bilateral Tube secured with: Tape Dental Injury: Teeth and Oropharynx as per pre-operative assessment

## 2019-10-06 NOTE — H&P (Addendum)
History and Physical    Jeanne Lopez HYQ:[id number] DOB: 16-Feb-1932 DOA: 10/05/2019  PCP: Merri Brunette, MD  Patient coming from: Home  I have personally briefly reviewed patient's old medical records in [facility]  Chief Complaint: Fall  HPI: Jeanne Lopez is a 86 y.o. female with medical history significant for dementia, osteoporosis, hypertension, hyperlipidemia and depression who presents status post mechanical fall.  Daughter at bedside provides most of the history.  Patient was outside walking in the yard today when she stumbled and fell on her right side into her flower bed.  She then called out for help and her daughter came out and saw her and caught EMS.  She states that patient was otherwise in her normal state of health.  Denies any dizziness or lightheadedness.  No chest pain or palpitations prior to this episode.  Daughter has noted though for the past week she is seem to be a little bit more unsteady with her feet but normally does not need any assistance with ambulation.  She continues to have good appetite.  No fevers.  No nausea, vomiting or diarrhea.  No other signs of infection.  ED Course: She was afebrile, mildly bradycardic normotensive and was placed on 2 L via nasal cannula although without any significantly documented hypoxia. CBC shows leukocytosis of 14.2.  Mildly elevated creatinine of 1.05 from a prior of 0.56.  Right femur x-ray significant for spiral displaced and angulated mid femoral fracture.  Review of Systems:  Patient alert and oriented x4 but unable to provide much history.  Past Medical History:  Diagnosis Date  . Anxiety   . Depression   . Heart murmur   . Insomnia   . Memory disorder 04/02/2017  . Meniere's disease   . Migraine   . Osteoporosis     Past Surgical History:  Procedure Laterality Date  . APPENDECTOMY    . BREAST LUMPECTOMY Right   . DILATION AND CURETTAGE OF UTERUS       reports that she has never smoked. She  has never used smokeless tobacco. She reports that she does not drink alcohol and does not use drugs. Social History  Allergies  Allergen Reactions  . Codeine Nausea And Vomiting    Family History  Problem Relation Age of Onset  . Hypertension Brother   . Cancer Sister   . Hypertension Sister   . Hypertension Mother   . Heart attack Mother   . Hypertension Father   . Stroke Father   . Alcohol abuse Daughter   . Depression Daughter   . Drug abuse Daughter   . Alcohol abuse Son   . Depression Son   . Drug abuse Son   . CVA Maternal Grandmother   . Kidney disease Neg Hx   . Hyperlipidemia Neg Hx   . Heart disease Neg Hx   . Early death Neg Hx   . Arthritis Neg Hx      Prior to Admission medications   Medication Sig Start Date End Date Taking? Authorizing Provider  amLODipine (NORVASC) 5 MG tablet Take 1 tablet (5 mg total) by mouth daily. Patient taking differently: Take 5 mg by mouth every evening.  03/24/18  Yes Rhetta Mura, MD  docusate sodium (COLACE) 100 MG capsule Take 100 mg by mouth daily.   Yes [provider]  donepezil (ARICEPT) 10 MG tablet Take 1 tablet (10 mg total) by mouth at bedtime. 05/25/19  Yes Glean Salvo, NP  gabapentin (NEURONTIN)  100 MG capsule Take 1 capsule (100 mg total) by mouth 2 (two) times daily. 05/25/19  Yes Glean SalvoSlack, Sarah J, NP  memantine (NAMENDA) 10 MG tablet Take 1 tablet (10 mg total) by mouth 2 (two) times daily. 07/18/19  Yes Glean SalvoSlack, Sarah J, NP  polyethylene glycol (MIRALAX / GLYCOLAX) packet Take 17 g by mouth daily as needed for mild constipation.    Yes [provider]  traZODone (DESYREL) 100 MG tablet Take 1 tablet (100 mg total) by mouth at bedtime. 03/24/18  Yes Rhetta MuraSamtani, Jai-Gurmukh, MD  venlafaxine XR (EFFEXOR-XR) 150 MG 24 hr capsule Take 150 mg by mouth daily. 02/12/18  Yes [provider]  SUMAtriptan (IMITREX) 50 MG tablet TAKE 1 TABLET AT ONSET, MAY REPEAT IN 2 HOURS IF MIGRAINE NOT RELIEVED. MAX  OF 2 IN 24 HOURS. Patient not taking: Reported on 10/05/2019 05/25/19   Glean SalvoSlack, Sarah J, NP    Physical Exam: Vitals:   10/05/19 2145 10/05/19 2200 10/05/19 2215 10/05/19 2329  BP: (!) 115/103 (!) 132/49 (!) 119/55 (!) 134/59  Pulse: (!) 57 (!) 58 (!) 59 61  Resp: 16 12 11 15   Temp:    98.5 F (36.9 C)  TempSrc:    Oral  SpO2: 99% 98% 99% 100%  Weight:      Height:        Constitutional: NAD, calm, comfortable, elderly female laying flat in bed Vitals:   10/05/19 2145 10/05/19 2200 10/05/19 2215 10/05/19 2329  BP: (!) 115/103 (!) 132/49 (!) 119/55 (!) 134/59  Pulse: (!) 57 (!) 58 (!) 59 61  Resp: 16 12 11 15   Temp:    98.5 F (36.9 C)  TempSrc:    Oral  SpO2: 99% 98% 99% 100%  Weight:      Height:       Eyes: PERRL, lids and conjunctivae normal ENMT: Mucous membranes are moist.  Neck: normal, supple Respiratory: clear to auscultation bilaterally, no wheezing, no crackles. Normal respiratory effort. No accessory muscle use.  Cardiovascular: Regular rate and rhythm, no murmurs / rubs / gallops. No extremity edema. 2+ pedal pulses. .  Abdomen: no tenderness, no masses palpated. Bowel sounds positive.  Musculoskeletal: no clubbing / cyanosis. No joint deformity upper and lower extremities. Good ROM, no contractures. Normal muscle tone.  Skin: no rashes, lesions, ulcers. No induration Neurologic: CN 2-12 grossly intact. Sensation intact. Strength 5/5 in the left lower extremity.  Right lower extremity in Buck's traction with intact sensation and +2 dorsalis pedis pulse Psychiatric: Normal judgment and insight. Alert and oriented x 3 although had some difficulty coming up with the year. Normal mood.     Labs on Admission: I have personally reviewed following labs and imaging studies  CBC: Recent Labs  Lab 10/05/19 1726  WBC 14.2*  NEUTROABS 12.3*  HGB 13.1  HCT 42.1  MCV 97.5  PLT 186   Basic Metabolic Panel: Recent Labs  Lab 10/05/19 1726  NA 140  K 4.3  CL 105    CO2 24  GLUCOSE 107*  BUN 19  CREATININE 1.05*  CALCIUM 9.3   GFR: Estimated Creatinine Clearance: 31.4 mL/min (A) (by C-G formula based on SCr of 1.05 mg/dL (H)). Liver Function Tests: No results for input(s): AST, ALT, ALKPHOS, BILITOT, PROT, ALBUMIN in the last 168 hours. No results for input(s): LIPASE, AMYLASE in the last 168 hours. No results for input(s): AMMONIA in the last 168 hours. Coagulation Profile: Recent Labs  Lab 10/05/19 1726  INR 1.0  Cardiac Enzymes: No results for input(s): CKTOTAL, CKMB, CKMBINDEX, TROPONINI in the last 168 hours. BNP (last 3 results) No results for input(s): PROBNP in the last 8760 hours. HbA1C: No results for input(s): HGBA1C in the last 72 hours. CBG: No results for input(s): GLUCAP in the last 168 hours. Lipid Profile: No results for input(s): CHOL, HDL, LDLCALC, TRIG, CHOLHDL, LDLDIRECT in the last 72 hours. Thyroid Function Tests: No results for input(s): TSH, T4TOTAL, FREET4, T3FREE, THYROIDAB in the last 72 hours. Anemia Panel: No results for input(s): VITAMINB12, FOLATE, FERRITIN, TIBC, IRON, RETICCTPCT in the last 72 hours. Urine analysis:    Component Value Date/Time   COLORURINE YELLOW 03/17/2018 2151   APPEARANCEUR CLEAR 03/17/2018 2151   LABSPEC 1.018 03/17/2018 2151   PHURINE 5.0 03/17/2018 2151   GLUCOSEU NEGATIVE 03/17/2018 2151   HGBUR NEGATIVE 03/17/2018 2151   BILIRUBINUR NEGATIVE 03/17/2018 2151   KETONESUR 20 (A) 03/17/2018 2151   PROTEINUR NEGATIVE 03/17/2018 2151   UROBILINOGEN 0.2 09/13/2008 0852   NITRITE NEGATIVE 03/17/2018 2151   LEUKOCYTESUR TRACE (A) 03/17/2018 2151    Radiological Exams on Admission: DG Pelvis 1-2 Views  Result Date: 10/05/2019 CLINICAL DATA:  Right femur deformity EXAM: PELVIS - 1-2 VIEW COMPARISON:  CT 03/19/2018 FINDINGS: Zipper artifact over the right iliac bone. Pubic symphysis appears intact. Both femoral heads project in joint. No fracture is seen. IMPRESSION: Negative.  Electronically Signed   By: Jasmine Pang M.D.   On: 10/05/2019 17:29   DG Chest Port 1 View  Result Date: 10/05/2019 CLINICAL DATA:  Preop fall EXAM: PORTABLE CHEST 1 VIEW COMPARISON:  03/17/2018 FINDINGS: No focal opacity or pleural effusion. Mild cardiomegaly. No pneumothorax. IMPRESSION: No active disease. Mild cardiomegaly. Electronically Signed   By: Jasmine Pang M.D.   On: 10/05/2019 17:26   DG FEMUR, MIN 2 VIEWS RIGHT  Result Date: 10/05/2019 CLINICAL DATA:  Fall, leg deformity EXAM: RIGHT FEMUR 2 VIEWS COMPARISON:  None. FINDINGS: There is an spiral displaced and angulated fracture through the mid shaft of the right femur. No subluxation or dislocation. IMPRESSION: Spiral displaced and angulated mid femoral fracture. Electronically Signed   By: Charlett Nose M.D.   On: 10/05/2019 17:09      Assessment/Plan  Spiral displaced and angulated mid right femoral fracture s/p mechanical fall Orthopedic is aware and will take to the OR in the morning.  Currently getting Buck's traction on right leg. PRN pain management overnight NPO after midnight  will need PT following surgery   AKI Creatinine of 1.05 from prior of 0.56 Continuous IV 100 cc/h fluid Avoid nephrotoxic agent  Leukocytosis No signs of infection.  Suspect could be due to dehydration.  Dementia Patient alert and oriented x3 in the ED.  Although per daughter she sometimes struggles with knowing the year and some current events. Continue donezepil, memantine, trazodone and venlafaxine  Hypertension Continue amlodipine   DVT prophylaxis:.SCDs Code Status: Full Family Communication: Plan discussed with patient and daughter at bedside  disposition Plan: Home with at least 2 midnight stays  Consults called: Ortho Admission status: inpatient  Status is: Inpatient  Remains inpatient appropriate because:Inpatient level of care appropriate due to severity of illness   Dispo: The patient is from: Home               Anticipated d/c is to: Home              Anticipated d/c date is: 3 days  Patient currently is not medically stable to d/c.         Anselm Jungling DO [facility]   If 7PM-7AM, please contact night-coverage [email or web address]   10/06/2019, 12:09 AM

## 2019-10-06 NOTE — Consult Note (Signed)
Orthopaedic Trauma Service (OTS) Consult   Patient ID: Jeanne Lopez Belen MRN: [id number] DOB/AGE: 84/06/1932 84 y.o.   Reason for Consult: Right femoral shaft fracture Referring Physician:  Renne CriglerJoshua Geiple, PA-C (ED), Jeanne BeneJoshua Long, MD (ED Attending)   HPI: Jeanne Lopez Salvatierra is an 86 y.o. female who sustained Lopez ground-level fall yesterday at home while she was walking outside.  The exact mechanism of her fall is really unknown but is believed that she tripped and fell into the flower garden.  Patient had immediate onset of pain in her right leg and thigh was unable to get up and ambulate.  She was brought to the emergency department where she was found to have Lopez displaced right femoral shaft fracture.  Patient was placed into Buck's traction for comfort.  Orthopedic trauma service was consulted for definitive management.  Patient admitted to the medical service.  Patient denies any pain elsewhere.  Denies any numbness or tingling in her lower extremities.  No other specific complaints or concerns.  No chest pain or shortness of breath, no dizziness or lightheadedness.  No recent illnesses.  No nausea or vomiting.  Patient lives with her daughter in Lopez split-level house.  They have about 3 stairs to gain entry into the dwelling.  Patient does not use any assistive devices at baseline.  She is an independent ambulator.  Patient is quite active  She does have Lopez history of having Lopez bone density scan done which was notable for osteoporosis.  She has had Prolia injections in the past however has not had 1 in more than Lopez year due to COVID-19  Daughter also reports that her dementia is progressive at Lopez fairly steady state.  She does have some instances when there are abrupt changes but then she is stable for Lopez while..    Osteoporosis medications      Prolia (but has not received an injection in >1 year)      No vitamin d or calcium by report      No potassium   Past Medical History:  Diagnosis  Date  . Anxiety   . Depression   . Heart murmur   . Insomnia   . Memory disorder 04/02/2017  . Meniere's disease   . Migraine   . Osteoporosis     Past Surgical History:  Procedure Laterality Date  . APPENDECTOMY    . BREAST LUMPECTOMY Right   . DILATION AND CURETTAGE OF UTERUS      Family History  Problem Relation Age of Onset  . Hypertension Brother   . Cancer Sister   . Hypertension Sister   . Hypertension Mother   . Heart attack Mother   . Hypertension Father   . Stroke Father   . Alcohol abuse Daughter   . Depression Daughter   . Drug abuse Daughter   . Alcohol abuse Son   . Depression Son   . Drug abuse Son   . CVA Maternal Grandmother   . Kidney disease Neg Hx   . Hyperlipidemia Neg Hx   . Heart disease Neg Hx   . Early death Neg Hx   . Arthritis Neg Hx     Social History:  reports that she has never smoked. She has never used smokeless tobacco. She reports that she does not drink alcohol and does not use drugs.  Allergies:  Allergies  Allergen Reactions  . Codeine Nausea And Vomiting    Medications: I have reviewed  the patient's current medications. Current Meds  Medication Sig  . amLODipine (NORVASC) 5 MG tablet Take 1 tablet (5 mg total) by mouth daily. (Patient taking differently: Take 5 mg by mouth every evening. )  . docusate sodium (COLACE) 100 MG capsule Take 100 mg by mouth daily.  Marland Kitchen donepezil (ARICEPT) 10 MG tablet Take 1 tablet (10 mg total) by mouth at bedtime.  . gabapentin (NEURONTIN) 100 MG capsule Take 1 capsule (100 mg total) by mouth 2 (two) times daily.  . memantine (NAMENDA) 10 MG tablet Take 1 tablet (10 mg total) by mouth 2 (two) times daily.  . polyethylene glycol (MIRALAX / GLYCOLAX) packet Take 17 g by mouth daily as needed for mild constipation.   . traZODone (DESYREL) 100 MG tablet Take 1 tablet (100 mg total) by mouth at bedtime.  Marland Kitchen venlafaxine XR (EFFEXOR-XR) 150 MG 24 hr capsule Take 150 mg by mouth daily.     Results  for orders placed or performed during the hospital encounter of 10/05/19 (from the past 48 hour(s))  Basic metabolic panel     Status: Abnormal   Collection Time: 10/05/19  5:26 PM  Result Value Ref Range   Sodium 140 135 - 145 mmol/L   Potassium 4.3 3.5 - 5.1 mmol/L   Chloride 105 98 - 111 mmol/L   CO2 24 22 - 32 mmol/L   Glucose, Bld 107 (H) 70 - 99 mg/dL    Comment: Glucose reference range applies only to samples taken after fasting for at least 8 hours.   BUN 19 8 - 23 mg/dL   Creatinine, Ser 9.48 (H) 0.44 - 1.00 mg/dL   Calcium 9.3 8.9 - 54.6 mg/dL   GFR calc non Af Amer 49 (L) >60 mL/min   GFR calc Af Amer 56 (L) >60 mL/min   Anion gap 11 5 - 15    Comment: Performed at [facility], [address], [place], [place] [address]  CBC WITH DIFFERENTIAL     Status: Abnormal   Collection Time: 10/05/19  5:26 PM  Result Value Ref Range   WBC 14.2 (H) 4.0 - 10.5 K/uL   RBC 4.32 3.87 - 5.11 MIL/uL   Hemoglobin 13.1 12.0 - 15.0 g/dL   HCT 00.9 36 - 46 %   MCV 97.5 80.0 - 100.0 fL   MCH 30.3 26.0 - 34.0 pg   MCHC 31.1 30.0 - 36.0 g/dL   RDW 38.1 82.9 - 93.7 %   Platelets 186 150 - 400 K/uL   nRBC 0.0 0.0 - 0.2 %   Neutrophils Relative % 86 %   Neutro Abs 12.3 (H) 1.7 - 7.7 K/uL   Lymphocytes Relative 6 %   Lymphs Abs 0.8 0.7 - 4.0 K/uL   Monocytes Relative 7 %   Monocytes Absolute 1.0 0 - 1 K/uL   Eosinophils Relative 0 %   Eosinophils Absolute 0.1 0 - 0 K/uL   Basophils Relative 0 %   Basophils Absolute 0.1 0 - 0 K/uL   Immature Granulocytes 1 %   Abs Immature Granulocytes 0.07 0.00 - 0.07 K/uL    Comment: Performed at [facility], [address] [place], [place] [address]  Protime-INR     Status: None   Collection Time: 10/05/19  5:26 PM  Result Value Ref Range   Prothrombin Time 12.5 11.4 - 15.2 seconds   INR 1.0 0.8 - 1.2    Comment: (NOTE) INR goal varies based on device and disease  states. Performed at [facility], [address],  [place], [place] [address]   Type and screen [facility]     Status: None   Collection Time: 10/05/19  5:26 PM  Result Value Ref Range   ABO/RH(D) O POS    Antibody Screen NEG    Sample Expiration      10/08/2019,2359 Performed at [facility], [address], [place], [place] [address]   SARS Coronavirus 2 by RT PCR (hospital order, performed in [facility] hospital lab) Nasopharyngeal Nasopharyngeal Swab     Status: None   Collection Time: 10/05/19  5:26 PM   Specimen: Nasopharyngeal Swab  Result Value Ref Range   SARS Coronavirus 2 NEGATIVE NEGATIVE    Comment: (NOTE) SARS-CoV-2 target nucleic acids are NOT DETECTED.  The SARS-CoV-2 RNA is generally detectable in upper and lower respiratory specimens during the acute phase of infection. The lowest concentration of SARS-CoV-2 viral copies this assay can detect is 250 copies / mL. Lopez negative result does not preclude SARS-CoV-2 infection and should not be used as the sole basis for treatment or other patient management decisions.  Lopez negative result may occur with improper specimen collection / handling, submission of specimen other than nasopharyngeal swab, presence of viral mutation(s) within the areas targeted by this assay, and inadequate number of viral copies (<250 copies / mL). Lopez negative result must be combined with clinical observations, patient history, and epidemiological information.  Fact Sheet for Patients:   BoilerBrush.com.cy  Fact Sheet for Healthcare Providers: https://pope.com/  This test is not yet approved or  cleared by the [place] FDA and has been authorized for detection and/or diagnosis of SARS-CoV-2 by FDA under an Emergency Use Authorization (EUA).  This EUA will remain in effect (meaning this test can be used) for the duration of the COVID-19 declaration under Section 564(b)(1) of the Act, 21 U.S.C. section 360bbb-3(b)(1),  unless the authorization is terminated or revoked sooner.  Performed at [facility], [address] [place], [place] [address]   ABO/Rh     Status: None   Collection Time: 10/06/19  2:18 AM  Result Value Ref Range   ABO/RH(D)      O POS Performed at [facility], [address] [place], [place] [address]   Surgical pcr screen     Status: None   Collection Time: 10/06/19  2:52 AM   Specimen: Nasal Mucosa; Nasal Swab  Result Value Ref Range   MRSA, PCR NEGATIVE NEGATIVE   Staphylococcus aureus NEGATIVE NEGATIVE    Comment: (NOTE) The Xpert SA Assay (FDA approved for NASAL specimens in patients 25 years of age and older), is one component of Lopez comprehensive surveillance program. It is not intended to diagnose infection nor to guide or monitor treatment. Performed at [facility], [address] [place], [place] [address]     DG Pelvis 1-2 Views  Result Date: 10/05/2019 CLINICAL DATA:  Right femur deformity EXAM: PELVIS - 1-2 VIEW COMPARISON:  CT 03/19/2018 FINDINGS: Zipper artifact over the right iliac bone. Pubic symphysis appears intact. Both femoral heads project in joint. No fracture is seen. IMPRESSION: Negative. Electronically Signed   By: Jasmine Pang M.D.   On: 10/05/2019 17:29   DG Chest Port 1 View  Result Date: 10/05/2019 CLINICAL DATA:  Preop fall EXAM: PORTABLE CHEST 1 VIEW COMPARISON:  03/17/2018 FINDINGS: No focal opacity or pleural effusion. Mild cardiomegaly. No pneumothorax. IMPRESSION: No  active disease. Mild cardiomegaly. Electronically Signed   By: Jasmine Pang M.D.   On: 10/05/2019 17:26   DG FEMUR, MIN 2 VIEWS RIGHT  Result Date: 10/05/2019 CLINICAL DATA:  Fall, leg deformity EXAM: RIGHT FEMUR 2 VIEWS COMPARISON:  None. FINDINGS: There is an spiral displaced and angulated fracture through the mid shaft of the right femur. No subluxation or dislocation. IMPRESSION: Spiral displaced and angulated mid femoral fracture. Electronically  Signed   By: Charlett Nose M.D.   On: 10/05/2019 17:09    Review of Systems  Constitutional: Negative for chills and fever.  Respiratory: Negative for shortness of breath and wheezing.   Cardiovascular: Negative for chest pain and palpitations.  Gastrointestinal: Negative for abdominal pain, nausea and vomiting.  Genitourinary: Negative for dysuria.  Musculoskeletal:       Right thigh pain and muscle spasms  Neurological: Negative for tingling.  Psychiatric/Behavioral: Positive for memory loss.   Blood pressure 122/71, pulse 60, temperature 98.5 F (36.9 C), temperature source Oral, resp. rate 16, height 5\' 2"  (1.575 m), weight 49.9 kg, SpO2 96 %. Physical Exam Vitals and nursing note reviewed.  Constitutional:      General: She is awake.     Comments: Pleasant female, NAD   HENT:     Head: Normocephalic and atraumatic.  Cardiovascular:     Rate and Rhythm: Normal rate and regular rhythm.  Pulmonary:     Effort: No accessory muscle usage or respiratory distress.     Comments: Unlabored  Chest:     Comments: Symmetric chest rise and fall  Genitourinary:    Comments: Pure wick  Musculoskeletal:     Comments: Pelvis--no traumatic wounds or rash, no ecchymosis, stable to manual stress, nontender  Right Lower Extremity  Inspection:  bucks traction in place to R leg  (10 lbs)  no traumatic wounds   deformity to R mid-thigh  Bony eval:   TTP R thigh     Knee nontender    Lower leg, ankle and foot nontender    No crepitus with palpation of knee, lower leg, ankle or foot  Soft tissue:    Deformity R thigh (muscle spasm and swelling)    No ecchymosis noted to R thigh     Soft tissue of lower leg unremarkable   ROM:    No assessed due to known R femur fracture   Sensation:    DPN, SPN, TN sensation intact   Motor:    EHL, FHL, lesser toe motor intact    Ankle flexion, extension, inversion and eversion intact   Vascular:     Robust DP pulse      Ext warm      Good  perfusion distally      Compartments are soft to thigh and lower leg  Special Test:      Did not assess knee stability due to acute femur fracture   Left Lower extremity              no open wounds or lesions, no swelling or ecchymosis   Nontender hip, knee, ankle and foot             No crepitus or gross motion noted with manipulation of the L leg  No knee or ankle effusion             No pain with axial loading or logrolling of the hip. Negative Stinchfield test   Knee stable to varus/ valgus and anterior/posterior stress  No pain with manipulation of the ankle or foot             No blocks to motion noted  Sens DPN, SPN, TN intact  Motor EHL, FHL, lesser toe motor, Ext, flex, evers 5/5  DP 2+,  No significant edema             Compartments are soft and nontender, no pain with passive stretching  Bilateral upper extremities      shoulder, elbow, wrist, digits- no traumatic wounds, nontender, no instability, no blocks to motion  Sens  Ax/R/M/U intact  Mot   Ax/ R/ PIN/ M/ AIN/ U intact  Rad 2+    Neurological:     Mental Status: She is alert.     Comments: Did not assess coordination or gait as patient has acute right femur fracture  Psychiatric:        Attention and Perception: Attention normal.        Mood and Affect: Mood normal.        Behavior: Behavior is cooperative.        Cognition and Memory: Memory is impaired.     Assessment/Plan:  86 year old female history of osteoporosis ground-level fall with right femoral shaft fracture  -Ground-level fall  -Displaced right femoral shaft fracture  OR today for intramedullary nailing right femur  Weightbearing as tolerated postoperatively  Unrestricted range of motion of right hip and knee postoperatively  PT and OT evaluations  Daughter would like to take patient home upon discharge.  Daughter lives with patient   Arrange for home health services and DME   Daughter concerned that general anesthesia may  exacerbate patient's dementia.  I will discuss with anesthesiology regarding options.  From an orthopedic standpoint anesthesia can utilize whatever means I feel the safest and most effective  - Pain management:  Minimize narcotics  - ABL anemia/Hemodynamics  Stable  Monitor  - Medical issues   Continue with home medications  Delirium precautions while in hospital  - DVT/PE prophylaxis:  Lovenox postoperatively and for 21 days thereafter  - ID:   Perioperative antibiotics  - Metabolic Bone Disease:  Will check vitamin D levels.  Patient does have known history of osteoporosis  Will need an updated bone density scan but would recommend continuation of Prolia once they feel comfortable going to her providers office.  We can also discuss any way home health services can go to home to provide Prolia injection  - Activity:  PT and OT postoperatively  - FEN/GI prophylaxis/Foley/Lines:  N.p.o.  Swallow eval postoperatively given dementia  - Impediments to fracture healing:  Osteoporosis  Dementia  - Dispo:  OR for intramedullary nailing right femur    Mearl Latin, PA-C [phone] (C) 10/06/2019, 9:16 AM  [facility] [address] [place] [address] [phone] Collier Bullock (F)

## 2019-10-06 NOTE — Anesthesia Postprocedure Evaluation (Signed)
Anesthesia Post Note  Patient: Jeanne Lopez  Procedure(s) Performed: INTRAMEDULLARY (IM) NAIL FEMORAL (Right )     Patient location during evaluation: PACU Anesthesia Type: General Level of consciousness: awake and alert Pain management: pain level controlled Vital Signs Assessment: post-procedure vital signs reviewed and stable Respiratory status: spontaneous breathing, nonlabored ventilation, respiratory function stable and patient connected to nasal cannula oxygen Cardiovascular status: blood pressure returned to baseline and stable Postop Assessment: no apparent nausea or vomiting Anesthetic complications: no   No complications documented.  Last Vitals:  Vitals:   10/06/19 1415 10/06/19 1434  BP:  (!) 126/50  Pulse: 66 68  Resp: 18 16  Temp: 36.7 C 36.9 C  SpO2: 98% 100%    Last Pain:  Vitals:   10/06/19 1434  TempSrc: Oral  PainSc:                  Kennieth Rad

## 2019-10-06 NOTE — Plan of Care (Signed)
  Problem: Education: Goal: Knowledge of General Education information will improve Description: Including pain rating scale, medication(s)/side effects and non-pharmacologic comfort measures Outcome: Progressing   Problem: Clinical Measurements: Goal: Ability to maintain clinical measurements within normal limits will improve Outcome: Progressing   

## 2019-10-06 NOTE — Anesthesia Procedure Notes (Signed)
Arterial Line Insertion Start/End8/26/2021 10:25 AM, 10/06/2019 10:32 AM Performed by: Sheppard Evens, CRNA, CRNA  Patient location: Pre-op. Preanesthetic checklist: patient identified, IV checked, site marked, risks and benefits discussed, surgical consent, monitors and equipment checked, pre-op evaluation, timeout performed and anesthesia consent Lidocaine 1% used for infiltration Left, radial was placed Catheter size: 20 G Hand hygiene performed  and maximum sterile barriers used   Attempts: 3 Procedure performed without using ultrasound guided technique. Following insertion, dressing applied and Biopatch. Post procedure assessment: normal  Patient tolerated the procedure well with no immediate complications.

## 2019-10-06 NOTE — Progress Notes (Signed)
Progress Note    Jeanne Lopez  YBO:[id number] DOB: 04-02-1932  DOA: 10/05/2019 PCP: Merri Brunette, MD    Brief Narrative:   Medical records reviewed and are as summarized below:  Jeanne Lopez is an 86 y.o. female with medical history significant for dementia, osteoporosis, hypertension, hyperlipidemia and depression who presents status post mechanical fall.Right femur x-ray significant for spiral displaced and angulated mid femoral fracture.  Assessment/Plan:   Principal Problem:   Closed right femoral fracture (HCC) Active Problems:   AKI (acute kidney injury) (HCC)   Leukocytosis   Dementia without behavioral disturbance (HCC)   HTN (hypertension)   Spiral displaced and angulated mid right femoral fracture s/p mechanical fall -Buck's traction on right leg-OR on 8/26 PRN pain management  -PT Eval in AM  AKI Creatinine of 1.05 from prior of 0.56 monitor  Leukocytosis No signs of infection -trend with labs in AM as no labs ordered for this AM  Dementia Continue donezepil, memantine, trazodone and venlafaxine  Hypertension Continue amlodipine   Family Communication/Anticipated D/C date and plan/Code Status   DVT prophylaxis: per ortho Code Status: Full Code.  Disposition Plan: Status is: Inpatient  Remains inpatient appropriate because:Inpatient level of care appropriate due to severity of illness   Dispo: The patient is from: Home              Anticipated d/c is to: SNF              Anticipated d/c date is: 2 days              Patient currently is not medically stable to d/c.         Medical Consultants:    ortho  Subjective:   Pain controlled currently  Objective:    Vitals:   10/05/19 2215 10/05/19 2329 10/06/19 0300 10/06/19 0900  BP: (!) 119/55 (!) 134/59 122/71 124/68  Pulse: (!) 59 61 60 66  Resp: 11 15 16 17   Temp:  98.5 F (36.9 C) 98.5 F (36.9 C) 98.3 F (36.8 C)  TempSrc:  Oral Oral Oral  SpO2: 99% 100% 96%  99%  Weight:      Height:        Intake/Output Summary (Last 24 hours) at 10/06/2019 1021 Last data filed at 10/06/2019 0900 Gross per 24 hour  Intake 0 ml  Output 200 ml  Net -200 ml   Filed Weights   10/05/19 1549  Weight: 49.9 kg    Exam:  General: Appearance:    Well developed, well nourished female in no acute distress     Lungs:      respirations unlabored  Heart:    Normal heart rate. Normal rhythm.    MS:   All extremities are intact. Right leg in traction  Neurologic:   Awake, alert- pleasant and cooperative    Data Reviewed:   I have personally reviewed following labs and imaging studies:  Labs: Labs show the following:   Basic Metabolic Panel: Recent Labs  Lab 10/05/19 1726  NA 140  K 4.3  CL 105  CO2 24  GLUCOSE 107*  BUN 19  CREATININE 1.05*  CALCIUM 9.3   GFR Estimated Creatinine Clearance: 31.4 mL/min (A) (by C-G formula based on SCr of 1.05 mg/dL (H)). Liver Function Tests: No results for input(s): AST, ALT, ALKPHOS, BILITOT, PROT, ALBUMIN in the last 168 hours. No results for input(s): LIPASE, AMYLASE in the last 168 hours. No results for input(s): AMMONIA in  the last 168 hours. Coagulation profile Recent Labs  Lab 10/05/19 1726  INR 1.0    CBC: Recent Labs  Lab 10/05/19 1726  WBC 14.2*  NEUTROABS 12.3*  HGB 13.1  HCT 42.1  MCV 97.5  PLT 186   Cardiac Enzymes: No results for input(s): CKTOTAL, CKMB, CKMBINDEX, TROPONINI in the last 168 hours. BNP (last 3 results) No results for input(s): PROBNP in the last 8760 hours. CBG: No results for input(s): GLUCAP in the last 168 hours. D-Dimer: No results for input(s): DDIMER in the last 72 hours. Hgb A1c: No results for input(s): HGBA1C in the last 72 hours. Lipid Profile: No results for input(s): CHOL, HDL, LDLCALC, TRIG, CHOLHDL, LDLDIRECT in the last 72 hours. Thyroid function studies: No results for input(s): TSH, T4TOTAL, T3FREE, THYROIDAB in the last 72  hours.  Invalid input(s): FREET3 Anemia work up: No results for input(s): VITAMINB12, FOLATE, FERRITIN, TIBC, IRON, RETICCTPCT in the last 72 hours. Sepsis Labs: Recent Labs  Lab 10/05/19 1726  WBC 14.2*    Microbiology Recent Results (from the past 240 hour(s))  SARS Coronavirus 2 by RT PCR (hospital order, performed in [facility] hospital lab) Nasopharyngeal Nasopharyngeal Swab     Status: None   Collection Time: 10/05/19  5:26 PM   Specimen: Nasopharyngeal Swab  Result Value Ref Range Status   SARS Coronavirus 2 NEGATIVE NEGATIVE Final    Comment: (NOTE) SARS-CoV-2 target nucleic acids are NOT DETECTED.  The SARS-CoV-2 RNA is generally detectable in upper and lower respiratory specimens during the acute phase of infection. The lowest concentration of SARS-CoV-2 viral copies this assay can detect is 250 copies / mL. A negative result does not preclude SARS-CoV-2 infection and should not be used as the sole basis for treatment or other patient management decisions.  A negative result may occur with improper specimen collection / handling, submission of specimen other than nasopharyngeal swab, presence of viral mutation(s) within the areas targeted by this assay, and inadequate number of viral copies (<250 copies / mL). A negative result must be combined with clinical observations, patient history, and epidemiological information.  Fact Sheet for Patients:   BoilerBrush.com.cy  Fact Sheet for Healthcare Providers: https://pope.com/  This test is not yet approved or  cleared by the [place] FDA and has been authorized for detection and/or diagnosis of SARS-CoV-2 by FDA under an Emergency Use Authorization (EUA).  This EUA will remain in effect (meaning this test can be used) for the duration of the COVID-19 declaration under Section 564(b)(1) of the Act, 21 U.S.C. section 360bbb-3(b)(1), unless the authorization is  terminated or revoked sooner.  Performed at [facility], [address], [place], [place] [address]   Surgical pcr screen     Status: None   Collection Time: 10/06/19  2:52 AM   Specimen: Nasal Mucosa; Nasal Swab  Result Value Ref Range Status   MRSA, PCR NEGATIVE NEGATIVE Final   Staphylococcus aureus NEGATIVE NEGATIVE Final    Comment: (NOTE) The Xpert SA Assay (FDA approved for NASAL specimens in patients 50 years of age and older), is one component of a comprehensive surveillance program. It is not intended to diagnose infection nor to guide or monitor treatment. Performed at [facility], [address], [place], [place] [address]     Procedures and diagnostic studies:  DG Pelvis 1-2 Views  Result Date: 10/05/2019 CLINICAL DATA:  Right femur deformity EXAM: PELVIS - 1-2 VIEW COMPARISON:  CT 03/19/2018 FINDINGS: Zipper artifact  over the right iliac bone. Pubic symphysis appears intact. Both femoral heads project in joint. No fracture is seen. IMPRESSION: Negative. Electronically Signed   By: Jasmine Pang M.D.   On: 10/05/2019 17:29   DG Chest Port 1 View  Result Date: 10/05/2019 CLINICAL DATA:  Preop fall EXAM: PORTABLE CHEST 1 VIEW COMPARISON:  03/17/2018 FINDINGS: No focal opacity or pleural effusion. Mild cardiomegaly. No pneumothorax. IMPRESSION: No active disease. Mild cardiomegaly. Electronically Signed   By: Jasmine Pang M.D.   On: 10/05/2019 17:26   DG FEMUR, MIN 2 VIEWS RIGHT  Result Date: 10/05/2019 CLINICAL DATA:  Fall, leg deformity EXAM: RIGHT FEMUR 2 VIEWS COMPARISON:  None. FINDINGS: There is an spiral displaced and angulated fracture through the mid shaft of the right femur. No subluxation or dislocation. IMPRESSION: Spiral displaced and angulated mid femoral fracture. Electronically Signed   By: Charlett Nose M.D.   On: 10/05/2019 17:09    Medications:   . [MAR Hold] amLODipine  5 mg Oral QPM  . [MAR Hold] docusate sodium  100 mg Oral Daily   . [MAR Hold] donepezil  10 mg Oral QHS  . [MAR Hold] gabapentin  100 mg Oral BID  . [MAR Hold] memantine  10 mg Oral BID  . [MAR Hold] traZODone  100 mg Oral QHS  . [MAR Hold] venlafaxine XR  150 mg Oral Daily   Continuous Infusions: . [MAR Hold]  ceFAZolin (ANCEF) IV    . lactated ringers 10 mL/hr at 10/06/19 0958     LOS: 1 day   Joseph Art  [facility]   How to contact the [facility] Attending or Consulting provider 7A - 7P or covering provider during after hours 7P -7A, for this patient?  1. Check the care team in [facility] and look for a) attending/consulting TR[facility] provider listed and b) the [facility] team listed 2. Log into [email or web address] and use [place]'s universal password to access. If you do not have the password, please contact the hospital operator. 3. Locate the [facility] provider you are looking for under [facility] and page to a number that you can be directly reached. 4. If you still have difficulty reaching the provider, please page the [facility] (Director on Call) for the Hospitalists listed on amion for assistance.  10/06/2019, 10:21 AM

## 2019-10-07 ENCOUNTER — Encounter (HOSPITAL_COMMUNITY): Payer: Self-pay | Admitting: Orthopedic Surgery

## 2019-10-07 DIAGNOSIS — D649 Anemia, unspecified: Secondary | ICD-10-CM

## 2019-10-07 LAB — CBC
HCT: 23.2 % — ABNORMAL LOW (ref 36.0–46.0)
Hemoglobin: 7.2 g/dL — ABNORMAL LOW (ref 12.0–15.0)
MCH: 30.5 pg (ref 26.0–34.0)
MCHC: 31 g/dL (ref 30.0–36.0)
MCV: 98.3 fL (ref 80.0–100.0)
Platelets: 116 10*3/uL — ABNORMAL LOW (ref 150–400)
RBC: 2.36 MIL/uL — ABNORMAL LOW (ref 3.87–5.11)
RDW: 15 % (ref 11.5–15.5)
WBC: 8.3 10*3/uL (ref 4.0–10.5)
nRBC: 0 % (ref 0.0–0.2)

## 2019-10-07 LAB — COMPREHENSIVE METABOLIC PANEL
ALT: 13 U/L (ref 0–44)
AST: 31 U/L (ref 15–41)
Albumin: 2.8 g/dL — ABNORMAL LOW (ref 3.5–5.0)
Alkaline Phosphatase: 46 U/L (ref 38–126)
Anion gap: 5 (ref 5–15)
BUN: 24 mg/dL — ABNORMAL HIGH (ref 8–23)
CO2: 25 mmol/L (ref 22–32)
Calcium: 8.3 mg/dL — ABNORMAL LOW (ref 8.9–10.3)
Chloride: 108 mmol/L (ref 98–111)
Creatinine, Ser: 1.09 mg/dL — ABNORMAL HIGH (ref 0.44–1.00)
GFR calc Af Amer: 54 mL/min — ABNORMAL LOW (ref >60)
GFR calc non Af Amer: 47 mL/min — ABNORMAL LOW (ref >60)
Glucose, Bld: 147 mg/dL — ABNORMAL HIGH (ref 70–99)
Potassium: 4.4 mmol/L (ref 3.5–5.1)
Sodium: 138 mmol/L (ref 135–145)
Total Bilirubin: 0.6 mg/dL (ref 0.3–1.2)
Total Protein: 4.9 g/dL — ABNORMAL LOW (ref 6.5–8.1)

## 2019-10-07 LAB — PREALBUMIN: Prealbumin: 9.6 mg/dL — ABNORMAL LOW (ref 18–38)

## 2019-10-07 LAB — PREPARE RBC (CROSSMATCH)

## 2019-10-07 LAB — VITAMIN D 25 HYDROXY (VIT D DEFICIENCY, FRACTURES): Vit D, 25-Hydroxy: 24.62 ng/mL — ABNORMAL LOW (ref 30–100)

## 2019-10-07 MED ORDER — TRAMADOL HCL 50 MG PO TABS
50.0000 mg | ORAL_TABLET | Freq: Three times a day (TID) | ORAL | 0 refills | Status: DC | PRN
Start: 2019-10-07 — End: 2019-10-11

## 2019-10-07 MED ORDER — VITAMIN D 25 MCG (1000 UNIT) PO TABS
2000.0000 [IU] | ORAL_TABLET | Freq: Two times a day (BID) | ORAL | Status: DC
  Administered 2019-10-07 – 2019-10-11 (×9): 2000 [IU] via ORAL
  Filled 2019-10-07 (×10): qty 2

## 2019-10-07 MED ORDER — ADULT MULTIVITAMIN W/MINERALS CH: 1.0000 | ORAL_TABLET | Freq: Every day | ORAL | 2 refills | Status: AC

## 2019-10-07 MED ORDER — ACETAMINOPHEN 500 MG PO TABS: 1000.0000 mg | ORAL_TABLET | Freq: Three times a day (TID) | ORAL | 0 refills | Status: DC

## 2019-10-07 MED ORDER — SODIUM CHLORIDE 0.9% IV SOLUTION: Freq: Once | INTRAVENOUS | Status: AC

## 2019-10-07 MED ORDER — ASCORBIC ACID 500 MG PO TABS
500.0000 mg | ORAL_TABLET | Freq: Every day | ORAL | Status: DC
  Administered 2019-10-07 – 2019-10-11 (×5): 500 mg via ORAL
  Filled 2019-10-07 (×6): qty 1

## 2019-10-07 MED ORDER — SODIUM CHLORIDE 0.9 % IV SOLN: INTRAVENOUS | Status: DC

## 2019-10-07 MED ORDER — DOCUSATE SODIUM 100 MG PO CAPS: 100.0000 mg | ORAL_CAPSULE | Freq: Two times a day (BID) | ORAL | 0 refills | Status: DC

## 2019-10-07 MED ORDER — ASCORBIC ACID 500 MG PO TABS: 500.0000 mg | ORAL_TABLET | Freq: Every day | ORAL | 1 refills | Status: AC

## 2019-10-07 MED ORDER — ENSURE ENLIVE PO LIQD
237.0000 mL | Freq: Three times a day (TID) | ORAL | Status: DC
  Administered 2019-10-07 – 2019-10-11 (×12): 237 mL via ORAL

## 2019-10-07 MED ORDER — ADULT MULTIVITAMIN W/MINERALS CH
1.0000 | ORAL_TABLET | Freq: Every day | ORAL | Status: DC
  Administered 2019-10-07 – 2019-10-11 (×5): 1 via ORAL
  Filled 2019-10-07 (×6): qty 1

## 2019-10-07 MED ORDER — VITAMIN D3 25 MCG PO TABS: 2000.0000 [IU] | ORAL_TABLET | Freq: Two times a day (BID) | ORAL | 3 refills | Status: AC

## 2019-10-07 MED ORDER — ENOXAPARIN SODIUM 40 MG/0.4ML ~~LOC~~ SOLN: 40.0000 mg | SUBCUTANEOUS | 0 refills | Status: DC

## 2019-10-07 NOTE — Plan of Care (Signed)

## 2019-10-07 NOTE — Evaluation (Signed)
Clinical/Bedside Swallow Evaluation Patient Details  Name: Jeanne Lopez MRN: [id number] Date of Birth: [birth year]  Today's Date: 10/07/2019 Time: SLP Start Time (ACUTE ONLY): 1020 SLP Stop Time (ACUTE ONLY): 1040 SLP Time Calculation (min) (ACUTE ONLY): 20 min  Past Medical History:  Past Medical History:  Diagnosis Date  . Anxiety   . Depression   . Heart murmur   . Insomnia   . Memory disorder 04/02/2017  . Meniere's disease   . Migraine   . Osteoporosis    Past Surgical History:  Past Surgical History:  Procedure Laterality Date  . APPENDECTOMY    . BREAST LUMPECTOMY Right   . DILATION AND CURETTAGE OF UTERUS     HPI:  86yo female admitted 10/05/19 after a fall. PMH: dementia, osteoporosis, HTN, HLD, depression. Right Hip surgery 8/26   Assessment / Plan / Recommendation Clinical Impression  Pt seen for bedside swallow evaluation. No history of dysphagia reported. Pt presents with missing dentition, CN exam unremarkable. Pt was given thin liquid, puree, and solid textures. Difficulty noted with self feeding - tremors were present, which daughter indicates was new. Will change diet to Dys 3 (mech soft) and recommend chopped solids. Thin liquids, meds whole. Safe swallow precautions posted at [facility]. SLP will follow briefly for diet tolerance and education.   SLP Visit Diagnosis: Dysphagia, unspecified (R13.10)    Aspiration Risk  Mild aspiration risk    Diet Recommendation Dysphagia 3 (Mech soft);Thin liquid   Liquid Administration via: Cup;Straw Medication Administration: Whole meds with liquid Supervision: Staff to assist with self feeding;Patient able to self feed;Full supervision/cueing for compensatory strategies Compensations: Minimize environmental distractions;Slow rate;Small sips/bites Postural Changes: Seated upright at 90 degrees;Remain upright for at least 30 minutes after po intake    Other  Recommendations Oral Care Recommendations: Oral care BID   Follow  up Recommendations 24 hour supervision/assistance      Frequency and Duration min 1 x/week  1 week;2 weeks       Prognosis Prognosis for Safe Diet Advancement: Good      Swallow Study   General Date of Onset: 10/05/19 HPI: 86yo female admitted 10/05/19 after a fall. PMH: dementia, osteoporosis, HTN, HLD, depression. Right Hip surgery 8/26 Type of Study: Bedside Swallow Evaluation Previous Swallow Assessment: none Diet Prior to this Study: Regular;Thin liquids Temperature Spikes Noted: No History of Recent Intubation: Yes Length of Intubations (days): 1 days Date extubated: 10/06/19 (intubated for surgery) Behavior/Cognition: Alert;Cooperative;Pleasant mood;Distractible;Requires cueing Oral Cavity Assessment: Within Functional Limits Oral Care Completed by SLP: No Oral Cavity - Dentition: Missing dentition Vision: Functional for self-feeding Self-Feeding Abilities: Able to feed self;Needs assist;Needs set up Patient Positioning: Upright in bed Baseline Vocal Quality: Normal Volitional Cough: Strong Volitional Swallow: Able to elicit    Oral/Motor/Sensory Function Overall Oral Motor/Sensory Function: Within functional limits   Ice Chips Ice chips: Not tested   Thin Liquid Thin Liquid: Within functional limits Presentation: Self Fed;Straw    Nectar Thick Nectar Thick Liquid: Not tested   Honey Thick Honey Thick Liquid: Not tested   Puree Puree: Within functional limits Presentation: Spoon;Self Fed   Solid     Solid: Within functional limits Presentation: Self Fed     Selena Swaminathan B. Murvin Natal, [facility], CCC-SLP Speech Language Pathologist Office: [phone] Pager: [phone]  Leigh Aurora 10/07/2019,10:44 AM

## 2019-10-07 NOTE — Plan of Care (Signed)

## 2019-10-07 NOTE — Progress Notes (Signed)
Initial Nutrition Assessment  DOCUMENTATION CODES:   Not applicable  INTERVENTION:   Ensure Enlive po TID, each supplement provides 350 kcal and 20 grams of protein.  Continue MVI with minerals daily.  Continue vitamin D supplementation for low vitamin D level.  NUTRITION DIAGNOSIS:   Increased nutrient needs related to hip fracture as evidenced by estimated needs.  GOAL:   Patient will meet greater than or equal to 90% of their needs  MONITOR:   PO intake, Supplement acceptance  REASON FOR ASSESSMENT:   Consult Hip fracture protocol  ASSESSMENT:    86 yo female admitted with R femur fracture S/P mechanical fall at home. S/P repair 8/26. PMH includes dementia, osteoporosis, HTN, HLD, depression.   S/P bedside swallow evaluation with SLP today. Diet downgraded slightly to dysphagia 3 with thin liquids. Patient was noted to have tremors causing difficulty with self-feeding. Meal intakes not recorded. Suspect intake will be poor given dementia.  Labs reviewed. Vitamin D, 25-hydroxy 24.62 (L), prealbumin 9.6 (L) Medications reviewed and include vitamin C, vitamin D3, Colace. IVF: LR at 10 ml/h  Usual weights reviewed. Patient has lost 7% of her usual weight within the past 4 months. BMI 20.12 is below the desirable range for her age.   NUTRITION - FOCUSED PHYSICAL EXAM:  unable to complete, RD working remotely  Diet Order:   Diet Order            DIET DYS 3 Room service appropriate? Yes with Assist; Fluid consistency: Thin  Diet effective now                 EDUCATION NEEDS:   Not appropriate for education at this time  Skin:  Skin Assessment: Reviewed RN Assessment (surgical incision to R leg)  Last BM:  8/25  Height:   Ht Readings from Last 1 Encounters:  10/05/19 5\' 2"  (1.575 m)    Weight:   Wt Readings from Last 1 Encounters:  10/05/19 49.9 kg    Ideal Body Weight:  50 kg  BMI:  Body mass index is 20.12 kg/m.  Estimated  Nutritional Needs:   Kcal:  1400-1600  Protein:  70-80 gm  Fluid:  >/= 1.5 L    10/07/19, RD, LDN, CNSC Please refer to Amion for contact information.

## 2019-10-07 NOTE — Evaluation (Signed)
Occupational Therapy Evaluation Patient Details Name: Jeanne Lopez MRN: [id number] DOB: 06/02/1932 Today's Date: 10/07/2019    History of Present Illness Jeanne Lopez is an 86 y.o. female with medical history significant for dementia, osteoporosis, hypertension, hyperlipidemia and depression who presents status post mechanical fall.Right femur x-ray significant for spiral displaced and angulated mid femoral fracture. S/p repair on 8/26 with IM nail.   Clinical Impression   Patient presents with confusion and difficulty processing WBS,  RLE pain, TDWB status, generalized weakness, lethargy, declines to ADL and functional mobility compared to PLOF.  PLOF: patient with noted baseline cognitive impairment, she did not drive, but was otherwise Ind with self care, mobility w/o a device, and assisted her daughter with home management and meal prep.  Currently: she is Mod A x2 for basic transfers, is near Dep care of LB ADL, Mod A for UB ADL and near Dep for toileting.  Patient is not able to follow TDWB w/o max VC's and physical assist.  Daughter is hoping she can transition home with needed DME; however, patient is appropriate for SNF prior to returning home with daughter given level of assist, daughter's home set up and current WBS.         Follow Up Recommendations  SNF    Equipment Recommendations  If home: wheelchair 18x18 with 2" gel/foam cushion and ELR's.  Drop arm BSC, ? hoter lift vs slide board, 2WRW, and HH OT/PT and HH aide.  SNF is OT's recommendation.      Recommendations for Other Services       Precautions / Restrictions Precautions Precautions: Fall Restrictions Weight Bearing Restrictions: Yes RLE Weight Bearing: Touchdown weight bearing      Mobility Bed Mobility Overal bed mobility: Needs Assistance Bed Mobility: Sit to Supine     Supine to sit: Max assist;+2 for physical assistance     General bed mobility comments: pt assisting with cues to scoot over using  L LE, assist to raise trunk as pt attempting then returned to supine stating, "I can't do it"  Transfers Overall transfer level: Needs assistance Equipment used: None Transfers: Sit to/from [employer] Transfers Sit to Stand: Mod assist;+2 physical assistance Stand pivot transfers: Mod assist;+2 physical assistance       General transfer comment: initiating liftoff to remove soiled brief and pad, then up to stand on Stedy with some lifting help and cues for TDWB on R then sat on seat to move to recliner    Balance Overall balance assessment: Needs assistance Sitting-balance support: Feet supported Sitting balance-Leahy Scale: Fair     Standing balance support: Bilateral upper extremity supported Standing balance-Leahy Scale: Poor                             ADL either performed or assessed with clinical judgement   ADL Overall ADL's : Needs assistance/impaired Eating/Feeding: Set up       Upper Body Bathing: Moderate assistance   Lower Body Bathing: Total assistance   Upper Body Dressing : Minimal assistance   Lower Body Dressing: Total assistance Lower Body Dressing Details (indicate cue type and reason): VC's for TDWB Toilet Transfer: Moderate assistance;+2 for physical assistance;Squat-pivot             General ADL Comments: limited by TDWB, pain and decreased processing     Vision Baseline Vision/History: Wears glasses       Perception     Praxis  Pertinent Vitals/Pain Pain Assessment: Faces Faces Pain Scale: Hurts whole lot Pain Location: R hip with movement Pain Descriptors / Indicators: Guarding;Grimacing Pain Intervention(s): Limited activity within patient's tolerance;Repositioned     Hand Dominance     Extremity/Trunk Assessment Upper Extremity Assessment Upper Extremity Assessment: Generalized weakness   Lower Extremity Assessment Lower Extremity Assessment: Defer to PT evaluation RLE Deficits / Details: limited  AAROM due to pain with knee/hip flexion moves in bed with assist and grimaces so full motion NT   Cervical / Trunk Assessment Cervical / Trunk Assessment: Normal   Communication Communication Communication: No difficulties   Cognition Arousal/Alertness: Awake/alert Behavior During Therapy: Restless Overall Cognitive Status: History of cognitive impairments - at baseline                                 General Comments: Assists daughter with cooking and cleaning at home.  Does not drive.     General Comments  R LE swelling noted post surgery    Exercises     Shoulder Instructions      Home Living Family/patient expects to be discharged to:: Private residence Living Arrangements: Children Available Help at Discharge: Family Type of Home: House Home Access: Stairs to enter     Home Layout: Multi-level Alternate Level Stairs-Number of Steps: 3 stairs to upstairs bedroom   Bathroom Shower/Tub: Chief Strategy Officer: Standard Bathroom Accessibility: Yes How Accessible: Accessible via walker Home Equipment: None          Prior Functioning/Environment Level of Independence: Independent        Comments: Patient has cognitive impairments, but could be left alone for short times.          OT Problem List: Decreased strength;Decreased range of motion;Decreased activity tolerance;Impaired balance (sitting and/or standing);Decreased cognition;Decreased safety awareness;Decreased knowledge of use of DME or AE;Decreased knowledge of precautions;Pain      OT Treatment/Interventions: Self-care/ADL training;Therapeutic exercise;Therapeutic activities;Patient/family education;Balance training    OT Goals(Current goals can be found in the care plan section) Acute Rehab OT Goals Patient Stated Goal: Daughter hoping patient can transfer OT Goal Formulation: With family Potential to Achieve Goals: Fair ADL Goals Pt Will Perform Grooming: with  supervision;sitting Pt Will Perform Upper Body Bathing: with set-up;with supervision;with min guard assist;with min assist;sitting Pt Will Perform Upper Body Dressing: with set-up;with supervision;with min guard assist;sitting Pt Will Transfer to Toilet: with set-up;with supervision;with mod assist;bedside commode;with transfer board Pt/caregiver will Perform Home Exercise Program: Increased strength;Both right and left upper extremity;With theraband;With Supervision Additional ADL Goal #1: Patient will maintain TDWB to RLE with min VC's during transfers.  OT Frequency: Min 2X/week   Barriers to D/C: Inaccessible home environment;Decreased caregiver support          Co-evaluation              AM-PAC OT "6 Clicks" Daily Activity     Outcome Measure Help from another person eating meals?: A Little Help from another person taking care of personal grooming?: A Lot Help from another person toileting, which includes using toliet, bedpan, or urinal?: A Lot Help from another person bathing (including washing, rinsing, drying)?: A Lot Help from another person to put on and taking off regular upper body clothing?: A Lot   6 Click Score: 11   End of Session    Activity Tolerance:   Patient left:    OT Visit Diagnosis: Unsteadiness on feet (R26.81);Muscle  weakness (generalized) (M62.81);Pain Pain - Right/Left: Right Pain - part of body: Hip                Time: 1333-1400 OT Time Calculation (min): 27 min Charges:  OT General Charges $OT Visit: 1 Visit OT Evaluation $OT Eval Moderate Complexity: 1 Mod  Calloway Andrus D Winthrop Shannahan, OTR/L 10/07/2019, 2:48 PM

## 2019-10-07 NOTE — Evaluation (Signed)
Physical Therapy Evaluation Patient Details Name: Jeanne Lopez MRN: [id number] DOB: 1932-06-23 Today's Date: 10/07/2019   History of Present Illness  Jeanne Lopez is an 86 y.o. female with medical history significant for dementia, osteoporosis, hypertension, hyperlipidemia and depression who presents status post mechanical fall.Right femur x-ray significant for spiral displaced and angulated mid femoral fracture. S/p repair on 8/26 with IM nail.  Clinical Impression  Patient presents with decreased mobility due to deficits listed in PT problem list.  Currently  Needing +2 mod A for supine to sit & sit to stand and total A to move to chair in [place].  She is will benefit from continued skilled PT in the acute setting and follow up SNF level rehab at d/c.     Follow Up Recommendations SNF    Equipment Recommendations  None recommended by PT    Recommendations for Other Services       Precautions / Restrictions Precautions Precautions: Fall Restrictions Weight Bearing Restrictions: Yes RLE Weight Bearing: Touchdown weight bearing      Mobility  Bed Mobility Overal bed mobility: Needs Assistance Bed Mobility: Supine to Sit     Supine to sit: HOB elevated;Mod assist;+2 for physical assistance     General bed mobility comments: pt assisting with cues to scoot over using L LE, assist to raise trunk as pt attempting then returned to supine stating, "I can't do it"  Transfers Overall transfer level: Needs assistance Equipment used: Ambulation equipment used Transfers: Sit to/from [employer] Transfers Sit to Stand: Mod assist;+2 physical assistance Stand pivot transfers: Total assist;+2 physical assistance       General transfer comment: initiating liftoff to remove soiled brief and pad, then up to stand on [place] with some lifting help and cues for TDWB on R then sat on seat to move to recliner  Ambulation/Gait                Stairs             Wheelchair Mobility    Modified Rankin (Stroke Patients Only)       Balance Overall balance assessment: Needs assistance Sitting-balance support: Feet supported Sitting balance-Leahy Scale: Fair     Standing balance support: Bilateral upper extremity supported Standing balance-Leahy Scale: Poor                               Pertinent Vitals/Pain Pain Assessment: Faces Faces Pain Scale: Hurts even more Pain Location: R hip with movement Pain Descriptors / Indicators: Guarding;Grimacing Pain Intervention(s): Monitored during session;Repositioned;Limited activity within patient's tolerance    Home Living Family/patient expects to be discharged to:: Skilled nursing facility Living Arrangements: Children Available Help at Discharge: Family Type of Home: House Home Access: Stairs to enter     Home Layout: Multi-level Home Equipment: None      Prior Function Level of Independence: Independent         Comments: 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, worked in garden, Psychiatric nurse        Extremity/Trunk Assessment   Upper Extremity Assessment Upper Extremity Assessment: Defer to OT evaluation    Lower Extremity Assessment Lower Extremity Assessment: RLE deficits/detail RLE Deficits / Details: limited AAROM due to pain with knee/hip flexion moves in bed with assist and grimaces so full motion NT       Communication   Communication: No difficulties  Cognition Arousal/Alertness: Awake/alert Behavior  During Therapy: Flat affect Overall Cognitive Status: History of cognitive impairments - at baseline                                        General Comments General comments (skin integrity, edema, etc.): daughter present throughout and supportive    Exercises     Assessment/Plan    PT Assessment Patient needs continued PT services  PT Problem List Decreased strength;Decreased mobility;Pain;Decreased knowledge of  use of DME;Decreased balance;Decreased activity tolerance;Decreased knowledge of precautions       PT Treatment Interventions DME instruction;Therapeutic activities;Therapeutic exercise;Patient/family education;Balance training;Functional mobility training;Gait training    PT Goals (Current goals can be found in the Care Plan section)  Acute Rehab PT Goals Patient Stated Goal: None stated PT Goal Formulation: With patient/family Time For Goal Achievement: 10/21/19 Potential to Achieve Goals: Fair    Frequency Min 3X/week   Barriers to discharge        Co-evaluation               AM-PAC PT "6 Clicks" Mobility  Outcome Measure Help needed turning from your back to your side while in a flat bed without using bedrails?: A Lot Help needed moving from lying on your back to sitting on the side of a flat bed without using bedrails?: A Lot Help needed moving to and from a bed to a chair (including a wheelchair)?: Total Help needed standing up from a chair using your arms (e.g., wheelchair or bedside chair)?: A Lot Help needed to walk in hospital room?: Total Help needed climbing 3-5 steps with a railing? : Total 6 Click Score: 9    End of Session   Activity Tolerance: Patient limited by pain Patient left: in chair;with call bell/phone within reach;with family/visitor present Nurse Communication: Need for lift equipment;Mobility status PT Visit Diagnosis: History of falling (Z91.81);Difficulty in walking, not elsewhere classified (R26.2);Pain Pain - Right/Left: Right Pain - part of body: Hip    Time: 2426-8341 PT Time Calculation (min) (ACUTE ONLY): 25 min   Charges:   PT Evaluation $PT Eval Moderate Complexity: 1 Mod PT Treatments $Therapeutic Activity: 8-22 mins        Sheran Lawless, PT Acute Rehabilitation Services Pager:[phone] Office:[phone] 10/07/2019   Elray Mcgregor 10/07/2019, 1:13 PM

## 2019-10-07 NOTE — NC FL2 (Signed)
[place] MEDICAID FL2 LEVEL OF CARE SCREENING TOOL     IDENTIFICATION  Patient Name: Jeanne Lopez Birthdate: 1933-01-25 Sex: female Admission Date (Current Location): 10/05/2019  [facility] and [place] Number:  Producer, television/film/video and Address:  [facility] [place], [address] [place], [place] [address]      Provider Number: [id number]  Attending Physician Name and Address:  Joseph Art, DO  Relative Name and Phone Number:       Current Level of Care: Hospital Recommended Level of Care: Skilled Nursing Facility Prior Approval Number:    Date Approved/Denied:   PASRR Number: pending  Discharge Plan: SNF    Current Diagnoses: Patient Active Problem List   Diagnosis Date Noted  . Anemia 10/07/2019  . AKI (acute kidney injury) (HCC) 10/06/2019  . Leukocytosis 10/06/2019  . Dementia without behavioral disturbance (HCC) 10/06/2019  . HTN (hypertension) 10/06/2019  . Closed right femoral fracture (HCC) 10/05/2019  . Sepsis (HCC) 03/19/2018  . Febrile illness 03/18/2018  . Memory disorder 04/02/2017  . Chronic constipation 08/11/2014  . Hyperlipidemia with target LDL less than 130 07/12/2014  . Migraine without aura and without status migrainosus, not intractable 07/12/2014  . Other constipation 06/06/2014  . Systolic murmur 04/03/2014  . Dementia arising in the senium and presenium (HCC) 08/16/2013  . Depression with anxiety 08/16/2013  . Osteoporosis 08/11/2013    Orientation RESPIRATION BLADDER Height & Weight     Self  Normal Incontinent Weight: 110 lb (49.9 kg) Height:  5\' 2"  (157.5 cm)  BEHAVIORAL SYMPTOMS/MOOD NEUROLOGICAL BOWEL NUTRITION STATUS      Incontinent Diet (See discharge summary)  AMBULATORY STATUS COMMUNICATION OF NEEDS Skin   Extensive Assist Verbally Surgical wounds                       Personal Care Assistance Level of Assistance  Bathing, Feeding, Dressing Bathing Assistance: Maximum assistance Feeding  assistance: Limited assistance Dressing Assistance: Maximum assistance     Functional Limitations Info  Speech, Hearing, Sight Sight Info: Adequate Hearing Info: Adequate Speech Info: Adequate    SPECIAL CARE FACTORS FREQUENCY                       Contractures Contractures Info: Not present    Additional Factors Info  Code Status, Allergies, Insulin Sliding Scale Code Status Info: Full Allergies Info: codeine   Insulin Sliding Scale Info: lovonox       Current Medications (10/07/2019):  This is the current hospital active medication list Current Facility-Administered Medications  Medication Dose Route Frequency Provider Last Rate Last Admin  . acetaminophen (TYLENOL) tablet 1,000 mg  1,000 mg Oral TID 10/09/2019, PA-C   1,000 mg at 10/07/19 0859  . acetaminophen (TYLENOL) tablet 325-650 mg  325-650 mg Oral Q6H PRN 10/09/19, PA-C      . ascorbic acid (VITAMIN C) tablet 500 mg  500 mg Oral Daily Montez Morita, PA-C   500 mg at 10/07/19 1321  . cholecalciferol (VITAMIN D3) tablet 2,000 Units  2,000 Units Oral BID 10/09/19, PA-C   2,000 Units at 10/07/19 1321  . docusate sodium (COLACE) capsule 100 mg  100 mg Oral BID 10/09/19, PA-C   100 mg at 10/07/19 0859  . donepezil (ARICEPT) tablet 10 mg  10 mg Oral QHS 10/09/19, PA-C   10 mg at 10/06/19 2241  . enoxaparin (LOVENOX) injection 40 mg  40 mg Subcutaneous  Q24H Montez Morita, PA-C   40 mg at 10/07/19 0900  . feeding supplement (ENSURE ENLIVE) (ENSURE ENLIVE) liquid 237 mL  237 mL Oral TID BM Vann, Jessica U, DO   237 mL at 10/07/19 1422  . gabapentin (NEURONTIN) capsule 100 mg  100 mg Oral BID Montez Morita, PA-C   100 mg at 10/07/19 0900  . HYDROcodone-acetaminophen (NORCO/VICODIN) 5-325 MG per tablet 1 tablet  1 tablet Oral Q6H PRN Montez Morita, PA-C      . lactated ringers infusion   Intravenous Continuous Montez Morita, PA-C 10 mL/hr at 10/06/19 1037 New Bag at 10/06/19 1220  . memantine (NAMENDA) tablet 10 mg  10  mg Oral BID Montez Morita, PA-C   10 mg at 10/07/19 0859  . methocarbamol (ROBAXIN) tablet 500 mg  500 mg Oral Q12H PRN Montez Morita, PA-C      . metoCLOPramide (REGLAN) tablet 5-10 mg  5-10 mg Oral Q8H PRN Montez Morita, PA-C       Or  . metoCLOPramide (REGLAN) injection 5-10 mg  5-10 mg Intravenous Q8H PRN Montez Morita, PA-C      . multivitamin with minerals tablet 1 tablet  1 tablet Oral Daily Montez Morita, PA-C   1 tablet at 10/07/19 1321  . ondansetron (ZOFRAN) injection 4 mg  4 mg Intravenous Q6H PRN Montez Morita, PA-C      . ondansetron [facility]) tablet 4 mg  4 mg Oral Q6H PRN Montez Morita, PA-C       Or  . ondansetron [facility]) injection 4 mg  4 mg Intravenous Q6H PRN Montez Morita, PA-C      . polyethylene glycol (MIRALAX / GLYCOLAX) packet 17 g  17 g Oral Daily PRN Montez Morita, PA-C      . traMADol Janean Sark) tablet 50 mg  50 mg Oral Q8H PRN Montez Morita, PA-C      . traZODone (DESYREL) tablet 100 mg  100 mg Oral QHS Montez Morita, PA-C   100 mg at 10/06/19 2242  . venlafaxine XR (EFFEXOR-XR) 24 hr capsule 150 mg  150 mg Oral Daily Montez Morita, PA-C   150 mg at 10/07/19 3785     Discharge Medications: Please see discharge summary for a list of discharge medications.  Relevant Imaging Results:  Relevant Lab Results:   Additional Information [id number]  Jimmy Picket, [place]

## 2019-10-07 NOTE — Progress Notes (Signed)
Orthopaedic Trauma Service Progress Note  Patient ID: Jeanne Lopez MRN: [id number] DOB/AGE: 84-30-1934 84 y.o.  Subjective:  Agitated over night by report but calm now Daughter at bedside  Review of Systems  Unable to perform ROS: Dementia    Objective:   VITALS:   Vitals:   10/06/19 2030 10/07/19 0051 10/07/19 0448 10/07/19 0848  BP: (!) 103/58 (!) 97/44 (!) 91/40 (!) 104/45  Pulse: (!) 59 64 94 64  Resp: 15 15 15 17   Temp: 98.4 F (36.9 C) 98.9 F (37.2 C) 98.7 F (37.1 C) 98.8 F (37.1 C)  TempSrc: Oral Oral Oral Oral  SpO2: 97% 100% 94% 96%  Weight:      Height:        Estimated body mass index is 20.12 kg/m as calculated from the following:   Height as of this encounter: 5\' 2"  (1.575 m).   Weight as of this encounter: 49.9 kg.   Intake/Output      08/26 0701 - 08/27 0700 08/27 0701 - 08/28 0700   P.O. 0    I.V. (mL/kg) 1000 (20)    IV Piggyback 450    Total Intake(mL/kg) 1450 (29.1)    Urine (mL/kg/hr) 250 (0.2)    Blood 100    Total Output 350    Net +1100         Urine Occurrence 5 x      LABS  Results for orders placed or performed during the hospital encounter of 10/05/19 (from the past 24 hour(s))  VITAMIN D 25 Hydroxy (Vit-D Deficiency, Fractures)     Status: Abnormal   Collection Time: 10/07/19  3:14 AM  Result Value Ref Range   Vit D, 25-Hydroxy 24.62 (L) 30 - 100 ng/mL  Comprehensive metabolic panel     Status: Abnormal   Collection Time: 10/07/19  3:14 AM  Result Value Ref Range   Sodium 138 135 - 145 mmol/L   Potassium 4.4 3.5 - 5.1 mmol/L   Chloride 108 98 - 111 mmol/L   CO2 25 22 - 32 mmol/L   Glucose, Bld 147 (H) 70 - 99 mg/dL   BUN 24 (H) 8 - 23 mg/dL   Creatinine, Ser 10/09/19 (H) 0.44 - 1.00 mg/dL   Calcium 8.3 (L) 8.9 - 10.3 mg/dL   Total Protein 4.9 (L) 6.5 - 8.1 g/dL   Albumin 2.8 (L) 3.5 - 5.0 g/dL   AST 31 15 - 41 U/L   ALT 13 0 - 44 U/L    Alkaline Phosphatase 46 38 - 126 U/L   Total Bilirubin 0.6 0.3 - 1.2 mg/dL   GFR calc non Af Amer 47 (L) >60 mL/min   GFR calc Af Amer 54 (L) >60 mL/min   Anion gap 5 5 - 15  Prealbumin     Status: Abnormal   Collection Time: 10/07/19  3:14 AM  Result Value Ref Range   Prealbumin 9.6 (L) 18 - 38 mg/dL     PHYSICAL EXAM:  Gen: resting comfortably in bed, NAD  Lungs: unlabored Ext:       Right Lower Extremity   Dressings c/d/i  Ext warm   No real swelling distal of note  Moves toes and ankle without difficulty   Sensation grossly intact  No DCT     Assessment/Plan: 1 Day  Post-Op   Principal Problem:   Closed right femoral fracture (HCC) Active Problems:   AKI (acute kidney injury) (HCC)   Leukocytosis   Dementia without behavioral disturbance (HCC)   HTN (hypertension)   Anti-infectives (From admission, onward)   Start     Dose/Rate Route Frequency Ordered Stop   10/06/19 1700  ceFAZolin (ANCEF) IVPB 1 g/50 mL premix        1 g 100 mL/hr over 30 Minutes Intravenous Every 6 hours 10/06/19 1438 10/07/19 0506   10/06/19 1000  ceFAZolin (ANCEF) IVPB 2g/100 mL premix        2 g 200 mL/hr over 30 Minutes Intravenous  Once 10/06/19 0934 10/06/19 1049    .  POD/HD#: 1  86 y/o s/p fall with R femoral shaft fracture   -fall  - R femoral shaft fracture s/p IMN  TDWB R leg  Unrestricted ROM R hip and knee  PT/OT  Ice and elevate for swelling and pain   Dressing changes as needed starting tomorrow     Daughter really wants to take pt home and not got to a SNF  - Pain management:  minimze narcotics  Scheduled tylenol   - ABL anemia/Hemodynamics  CBC pending for this am   - Medical issues   Per primary   - DVT/PE prophylaxis:  Lovenox x 21 days  - ID:   periop abx  - Metabolic Bone Disease:  Vitamin d insufficiency    Supplement   Mechanism suggestive of poor bone quality. This was confirmed clinically in OR    Recommend DEXA in 4-8 weeks  -  Activity:  As above  Up with assistance  - FEN/GI prophylaxis/Foley/Lines:  ST eval due to dementia    Follow recs  - Impediments to fracture healing:  Poor bone quality   Vitamin d insufficiency   - Dispo:  Ortho issues stable  Therapy evals  Follow up with ortho in 10-14 days   Weightbearing: TDWB LLE Insicional and dressing care: Daily dressing changes with mepilex or similar starting on 10/08/2019 Orthopedic device(s): walker Showering: ok to shower once wounds are dry, see dc instruction for wound care VTE prophylaxis: Lovenox 40mg  qd 21 days Pain control: multimodal: tylenol, PRN tramadol  Bone Health/Optimization: vitamin d supplementation, DEXA  Follow - up plan: 10-14 days Contact information:  MD, Myrene Galas PA-C    Montez Morita, PA-C [phone] (C) 10/07/2019, 9:49 AM  [facility] [address] [place] [phone] (F)

## 2019-10-07 NOTE — Discharge Instructions (Signed)
Orthopaedic Trauma Service Discharge Instructions   General Discharge Instructions  Orthopaedic Injuries:  Right femur fracture treated with intramedullary nailing  WEIGHT BEARING STATUS: Touchdown weightbearing right leg, use walker for assistance  RANGE OF MOTION/ACTIVITY: Unrestricted range of motion right hip and knee  Bone health: Labs show vitamin D insufficiency.  Fracture also suggestive of poor bone quality.  Take vitamin D supplements that have been prescribed for you and continue to eat a diet with adequate protein and fat.  You will need a bone density scan in the next 4 to 8 weeks to further evaluate your bone health  Wound Care: Daily wound care starting on 10/09/2019.  Please see below.  Once wounds are dry you may leave them open to the air.  Discharge Wound Care Instructions  Do NOT apply any ointments, solutions or lotions to pin sites or surgical wounds.  These prevent needed drainage and even though solutions like hydrogen peroxide kill bacteria, they also damage cells lining the pin sites that help fight infection.  Applying lotions or ointments can keep the wounds moist and can cause them to breakdown and open up as well. This can increase the risk for infection. When in doubt call the office.  Surgical incisions should be dressed daily.  If any drainage is noted, use one layer of adaptic, then gauze, and tape.  Alternatively you can use a Mepilex type dressing.  These are the beige/brown dressings that were on at the hospital  Once the incision is completely dry and without drainage, it may be left open to air out.  Showering may begin 36-48 hours later.  Cleaning gently with soap and water.   DVT/PE prophylaxis: Lovenox injection daily x 21 days  Diet: as you were eating previously.  Can use over the counter stool softeners and bowel preparations, such as Miralax, to help with bowel movements.  Narcotics can be constipating.  Be sure to drink plenty of  fluids  PAIN MEDICATION USE AND EXPECTATIONS  You have likely been given narcotic medications to help control your pain.  After a traumatic event that results in an fracture (broken bone) with or without surgery, it is ok to use narcotic pain medications to help control one's pain.  We understand that everyone responds to pain differently and each individual patient will be evaluated on a regular basis for the continued need for narcotic medications. Ideally, narcotic medication use should last no more than 6-8 weeks (coinciding with fracture healing).   As a patient it is your responsibility as well to monitor narcotic medication use and report the amount and frequency you use these medications when you come to your office visit.   We would also advise that if you are using narcotic medications, you should take a dose prior to therapy to maximize you participation.  IF YOU ARE ON NARCOTIC MEDICATIONS IT IS NOT PERMISSIBLE TO OPERATE A MOTOR VEHICLE (MOTORCYCLE/CAR/TRUCK/MOPED) OR HEAVY MACHINERY DO NOT MIX NARCOTICS WITH OTHER CNS (CENTRAL NERVOUS SYSTEM) DEPRESSANTS SUCH AS ALCOHOL   STOP SMOKING OR USING NICOTINE PRODUCTS!!!!  As discussed nicotine severely impairs your body's ability to heal surgical and traumatic wounds but also impairs bone healing.  Wounds and bone heal by forming microscopic blood vessels (angiogenesis) and nicotine is a vasoconstrictor (essentially, shrinks blood vessels).  Therefore, if vasoconstriction occurs to these microscopic blood vessels they essentially disappear and are unable to deliver necessary nutrients to the healing tissue.  This is one modifiable factor that you can do  to dramatically increase your chances of healing your injury.    (This means no smoking, no nicotine gum, patches, etc)  DO NOT USE NONSTEROIDAL ANTI-INFLAMMATORY DRUGS (NSAID'S)  Using products such as Advil (ibuprofen), Aleve (naproxen), Motrin (ibuprofen) for additional pain control during  fracture healing can delay and/or prevent the healing response.  If you would like to take over the counter (OTC) medication, Tylenol (acetaminophen) is ok.  However, some narcotic medications that are given for pain control contain acetaminophen as well. Therefore, you should not exceed more than 4000 mg of tylenol in a day if you do not have liver disease.  Also note that there are may OTC medicines, such as cold medicines and allergy medicines that my contain tylenol as well.  If you have any questions about medications and/or interactions please ask your doctor/PA or your pharmacist.      ICE AND ELEVATE INJURED/OPERATIVE EXTREMITY  Using ice and elevating the injured extremity above your heart can help with swelling and pain control.  Icing in a pulsatile fashion, such as 20 minutes on and 20 minutes off, can be followed.    Do not place ice directly on skin. Make sure there is a barrier between to skin and the ice pack.    Using frozen items such as frozen peas works well as the conform nicely to the are that needs to be iced.  USE AN ACE WRAP OR TED HOSE FOR SWELLING CONTROL  In addition to icing and elevation, Ace wraps or TED hose are used to help limit and resolve swelling.  It is recommended to use Ace wraps or TED hose until you are informed to stop.    When using Ace Wraps start the wrapping distally (farthest away from the body) and wrap proximally (closer to the body)   Example: If you had surgery on your leg or thing and you do not have a splint on, start the ace wrap at the toes and work your way up to the thigh        If you had surgery on your upper extremity and do not have a splint on, start the ace wrap at your fingers and work your way up to the upper arm  IF YOU ARE IN A SPLINT OR CAST DO NOT [place]   If your splint gets wet for any reason please contact the office immediately. You may shower in your splint or cast as long as you keep it dry.  This can be done  by wrapping in a cast cover or garbage back (or similar)  Do Not stick any thing down your splint or cast such as pencils, money, or hangers to try and scratch yourself with.  If you feel itchy take benadryl as prescribed on the bottle for itching  IF YOU ARE IN A CAM BOOT (BLACK BOOT)  You may remove boot periodically. Perform daily dressing changes as noted below.  Wash the liner of the boot regularly and wear a sock when wearing the boot. It is recommended that you sleep in the boot until told otherwise    Call office for the following:  Temperature greater than 101F  Persistent nausea and vomiting  Severe uncontrolled pain  Redness, tenderness, or signs of infection (pain, swelling, redness, odor or green/yellow discharge around the site)  Difficulty breathing, headache or visual disturbances  Hives  Persistent dizziness or light-headedness  Extreme fatigue  Any other questions or concerns you may have after  discharge  In an emergency, call 911 or go to an Emergency Department at a nearby hospital   CALL THE OFFICE WITH ANY QUESTIONS OR CONCERNS: [phone]   VISIT OUR WEBSITE FOR ADDITIONAL INFORMATION: [email or web address]

## 2019-10-07 NOTE — TOC CAGE-AID Note (Signed)
Transition of Care [facility]) - CAGE-AID Screening   Patient Details  Name: Jeanne Lopez MRN: [id number] Date of Birth: 1932/11/30  [facility]) CM/SW Contact:    Jeanne Lopez, [place] Phone Number: 10/07/2019, 10:03 AM   Clinical Narrative:  Pt was unable to participate in assessment due to only being oriented to self and having dementia.   CAGE-AID Screening: Substance Abuse Screening unable to be completed due to: : Patient unable to participate            Jeanne Lopez Clinical Social Worker [phone]

## 2019-10-07 NOTE — Progress Notes (Signed)
Progress Note    Jeanne MassedLila A Portillo  ZOX:[id number] DOB: 12/22/1932  DOA: 10/05/2019 PCP: Merri BrunetteSmith, Candace, MD    Brief Narrative:   Medical records reviewed and are as summarized below:  Jeanne Lopez is an 86 y.o. female with medical history significant for dementia, osteoporosis, hypertension, hyperlipidemia and depression who presents status post mechanical fall.Right femur x-ray significant for spiral displaced and angulated mid femoral fracture. S/p repair on 8/26.  Assessment/Plan:   Principal Problem:   Closed right femoral fracture (HCC) Active Problems:   AKI (acute kidney injury) (HCC)   Leukocytosis   Dementia without behavioral disturbance (HCC)   HTN (hypertension)   Spiral displaced and angulated mid right femoral fracture s/p mechanical fall -OR on 8/26 PRN pain management  -PT Eval in AM  ABLA -expected post-op -due to hypotension, will give 1 unit PRBCs  AKI Creatinine of 1.05 from prior of 0.56 monitor  Leukocytosis -resolved  Dementia Continue donezepil, memantine, trazodone and venlafaxine  Hypertension now hypotensive -d/c amlodipine   Family Communication/Anticipated D/C date and plan/Code Status   DVT prophylaxis: per ortho Code Status: Full Code.  Disposition Plan: Status is: Inpatient  Remains inpatient appropriate because:Inpatient level of care appropriate due to severity of illness   Dispo: The patient is from: Home              Anticipated d/c is to: SNF              Anticipated d/c date is: 2 days              Patient currently is not medically stable to d/c.         Medical Consultants:    ortho  Subjective:   No current complaints Nursing reports patient was trying to get out of bed earlier   Objective:    Vitals:   10/06/19 2030 10/07/19 0051 10/07/19 0448 10/07/19 0848  BP: (!) 103/58 (!) 97/44 (!) 91/40 (!) 104/45  Pulse: (!) 59 64 94 64  Resp: 15 15 15 17   Temp: 98.4 F (36.9 C) 98.9 F (37.2  C) 98.7 F (37.1 C) 98.8 F (37.1 C)  TempSrc: Oral Oral Oral Oral  SpO2: 97% 100% 94% 96%  Weight:      Height:        Intake/Output Summary (Last 24 hours) at 10/07/2019 0954 Last data filed at 10/07/2019 0700 Gross per 24 hour  Intake 1450.03 ml  Output 350 ml  Net 1100.03 ml   Filed Weights   10/05/19 1549  Weight: 49.9 kg    Exam:  General: Appearance:    Well developed, well nourished female in no acute distress     Lungs:     respirations unlabored  Heart:    Normal heart rate. Normal rhythm.    MS:   All extremities are intact.        Data Reviewed:   I have personally reviewed following labs and imaging studies:  Labs: Labs show the following:   Basic Metabolic Panel: Recent Labs  Lab 10/05/19 1726 10/07/19 0314  NA 140 138  K 4.3 4.4  CL 105 108  CO2 24 25  GLUCOSE 107* 147*  BUN 19 24*  CREATININE 1.05* 1.09*  CALCIUM 9.3 8.3*   GFR Estimated Creatinine Clearance: 30.3 mL/min (A) (by C-G formula based on SCr of 1.09 mg/dL (H)). Liver Function Tests: Recent Labs  Lab 10/07/19 0314  AST 31  ALT 13  ALKPHOS 46  BILITOT 0.6  PROT 4.9*  ALBUMIN 2.8*   No results for input(s): LIPASE, AMYLASE in the last 168 hours. No results for input(s): AMMONIA in the last 168 hours. Coagulation profile Recent Labs  Lab 10/05/19 1726  INR 1.0    CBC: Recent Labs  Lab 10/05/19 1726 10/07/19 0648  WBC 14.2* 8.3  NEUTROABS 12.3*  --   HGB 13.1 7.2*  HCT 42.1 23.2*  MCV 97.5 98.3  PLT 186 116*   Cardiac Enzymes: No results for input(s): CKTOTAL, CKMB, CKMBINDEX, TROPONINI in the last 168 hours. BNP (last 3 results) No results for input(s): PROBNP in the last 8760 hours. CBG: No results for input(s): GLUCAP in the last 168 hours. D-Dimer: No results for input(s): DDIMER in the last 72 hours. Hgb A1c: No results for input(s): HGBA1C in the last 72 hours. Lipid Profile: No results for input(s): CHOL, HDL, LDLCALC, TRIG, CHOLHDL,  LDLDIRECT in the last 72 hours. Thyroid function studies: No results for input(s): TSH, T4TOTAL, T3FREE, THYROIDAB in the last 72 hours.  Invalid input(s): FREET3 Anemia work up: No results for input(s): VITAMINB12, FOLATE, FERRITIN, TIBC, IRON, RETICCTPCT in the last 72 hours. Sepsis Labs: Recent Labs  Lab 10/05/19 1726 10/07/19 0648  WBC 14.2* 8.3    Microbiology Recent Results (from the past 240 hour(s))  SARS Coronavirus 2 by RT PCR (hospital order, performed in [facility] hospital lab) Nasopharyngeal Nasopharyngeal Swab     Status: None   Collection Time: 10/05/19  5:26 PM   Specimen: Nasopharyngeal Swab  Result Value Ref Range Status   SARS Coronavirus 2 NEGATIVE NEGATIVE Final    Comment: (NOTE) SARS-CoV-2 target nucleic acids are NOT DETECTED.  The SARS-CoV-2 RNA is generally detectable in upper and lower respiratory specimens during the acute phase of infection. The lowest concentration of SARS-CoV-2 viral copies this assay can detect is 250 copies / mL. A negative result does not preclude SARS-CoV-2 infection and should not be used as the sole basis for treatment or other patient management decisions.  A negative result may occur with improper specimen collection / handling, submission of specimen other than nasopharyngeal swab, presence of viral mutation(s) within the areas targeted by this assay, and inadequate number of viral copies (<250 copies / mL). A negative result must be combined with clinical observations, patient history, and epidemiological information.  Fact Sheet for Patients:   BoilerBrush.com.cy  Fact Sheet for Healthcare Providers: https://pope.com/  This test is not yet approved or  cleared by the [place] FDA and has been authorized for detection and/or diagnosis of SARS-CoV-2 by FDA under an Emergency Use Authorization (EUA).  This EUA will remain in effect (meaning this test can be used)  for the duration of the COVID-19 declaration under Section 564(b)(1) of the Act, 21 U.S.C. section 360bbb-3(b)(1), unless the authorization is terminated or revoked sooner.  Performed at [facility], [address] [place], [place] [address]   Surgical pcr screen     Status: None   Collection Time: 10/06/19  2:52 AM   Specimen: Nasal Mucosa; Nasal Swab  Result Value Ref Range Status   MRSA, PCR NEGATIVE NEGATIVE Final   Staphylococcus aureus NEGATIVE NEGATIVE Final    Comment: (NOTE) The Xpert SA Assay (FDA approved for NASAL specimens in patients 31 years of age and older), is one component of a comprehensive surveillance program. It is not intended to diagnose infection nor to guide or monitor treatment. Performed at [facility], [address] [place], [place] [address]     Procedures and diagnostic studies:  DG Pelvis 1-2 Views  Result Date: 10/05/2019 CLINICAL DATA:  Right femur deformity EXAM: PELVIS - 1-2 VIEW COMPARISON:  CT 03/19/2018 FINDINGS: Zipper artifact over the right iliac bone. Pubic symphysis appears intact. Both femoral heads project in joint. No fracture is seen. IMPRESSION: Negative. Electronically Signed   By: Jasmine Pang M.D.   On: 10/05/2019 17:29   DG Chest Port 1 View  Result Date: 10/05/2019 CLINICAL DATA:  Preop fall EXAM: PORTABLE CHEST 1 VIEW COMPARISON:  03/17/2018 FINDINGS: No focal opacity or pleural effusion. Mild cardiomegaly. No pneumothorax. IMPRESSION: No active disease. Mild cardiomegaly. Electronically Signed   By: Jasmine Pang M.D.   On: 10/05/2019 17:26   DG C-Arm 1-60 Min  Result Date: 10/06/2019 CLINICAL DATA:  Open reduction internal fixation for fracture EXAM: RIGHT FEMUR 2 VIEWS; DG C-ARM 1-60 MIN COMPARISON:  Right femur radiographs October 05, 2019 FLUOROSCOPY TIME:  2 minutes 27 seconds; 16.60 mGy; 6 acquired images. FINDINGS: Frontal and lateral views show rod fixation through a comminuted fracture of the mid  femur. There are fixating screws proximally with screw tips in the femoral head region. At the fracture site, there is still mild displacement of fracture fragments but less displacement than on preoperative study. No new fracture. No dislocation. There is mild narrowing of the right hip joint. IMPRESSION: Fixation through fracture of the mid femur with alignment near anatomic. No new fracture. No dislocation. Fixation screws in proximal femoral head. Moderate narrowing right hip joint. Electronically Signed   By: Bretta Bang III M.D.   On: 10/06/2019 13:02   DG FEMUR, MIN 2 VIEWS RIGHT  Result Date: 10/06/2019 CLINICAL DATA:  Status post ORIF of right femoral fracture EXAM: RIGHT FEMUR 2 VIEWS COMPARISON:  10/05/2019 as well as intraoperative films from earlier in the same day. FINDINGS: Multiple fracture lines are noted within the midportion of the right femur. Medullary rod with fixation screws both proximally and distally has been placed. No new focal abnormality is noted. IMPRESSION: Status post ORIF of complicated right femoral fracture. Electronically Signed   By: Alcide Clever M.D.   On: 10/06/2019 17:23   DG FEMUR, MIN 2 VIEWS RIGHT  Result Date: 10/06/2019 CLINICAL DATA:  Open reduction internal fixation for fracture EXAM: RIGHT FEMUR 2 VIEWS; DG C-ARM 1-60 MIN COMPARISON:  Right femur radiographs October 05, 2019 FLUOROSCOPY TIME:  2 minutes 27 seconds; 16.60 mGy; 6 acquired images. FINDINGS: Frontal and lateral views show rod fixation through a comminuted fracture of the mid femur. There are fixating screws proximally with screw tips in the femoral head region. At the fracture site, there is still mild displacement of fracture fragments but less displacement than on preoperative study. No new fracture. No dislocation. There is mild narrowing of the right hip joint. IMPRESSION: Fixation through fracture of the mid femur with alignment near anatomic. No new fracture. No dislocation. Fixation  screws in proximal femoral head. Moderate narrowing right hip joint. Electronically Signed   By: Bretta Bang III M.D.   On: 10/06/2019 13:02   DG FEMUR, MIN 2 VIEWS RIGHT  Result Date: 10/05/2019 CLINICAL DATA:  Fall, leg deformity EXAM: RIGHT FEMUR 2 VIEWS COMPARISON:  None. FINDINGS: There is an spiral displaced and angulated fracture through the mid shaft of the right femur. No subluxation or dislocation. IMPRESSION: Spiral displaced and angulated mid femoral fracture. Electronically Signed   By: Charlett Nose M.D.  On: 10/05/2019 17:09    Medications:   . acetaminophen  1,000 mg Oral TID  . amLODipine  5 mg Oral QPM  . docusate sodium  100 mg Oral BID  . donepezil  10 mg Oral QHS  . enoxaparin (LOVENOX) injection  40 mg Subcutaneous Q24H  . gabapentin  100 mg Oral BID  . memantine  10 mg Oral BID  . traZODone  100 mg Oral QHS  . venlafaxine XR  150 mg Oral Daily   Continuous Infusions: . sodium chloride    . lactated ringers 10 mL/hr at 10/06/19 1037     LOS: 2 days   Joseph Art  [facility]   How to contact the [facility] Attending or Consulting provider 7A - 7P or covering provider during after hours 7P -7A, for this patient?  1. Check the care team in [facility] and look for a) attending/consulting TRH provider listed and b) the [facility] team listed 2. Log into [email or web address] and use [facility]'s universal password to access. If you do not have the password, please contact the hospital operator. 3. Locate the [facility] provider you are looking for under [facility] and page to a number that you can be directly reached. 4. If you still have difficulty reaching the provider, please page the [facility] (Director on Call) for the Hospitalists listed on amion for assistance.  10/07/2019, 9:54 AM

## 2019-10-07 NOTE — Plan of Care (Signed)

## 2019-10-08 DIAGNOSIS — I10 Essential (primary) hypertension: Secondary | ICD-10-CM

## 2019-10-08 LAB — CBC
HCT: 27.1 % — ABNORMAL LOW (ref 36.0–46.0)
Hemoglobin: 8.6 g/dL — ABNORMAL LOW (ref 12.0–15.0)
MCH: 29.4 pg (ref 26.0–34.0)
MCHC: 31.7 g/dL (ref 30.0–36.0)
MCV: 92.5 fL (ref 80.0–100.0)
Platelets: 101 10*3/uL — ABNORMAL LOW (ref 150–400)
RBC: 2.93 MIL/uL — ABNORMAL LOW (ref 3.87–5.11)
RDW: 19.5 % — ABNORMAL HIGH (ref 11.5–15.5)
WBC: 7.5 10*3/uL (ref 4.0–10.5)
nRBC: 0 % (ref 0.0–0.2)

## 2019-10-08 LAB — TYPE AND SCREEN
ABO/RH(D): O POS
Antibody Screen: NEGATIVE
Unit division: 0

## 2019-10-08 LAB — BASIC METABOLIC PANEL
Anion gap: 5 (ref 5–15)
BUN: 21 mg/dL (ref 8–23)
CO2: 27 mmol/L (ref 22–32)
Calcium: 8.7 mg/dL — ABNORMAL LOW (ref 8.9–10.3)
Chloride: 110 mmol/L (ref 98–111)
Creatinine, Ser: 0.93 mg/dL (ref 0.44–1.00)
GFR calc Af Amer: >60 mL/min (ref >60)
GFR calc non Af Amer: 56 mL/min — ABNORMAL LOW (ref >60)
Glucose, Bld: 99 mg/dL (ref 70–99)
Potassium: 3.9 mmol/L (ref 3.5–5.1)
Sodium: 142 mmol/L (ref 135–145)

## 2019-10-08 LAB — BPAM RBC
Blood Product Expiration Date: 202109262359
ISSUE DATE / TIME: 202108271124
Unit Type and Rh: 5100

## 2019-10-08 MED ORDER — OXYCODONE HCL 5 MG PO TABS: 5.0000 mg | ORAL_TABLET | ORAL | Status: DC | PRN

## 2019-10-08 MED ORDER — TRAMADOL HCL 50 MG PO TABS: 50.0000 mg | ORAL_TABLET | Freq: Three times a day (TID) | ORAL | Status: DC | PRN

## 2019-10-08 NOTE — Progress Notes (Signed)
Progress Note    Jeanne Lopez  UMP:[id number] DOB: 1932-09-28  DOA: 10/05/2019 PCP: Merri Brunette, MD    Brief Narrative:   Medical records reviewed and are as summarized below:  Jeanne Lopez is an 86 y.o. female with medical history significant for dementia, osteoporosis, hypertension, hyperlipidemia and depression who presents status post mechanical fall.Right femur x-ray significant for spiral displaced and angulated mid femoral fracture. S/p repair on 8/26.  Assessment/Plan:   Principal Problem:   Closed right femoral fracture (HCC) Active Problems:   AKI (acute kidney injury) (HCC)   Leukocytosis   Dementia without behavioral disturbance (HCC)   HTN (hypertension)   Anemia   Spiral displaced and angulated mid right femoral fracture s/p mechanical fall -OR on 8/26 PRN pain management  -PT Eval recommends SNF but mentions daughter taking her home  ABLA -expected post-op -due to hypotension s/p 1 unit PRBCs  AKI -stable  Thrombocytopenia -monitor while on lovenox -AM labs  Leukocytosis -resolved  Dementia Continue donezepil, memantine, trazodone and venlafaxine  Hypertension with episode of hypotensive -d/c amlodipine   Family Communication/Anticipated D/C date and plan/Code Status   DVT prophylaxis: lovenox Code Status: Full Code.  Disposition Plan: Status is: Inpatient No family at bedside  Remains inpatient appropriate because:Inpatient level of care appropriate due to severity of illness   Dispo: The patient is from: Home              Anticipated d/c is to: SNFvs home with family              Anticipated d/c date is: when safe d/c plan arranged              Patient currently is  medically stable to d/c.         Medical Consultants:    ortho  Subjective:   Per nurse less confused overnight last night C.o pain in her hip  Objective:    Vitals:   10/07/19 1333 10/07/19 1800 10/07/19 1947 10/08/19 0410  BP: (!)  106/49 (!) 102/56 (!) 104/47 (!) 120/55  Pulse: 69 60 66 63  Resp: 16  16 16   Temp: 98.6 F (37 C) 98.5 F (36.9 C) 98.5 F (36.9 C) 98 F (36.7 C)  TempSrc: Oral Oral Oral Oral  SpO2: 98% 94% 93%   Weight:      Height:        Intake/Output Summary (Last 24 hours) at 10/08/2019 0851 Last data filed at 10/08/2019 0500 Gross per 24 hour  Intake 523.83 ml  Output 300 ml  Net 223.83 ml   Filed Weights   10/05/19 1549  Weight: 49.9 kg    Exam:   General: Appearance:    Well developed, well nourished female in no acute distress     Lungs:     respirations unlabored  Heart:    Normal heart rate.   MS:   All extremities are intact.   Neurologic:   Will awaken and answer questions    Data Reviewed:   I have personally reviewed following labs and imaging studies:  Labs: Labs show the following:   Basic Metabolic Panel: Recent Labs  Lab 10/05/19 1726 10/05/19 1726 10/07/19 0314 10/08/19 0334  NA 140  --  138 142  K 4.3   < > 4.4 3.9  CL 105  --  108 110  CO2 24  --  25 27  GLUCOSE 107*  --  147* 99  BUN 19  --  24* 21  CREATININE 1.05*  --  1.09* 0.93  CALCIUM 9.3  --  8.3* 8.7*   < > = values in this interval not displayed.   GFR Estimated Creatinine Clearance: 35.5 mL/min (by C-G formula based on SCr of 0.93 mg/dL). Liver Function Tests: Recent Labs  Lab 10/07/19 0314  AST 31  ALT 13  ALKPHOS 46  BILITOT 0.6  PROT 4.9*  ALBUMIN 2.8*   No results for input(s): LIPASE, AMYLASE in the last 168 hours. No results for input(s): AMMONIA in the last 168 hours. Coagulation profile Recent Labs  Lab 10/05/19 1726  INR 1.0    CBC: Recent Labs  Lab 10/05/19 1726 10/07/19 0648 10/08/19 0334  WBC 14.2* 8.3 7.5  NEUTROABS 12.3*  --   --   HGB 13.1 7.2* 8.6*  HCT 42.1 23.2* 27.1*  MCV 97.5 98.3 92.5  PLT 186 116* 101*   Cardiac Enzymes: No results for input(s): CKTOTAL, CKMB, CKMBINDEX, TROPONINI in the last 168 hours. BNP (last 3 results) No  results for input(s): PROBNP in the last 8760 hours. CBG: No results for input(s): GLUCAP in the last 168 hours. D-Dimer: No results for input(s): DDIMER in the last 72 hours. Hgb A1c: No results for input(s): HGBA1C in the last 72 hours. Lipid Profile: No results for input(s): CHOL, HDL, LDLCALC, TRIG, CHOLHDL, LDLDIRECT in the last 72 hours. Thyroid function studies: No results for input(s): TSH, T4TOTAL, T3FREE, THYROIDAB in the last 72 hours.  Invalid input(s): FREET3 Anemia work up: No results for input(s): VITAMINB12, FOLATE, FERRITIN, TIBC, IRON, RETICCTPCT in the last 72 hours. Sepsis Labs: Recent Labs  Lab 10/05/19 1726 10/07/19 0648 10/08/19 0334  WBC 14.2* 8.3 7.5    Microbiology Recent Results (from the past 240 hour(s))  SARS Coronavirus 2 by RT PCR (hospital order, performed in [facility] hospital lab) Nasopharyngeal Nasopharyngeal Swab     Status: None   Collection Time: 10/05/19  5:26 PM   Specimen: Nasopharyngeal Swab  Result Value Ref Range Status   SARS Coronavirus 2 NEGATIVE NEGATIVE Final    Comment: (NOTE) SARS-CoV-2 target nucleic acids are NOT DETECTED.  The SARS-CoV-2 RNA is generally detectable in upper and lower respiratory specimens during the acute phase of infection. The lowest concentration of SARS-CoV-2 viral copies this assay can detect is 250 copies / mL. A negative result does not preclude SARS-CoV-2 infection and should not be used as the sole basis for treatment or other patient management decisions.  A negative result may occur with improper specimen collection / handling, submission of specimen other than nasopharyngeal swab, presence of viral mutation(s) within the areas targeted by this assay, and inadequate number of viral copies (<250 copies / mL). A negative result must be combined with clinical observations, patient history, and epidemiological information.  Fact Sheet for Patients:     BoilerBrush.com.cy  Fact Sheet for Healthcare Providers: https://pope.com/  This test is not yet approved or  cleared by the [place] FDA and has been authorized for detection and/or diagnosis of SARS-CoV-2 by FDA under an Emergency Use Authorization (EUA).  This EUA will remain in effect (meaning this test can be used) for the duration of the COVID-19 declaration under Section 564(b)(1) of the Act, 21 U.S.C. section 360bbb-3(b)(1), unless the authorization is terminated or revoked sooner.  Performed at [facility], [address], [place], [place] [address]   Surgical pcr screen     Status: None   Collection Time: 10/06/19  2:52  AM   Specimen: Nasal Mucosa; Nasal Swab  Result Value Ref Range Status   MRSA, PCR NEGATIVE NEGATIVE Final   Staphylococcus aureus NEGATIVE NEGATIVE Final    Comment: (NOTE) The Xpert SA Assay (FDA approved for NASAL specimens in patients 81 years of age and older), is one component of a comprehensive surveillance program. It is not intended to diagnose infection nor to guide or monitor treatment. Performed at [facility], [address], [place], [place] [address]     Procedures and diagnostic studies:  DG C-Arm 1-60 Min  Result Date: 10/06/2019 CLINICAL DATA:  Open reduction internal fixation for fracture EXAM: RIGHT FEMUR 2 VIEWS; DG C-ARM 1-60 MIN COMPARISON:  Right femur radiographs October 05, 2019 FLUOROSCOPY TIME:  2 minutes 27 seconds; 16.60 mGy; 6 acquired images. FINDINGS: Frontal and lateral views show rod fixation through a comminuted fracture of the mid femur. There are fixating screws proximally with screw tips in the femoral head region. At the fracture site, there is still mild displacement of fracture fragments but less displacement than on preoperative study. No new fracture. No dislocation. There is mild narrowing of the right hip joint. IMPRESSION: Fixation through  fracture of the mid femur with alignment near anatomic. No new fracture. No dislocation. Fixation screws in proximal femoral head. Moderate narrowing right hip joint. Electronically Signed   By: Bretta Bang III M.D.   On: 10/06/2019 13:02   DG FEMUR, MIN 2 VIEWS RIGHT  Result Date: 10/06/2019 CLINICAL DATA:  Status post ORIF of right femoral fracture EXAM: RIGHT FEMUR 2 VIEWS COMPARISON:  10/05/2019 as well as intraoperative films from earlier in the same day. FINDINGS: Multiple fracture lines are noted within the midportion of the right femur. Medullary rod with fixation screws both proximally and distally has been placed. No new focal abnormality is noted. IMPRESSION: Status post ORIF of complicated right femoral fracture. Electronically Signed   By: Alcide Clever M.D.   On: 10/06/2019 17:23   DG FEMUR, MIN 2 VIEWS RIGHT  Result Date: 10/06/2019 CLINICAL DATA:  Open reduction internal fixation for fracture EXAM: RIGHT FEMUR 2 VIEWS; DG C-ARM 1-60 MIN COMPARISON:  Right femur radiographs October 05, 2019 FLUOROSCOPY TIME:  2 minutes 27 seconds; 16.60 mGy; 6 acquired images. FINDINGS: Frontal and lateral views show rod fixation through a comminuted fracture of the mid femur. There are fixating screws proximally with screw tips in the femoral head region. At the fracture site, there is still mild displacement of fracture fragments but less displacement than on preoperative study. No new fracture. No dislocation. There is mild narrowing of the right hip joint. IMPRESSION: Fixation through fracture of the mid femur with alignment near anatomic. No new fracture. No dislocation. Fixation screws in proximal femoral head. Moderate narrowing right hip joint. Electronically Signed   By: Bretta Bang III M.D.   On: 10/06/2019 13:02    Medications:   . acetaminophen  1,000 mg Oral TID  . vitamin C  500 mg Oral Daily  . cholecalciferol  2,000 Units Oral BID  . docusate sodium  100 mg Oral BID  .  donepezil  10 mg Oral QHS  . enoxaparin (LOVENOX) injection  40 mg Subcutaneous Q24H  . feeding supplement (ENSURE ENLIVE)  237 mL Oral TID BM  . gabapentin  100 mg Oral BID  . memantine  10 mg Oral BID  . multivitamin with minerals  1 tablet Oral Daily  . traZODone  100 mg Oral QHS  .  venlafaxine XR  150 mg Oral Daily   Continuous Infusions: . lactated ringers 10 mL/hr at 10/06/19 1037     LOS: 3 days   Joseph ArtJessica U Vicktoria Muckey  [facility]   How to contact the [facility] Attending or Consulting provider 7A - 7P or covering provider during after hours 7P -7A, for this patient?  1. Check the care [facility] in [facility] and look for a) attending/consulting TRH provider listed and b) the [facility] team listed 2. Log into [email or web address] and use [place]'s universal password to access. If you do not have the password, please contact the hospital operator. 3. Locate the [facility] provider you are looking for under [facility] and page to a number that you can be directly reached. 4. If you still have difficulty reaching the provider, please page the [facility] (Director on Call) for the Hospitalists listed on amion for assistance.  10/08/2019, 8:51 AM

## 2019-10-08 NOTE — Social Work (Signed)
RE:  Cookie Pore   Date of Birth: 15-Apr-1932    Date:  10/08/2019      To Whom It May Concern:  Please be advised that the above-named patient will require a short-term nursing home stay - anticipated 30 days or less for rehabilitation and strengthening.  The plan is for return home.

## 2019-10-08 NOTE — Progress Notes (Signed)
   ORTHOPAEDIC PROGRESS NOTE  s/p Procedure(s): INTRAMEDULLARY (IM) NAIL FEMORAL  SUBJECTIVE: Reports mild pain about operative site. No chest pain. No SOB. No nausea/vomiting. No other complaints.  OBJECTIVE: PE:  Vitals:   10/07/19 1947 10/08/19 0410  BP: (!) 104/47 (!) 120/55  Pulse: 66 63  Resp: 16 16  Temp: 98.5 F (36.9 C) 98 F (36.7 C)  SpO2: 93%    Patient oriented to self not to place or situation.  Was sleeping on entry but aroused easily.  Wounds dressed with mepelex some drainage on distal dressings.    ASSESSMENT: Jeanne Lopez is a 86 y.o. female doing well postoperatively.  PLAN: Weightbearing: NWB RLE Insicional and dressing care: Dressings left intact until follow-up will change in am Orthopedic device(s): None Showering: not yet VTE prophylaxis: Lovenox 40mg  qd  Pain control: well controlled Follow - up plan: will see in am Contact information:    Patient ID: , female   DOB: [birth year], 86 y.o.   MRN: [id number]

## 2019-10-08 NOTE — Plan of Care (Signed)

## 2019-10-08 NOTE — TOC Initial Note (Signed)
Transition of Care [facility]) - Initial/Assessment Note    Patient Details  Name: Jeanne Lopez MRN: [id number] Date of Birth: 1932/05/08  Transition [facility]) CM/SW Contact:    Emeterio Reeve, [place] Phone Number: 10/08/2019, 11:46 AM  Clinical Narrative:                  CSW met with pt at bedside. CSW introduced self and explained her role at the hospital.  CSW spoke to pts son Clair Gulling and daughter Manuela Schwartz by phone. Clair Gulling stated that PTA pt was living with her daughter Manuela Schwartz. Pt lives in a split level home with bed room on the 2nd floor. Pt was independent with mobility but needed prompts due to her dementia.   CSW discussed pt/ot reccs of SNF. Son and daughter agreed that would be best for the pt. Manuela Schwartz inquired about visitation for snfs/ CSW encouraged the family to call the facilities to find out what there policies are. Son stated that be is familiar with [facility] and gave csw permission to fax out to facilities in the area.   CSW completed FL2 and faxed out to facilitties. Pts Passr is pending. Clinicals need to be uploaded.   Expected Discharge Plan: Skilled Nursing Facility Barriers to Discharge: Continued Medical Work up   Patient Goals and CMS Choice Patient states their goals for this hospitalization and ongoing recovery are:: Family states to get better and return home after snf CMS Medicare.gov Compare Post Acute Care list provided to:: Patient Choice offered to / list presented to : Patient, Adult Children  Expected Discharge Plan and Services Expected Discharge Plan: [place]       Living arrangements for the past 2 months: Single Family Home                                      Prior Living Arrangements/Services Living arrangements for the past 2 months: Single Family Home Lives with:: Adult Children Patient language and need for interpreter reviewed:: Yes Do you feel safe going back to the place where you live?: Yes      Need for Family  Participation in Patient Care: Yes (Comment) Care giver support system in place?: Yes (comment)   Criminal Activity/Legal Involvement Pertinent to Current Situation/Hospitalization: No - Comment as needed  Activities of Daily Living Home Assistive Devices/Equipment: None ADL Screening (condition at time of admission) Patient's cognitive ability adequate to safely complete daily activities?: Yes Is the patient deaf or have difficulty hearing?: No Does the patient have difficulty seeing, even when wearing glasses/contacts?: No Does the patient have difficulty concentrating, remembering, or making decisions?: No Patient able to express need for assistance with ADLs?: Yes Does the patient have difficulty dressing or bathing?: Yes Independently performs ADLs?: Yes (appropriate for developmental age) Does the patient have difficulty walking or climbing stairs?: Yes Weakness of Legs: Right Weakness of Arms/Hands: None  Permission Sought/Granted Permission sought to share information with : Facility Sport and exercise psychologist, Family Supports    Share Information with NAME: Manuela Schwartz and Clair Gulling  Permission granted to share info w AGENCY: SNF  Permission granted to share info w Relationship: Children     Emotional Assessment Appearance:: Appears stated age Attitude/Demeanor/Rapport: Unable to Assess Affect (typically observed): Unable to Assess Orientation: : Oriented to Self Alcohol / Substance Use: Not Applicable Psych Involvement: No (comment)  Admission diagnosis:  Fracture [T14.8XXA] Closed right femoral fracture (  [place]) [S72.91XA] Closed displaced spiral fracture of shaft of right femur, initial encounter ([place]) [S72.341A] Fall from ground level [W18.30XA] Patient Active Problem List   Diagnosis Date Noted  . Anemia 10/07/2019  . AKI (acute kidney injury) ([place]) 10/06/2019  . Leukocytosis 10/06/2019  . Dementia without behavioral disturbance ([place]) 10/06/2019  . HTN (hypertension)  10/06/2019  . Closed right femoral fracture ([place]) 10/05/2019  . Sepsis ([place]) 03/19/2018  . Febrile illness 03/18/2018  . Memory disorder 04/02/2017  . Chronic constipation 08/11/2014  . Hyperlipidemia with target LDL less than 130 07/12/2014  . Migraine without aura and without status migrainosus, not intractable 07/12/2014  . Other constipation 06/06/2014  . Systolic murmur 11/55/2080  . Dementia arising in the senium and presenium ([place]) 08/16/2013  . Depression with anxiety 08/16/2013  . Osteoporosis 08/11/2013   PCP:  Carol Ada, MD Pharmacy:   [facility], [address] [place] [address] Phone: [phone] Fax: [phone]     Social Determinants of Health (SDOH) Interventions    Readmission Risk Interventions No flowsheet data found.  Emeterio Reeve, Latanya Presser, [place] Social Worker [phone]

## 2019-10-09 LAB — BASIC METABOLIC PANEL
Anion gap: 9 (ref 5–15)
BUN: 20 mg/dL (ref 8–23)
CO2: 23 mmol/L (ref 22–32)
Calcium: 8.8 mg/dL — ABNORMAL LOW (ref 8.9–10.3)
Chloride: 109 mmol/L (ref 98–111)
Creatinine, Ser: 0.89 mg/dL (ref 0.44–1.00)
GFR calc Af Amer: >60 mL/min (ref >60)
GFR calc non Af Amer: 60 mL/min — ABNORMAL LOW (ref >60)
Glucose, Bld: 88 mg/dL (ref 70–99)
Potassium: 4 mmol/L (ref 3.5–5.1)
Sodium: 141 mmol/L (ref 135–145)

## 2019-10-09 LAB — CBC
HCT: 28.5 % — ABNORMAL LOW (ref 36.0–46.0)
Hemoglobin: 8.8 g/dL — ABNORMAL LOW (ref 12.0–15.0)
MCH: 28.8 pg (ref 26.0–34.0)
MCHC: 30.9 g/dL (ref 30.0–36.0)
MCV: 93.1 fL (ref 80.0–100.0)
Platelets: 115 10*3/uL — ABNORMAL LOW (ref 150–400)
RBC: 3.06 MIL/uL — ABNORMAL LOW (ref 3.87–5.11)
RDW: 18.6 % — ABNORMAL HIGH (ref 11.5–15.5)
WBC: 7.7 10*3/uL (ref 4.0–10.5)
nRBC: 0 % (ref 0.0–0.2)

## 2019-10-09 NOTE — Plan of Care (Signed)
  Problem: Activity: Goal: Risk for activity intolerance will decrease Outcome: Progressing   Problem: Coping: Goal: Level of anxiety will decrease Outcome: Progressing   Problem: Pain Managment: Goal: General experience of comfort will improve Outcome: Progressing   Problem: Safety: Goal: Ability to remain free from injury will improve Outcome: Progressing   Problem: Skin Integrity: Goal: Risk for impaired skin integrity will decrease Outcome: Progressing   

## 2019-10-09 NOTE — Progress Notes (Addendum)
Progress Note    Jeanne Lopez  FFM:[id number] DOB: 09-25-1932  DOA: 10/05/2019 PCP: Merri Brunette, MD    Brief Narrative:   Medical records reviewed and are as summarized below:  Jeanne Lopez is an 86 y.o. female with medical history significant for dementia, osteoporosis, hypertension, hyperlipidemia and depression who presents status post mechanical fall.Right femur x-ray significant for spiral displaced and angulated mid femoral fracture. S/p repair on 8/26.  Assessment/Plan:   Principal Problem:   Closed right femoral fracture (HCC) Active Problems:   AKI (acute kidney injury) (HCC)   Leukocytosis   Dementia without behavioral disturbance (HCC)   HTN (hypertension)   Anemia   Spiral displaced and angulated mid right femoral fracture s/p mechanical fall -OR on 8/26 PRN pain management  -PT Eval recommends SNF-- bed placement pending  ABLA -expected post-op -s/p 1 unit PRBCs  AKI -stable  Thrombocytopenia -monitor while on lovenox  Leukocytosis -resolved  Dementia Continue donezepil, memantine, trazodone and venlafaxine  Hypertension with episode of hypotensive -d/c amlodipine   Family Communication/Anticipated D/C date and plan/Code Status   DVT prophylaxis: lovenox Code Status: Full Code.  Disposition Plan: Status is: Inpatient No family at bedside  Remains inpatient appropriate because:Inpatient level of care appropriate due to severity of illness   Dispo: The patient is from: Home              Anticipated d/c is to: SNF  When bed available              Anticipated d/c date is: when safe d/c plan arranged              Patient currently is  medically stable to d/c.         Medical Consultants:    ortho  Subjective:   Hip pain is similar to prior-- having some spasms  Objective:    Vitals:   10/08/19 1500 10/09/19 0100 10/09/19 0325 10/09/19 0833  BP: (!) 107/48 (!) 120/55 (!) 118/51 127/82  Pulse: 66 66 (!) 55 60    Resp: 17 16 15 18   Temp: 98.8 F (37.1 C) 98.9 F (37.2 C) 98.1 F (36.7 C) 98 F (36.7 C)  TempSrc: Axillary Oral Oral   SpO2: 94% 96% 92% 92%  Weight:      Height:        Intake/Output Summary (Last 24 hours) at 10/09/2019 1033 Last data filed at 10/09/2019 0500 Gross per 24 hour  Intake --  Output 750 ml  Net -750 ml   Filed Weights   10/05/19 1549  Weight: 49.9 kg    Exam:   General: Appearance:    Well developed, well nourished female in no acute distress     Lungs:     respirations unlabored  Heart:    Normal heart rate. Normal rhythm.    MS:   All extremities are intact.   Neurologic:   Pleasant and cooperative    Data Reviewed:   I have personally reviewed following labs and imaging studies:  Labs: Labs show the following:   Basic Metabolic Panel: Recent Labs  Lab 10/05/19 1726 10/05/19 1726 10/07/19 0314 10/07/19 0314 10/08/19 0334 10/09/19 0122  NA 140  --  138  --  142 141  K 4.3   < > 4.4   < > 3.9 4.0  CL 105  --  108  --  110 109  CO2 24  --  25  --  27 23  GLUCOSE 107*  --  147*  --  99 88  BUN 19  --  24*  --  21 20  CREATININE 1.05*  --  1.09*  --  0.93 0.89  CALCIUM 9.3  --  8.3*  --  8.7* 8.8*   < > = values in this interval not displayed.   GFR Estimated Creatinine Clearance: 37.1 mL/min (by C-G formula based on SCr of 0.89 mg/dL). Liver Function Tests: Recent Labs  Lab 10/07/19 0314  AST 31  ALT 13  ALKPHOS 46  BILITOT 0.6  PROT 4.9*  ALBUMIN 2.8*   No results for input(s): LIPASE, AMYLASE in the last 168 hours. No results for input(s): AMMONIA in the last 168 hours. Coagulation profile Recent Labs  Lab 10/05/19 1726  INR 1.0    CBC: Recent Labs  Lab 10/05/19 1726 10/07/19 0648 10/08/19 0334 10/09/19 0122  WBC 14.2* 8.3 7.5 7.7  NEUTROABS 12.3*  --   --   --   HGB 13.1 7.2* 8.6* 8.8*  HCT 42.1 23.2* 27.1* 28.5*  MCV 97.5 98.3 92.5 93.1  PLT 186 116* 101* 115*   Cardiac Enzymes: No results for  input(s): CKTOTAL, CKMB, CKMBINDEX, TROPONINI in the last 168 hours. BNP (last 3 results) No results for input(s): PROBNP in the last 8760 hours. CBG: No results for input(s): GLUCAP in the last 168 hours. D-Dimer: No results for input(s): DDIMER in the last 72 hours. Hgb A1c: No results for input(s): HGBA1C in the last 72 hours. Lipid Profile: No results for input(s): CHOL, HDL, LDLCALC, TRIG, CHOLHDL, LDLDIRECT in the last 72 hours. Thyroid function studies: No results for input(s): TSH, T4TOTAL, T3FREE, THYROIDAB in the last 72 hours.  Invalid input(s): FREET3 Anemia work up: No results for input(s): VITAMINB12, FOLATE, FERRITIN, TIBC, IRON, RETICCTPCT in the last 72 hours. Sepsis Labs: Recent Labs  Lab 10/05/19 1726 10/07/19 0648 10/08/19 0334 10/09/19 0122  WBC 14.2* 8.3 7.5 7.7    Microbiology Recent Results (from the past 240 hour(s))  SARS Coronavirus 2 by RT PCR (hospital order, performed in [facility] hospital lab) Nasopharyngeal Nasopharyngeal Swab     Status: None   Collection Time: 10/05/19  5:26 PM   Specimen: Nasopharyngeal Swab  Result Value Ref Range Status   SARS Coronavirus 2 NEGATIVE NEGATIVE Final    Comment: (NOTE) SARS-CoV-2 target nucleic acids are NOT DETECTED.  The SARS-CoV-2 RNA is generally detectable in upper and lower respiratory specimens during the acute phase of infection. The lowest concentration of SARS-CoV-2 viral copies this assay can detect is 250 copies / mL. A negative result does not preclude SARS-CoV-2 infection and should not be used as the sole basis for treatment or other patient management decisions.  A negative result may occur with improper specimen collection / handling, submission of specimen other than nasopharyngeal swab, presence of viral mutation(s) within the areas targeted by this assay, and inadequate number of viral copies (<250 copies / mL). A negative result must be combined with clinical observations, patient  history, and epidemiological information.  Fact Sheet for Patients:   BoilerBrush.com.cy  Fact Sheet for Healthcare Providers: https://pope.com/  This test is not yet approved or  cleared by the [place] FDA and has been authorized for detection and/or diagnosis of SARS-CoV-2 by FDA under an Emergency Use Authorization (EUA).  This EUA will remain in effect (meaning this test can be used) for the duration of the COVID-19 declaration under Section 564(b)(1) of the Act, 21 U.S.C. section  360bbb-3(b)(1), unless the authorization is terminated or revoked sooner.  Performed at [facility], [address] [place], [place] [address]   Surgical pcr screen     Status: None   Collection Time: 10/06/19  2:52 AM   Specimen: Nasal Mucosa; Nasal Swab  Result Value Ref Range Status   MRSA, PCR NEGATIVE NEGATIVE Final   Staphylococcus aureus NEGATIVE NEGATIVE Final    Comment: (NOTE) The Xpert SA Assay (FDA approved for NASAL specimens in patients 58 years of age and older), is one component of a comprehensive surveillance program. It is not intended to diagnose infection nor to guide or monitor treatment. Performed at [facility], [address] [place], [place] [address]     Procedures and diagnostic studies:  No results found.  Medications:   . acetaminophen  1,000 mg Oral TID  . vitamin C  500 mg Oral Daily  . cholecalciferol  2,000 Units Oral BID  . docusate sodium  100 mg Oral BID  . donepezil  10 mg Oral QHS  . enoxaparin (LOVENOX) injection  40 mg Subcutaneous Q24H  . feeding supplement (ENSURE ENLIVE)  237 mL Oral TID BM  . gabapentin  100 mg Oral BID  . memantine  10 mg Oral BID  . multivitamin with minerals  1 tablet Oral Daily  . traZODone  100 mg Oral QHS  . venlafaxine XR  150 mg Oral Daily   Continuous Infusions:    LOS: 4 days   Joseph Art  [facility]   How to contact the [facility]  Attending or Consulting provider 7A - 7P or covering provider during after hours 7P -7A, for this patient?  1. Check the care team in [facility] and look for a) attending/consulting TRH provider listed and b) the [facility] team listed 2. Log into [email or web address] and use [place]'s universal password to access. If you do not have the password, please contact the hospital operator. 3. Locate the [facility] provider you are looking for under [facility] and page to a number that you can be directly reached. 4. If you still have difficulty reaching the provider, please page the [facility] (Director on Call) for the Hospitalists listed on amion for assistance.  10/09/2019, 10:33 AM

## 2019-10-09 NOTE — Progress Notes (Signed)
Patient ID: Jeanne Lopez, female   DOB: 1933-01-28, 86 y.o.   MRN: [id number]   ORTHOPAEDIC PROGRESS NOTE  s/p Procedure(s): INTRAMEDULLARY (IM) NAIL FEMORAL  SUBJECTIVE: Reports mild pain about operative site. No chest pain. No SOB. No nausea/vomiting. No other complaints.  Patient is awake and pleasant with dementia   OBJECTIVE: PE:  Vitals:   10/09/19 0833 10/09/19 1301  BP: 127/82 (!) 115/48  Pulse: 60 64  Resp: 18 18  Temp: 98 F (36.7 C) 98 F (36.7 C)  SpO2: 92% 93%    Surgical wounds well approximated  ASSESSMENT: Jeanne Lopez is a 86 y.o. female doing well postoperatively.  PLAN: Weightbearing: NWB RLE Insicional and dressing care: dressings removed will be replaced by nursing Orthopedic device(s): None Showering: no yet VTE prophylaxis: Lovenox 40mg  qd  Pain control: under control  Follow - up plan: waiting on SNF Contact information:

## 2019-10-09 NOTE — Plan of Care (Signed)

## 2019-10-09 NOTE — Plan of Care (Signed)
  Problem: Pain Managment: Goal: General experience of comfort will improve Outcome: Progressing   Problem: Safety: Goal: Ability to remain free from injury will improve Outcome: Progressing   Problem: Skin Integrity: Goal: Risk for impaired skin integrity will decrease Outcome: Progressing   

## 2019-10-10 LAB — CBC
HCT: 31.1 % — ABNORMAL LOW (ref 36.0–46.0)
Hemoglobin: 9.8 g/dL — ABNORMAL LOW (ref 12.0–15.0)
MCH: 29.5 pg (ref 26.0–34.0)
MCHC: 31.5 g/dL (ref 30.0–36.0)
MCV: 93.7 fL (ref 80.0–100.0)
Platelets: 165 10*3/uL (ref 150–400)
RBC: 3.32 MIL/uL — ABNORMAL LOW (ref 3.87–5.11)
RDW: 17.8 % — ABNORMAL HIGH (ref 11.5–15.5)
WBC: 6.2 10*3/uL (ref 4.0–10.5)
nRBC: 0 % (ref 0.0–0.2)

## 2019-10-10 LAB — BASIC METABOLIC PANEL
Anion gap: 9 (ref 5–15)
BUN: 14 mg/dL (ref 8–23)
CO2: 24 mmol/L (ref 22–32)
Calcium: 8.8 mg/dL — ABNORMAL LOW (ref 8.9–10.3)
Chloride: 107 mmol/L (ref 98–111)
Creatinine, Ser: 0.79 mg/dL (ref 0.44–1.00)
GFR calc Af Amer: >60 mL/min (ref >60)
GFR calc non Af Amer: >60 mL/min (ref >60)
Glucose, Bld: 99 mg/dL (ref 70–99)
Potassium: 4.1 mmol/L (ref 3.5–5.1)
Sodium: 140 mmol/L (ref 135–145)

## 2019-10-10 MED ORDER — ENSURE ENLIVE PO LIQD: 237.0000 mL | Freq: Three times a day (TID) | ORAL | 12 refills | Status: DC

## 2019-10-10 NOTE — Social Work (Signed)
Pt was approved for 30 days or less, Passr number [id number].   Jimmy Picket, Theresia Majors, [place] Clinical Social Worker [phone]

## 2019-10-10 NOTE — TOC Progression Note (Addendum)
Transition of Care [facility]) - Progression Note    Patient Details  Name: Jeanne Lopez MRN: [id number] Date of Birth: 03-12-1932  Transition of Care [facility]) CM/SW Contact  Epifanio Lesches, RN Phone Number: [phone] 10/10/2019, 1:56 PM  Clinical Narrative:     SNF bed offers shared with pt's daughter, [name]. [facility] selected. NCM has made [facility] admissions aware by voice message. Awaiting return call for bed offering ....  TOC team will continue to monitor and follow.   NCM received call from [facility] admission. SNF bed offer extended and accepted. Bed will be available, 8/31, MD and nurse made aware. No updated COVID needed.  Expected Discharge Plan: Skilled Nursing Facility Barriers to Discharge: No SNF bed  Expected Discharge Plan and Services Expected Discharge Plan: Skilled Nursing Facility       Living arrangements for the past 2 months: Single Family Home Expected Discharge Date: 10/10/19                                     Social Determinants of Health (SDOH) Interventions    Readmission Risk Interventions No flowsheet data found.

## 2019-10-10 NOTE — Progress Notes (Signed)
Physical Therapy Treatment Patient Details Name: Jeanne Lopez MRN: [id number] DOB: 02-May-1932 Today's Date: 10/10/2019    History of Present Illness Abelina A Koelzer is an 86 y.o. female with medical history significant for dementia, osteoporosis, hypertension, hyperlipidemia and depression who presents status post mechanical fall.Right femur x-ray significant for spiral displaced and angulated mid femoral fracture. S/p repair on 8/26 with IM nail.    PT Comments    Pt restless and reporting R hip pain upon arrival to room. Pt overall requiring max assist for bed mobility and transfers x2 this day, limited by R hip/LE pain and difficulty following WB precautions. Pt instructed in low level HEP to perform while up in recliner. PT continuing to recommend SNF level of care post-acutely.     Follow Up Recommendations  SNF     Equipment Recommendations  None recommended by PT    Recommendations for Other Services       Precautions / Restrictions Precautions Precautions: Fall Restrictions Weight Bearing Restrictions: Yes RLE Weight Bearing: Non weight bearing Other Position/Activity Restrictions: TDWB previously documented, surgery notes reflect NWB RLE    Mobility  Bed Mobility Overal bed mobility: Needs Assistance Bed Mobility: Supine to Sit     Supine to sit: Max assist;HOB elevated     General bed mobility comments: Max assist for LE and trunk management, scooting to EOB with assist of bed pads. Very increased time, step-by-step instruction to perform.  Transfers Overall transfer level: Needs assistance Equipment used: 1 person hand held assist Transfers: Sit to/from [employer] Sit to Stand: Max assist Stand pivot transfers: Max assist       General transfer comment: Max assist for power up, maintaining NWB RLE, and supporting bodyweight for transfer to [facility]. Stand pivot transfer x2, bed>BSC and BSC>recliner.  Ambulation/Gait              General Gait Details: unable   Stairs             Wheelchair Mobility    Modified Rankin (Stroke Patients Only)       Balance Overall balance assessment: Needs assistance;History of Falls Sitting-balance support: Feet supported;Bilateral upper extremity supported Sitting balance-Leahy Scale: Fair Sitting balance - Comments: able to sit EOB without PT support   Standing balance support: Bilateral upper extremity supported Standing balance-Leahy Scale: Zero Standing balance comment: requires maximum assist to stand and transfer today                            Cognition Arousal/Alertness: Awake/alert Behavior During Therapy: Restless;Anxious Overall Cognitive Status: History of cognitive impairments - at baseline                                 General Comments: Pt with history of dementia. Fidgeting with gown and reaching for PT during mobility secondary to anxiety. Follows one-step commands inconsistently, and struggles with NWB RLE.      Exercises General Exercises - Lower Extremity Ankle Circles/Pumps: AROM;Both;10 reps;Seated Quad Sets: AAROM;Right;5 reps;Seated (in recliner)    General Comments        Pertinent Vitals/Pain Pain Assessment: 0-10 Pain Score: 10-Worst pain ever Faces Pain Scale: No hurt Pain Location: R hip with movement Pain Descriptors / Indicators: Guarding;Grimacing;Sore Pain Intervention(s): Limited activity within patient's tolerance;Monitored during session;Repositioned    Home Living  Prior Function            PT Goals (current goals can now be found in the care plan section) Acute Rehab PT Goals Patient Stated Goal: None stated PT Goal Formulation: With patient/family Time For Goal Achievement: 10/21/19 Potential to Achieve Goals: Fair Progress towards PT goals: Progressing toward goals    Frequency    Min 3X/week      PT Plan Current plan remains  appropriate    Co-evaluation              AM-PAC PT "6 Clicks" Mobility   Outcome Measure  Help needed turning from your back to your side while in a flat bed without using bedrails?: A Lot Help needed moving from lying on your back to sitting on the side of a flat bed without using bedrails?: A Lot Help needed moving to and from a bed to a chair (including a wheelchair)?: Total Help needed standing up from a chair using your arms (e.g., wheelchair or bedside chair)?: Total Help needed to walk in hospital room?: Total Help needed climbing 3-5 steps with a railing? : Total 6 Click Score: 8    End of Session Equipment Utilized During Treatment: Gait belt Activity Tolerance: Patient limited by pain Patient left: in chair;with call bell/phone within reach (no chair alarms on unit, Diplomatic Services operational officer ordering more, NT notified) Nurse Communication: Need for lift equipment;Mobility status PT Visit Diagnosis: History of falling (Z91.81);Difficulty in walking, not elsewhere classified (R26.2);Pain Pain - Right/Left: Right Pain - part of body: Hip     Time: 4680-3212 PT Time Calculation (min) (ACUTE ONLY): 19 min  Charges:  $Therapeutic Activity: 8-22 mins                     Henning Ehle E, PT Acute Rehabilitation Services Pager [phone]  Office [phone]   Weiland Tomich D Despina Hidden 10/10/2019, 9:44 AM

## 2019-10-10 NOTE — Progress Notes (Signed)
  Speech Language Pathology Treatment: Dysphagia  Patient Details Name: Jeanne Lopez MRN: [id number] DOB: [birth year] Today's Date: 10/10/2019 Time: 0821-0829 SLP Time Calculation (min) (ACUTE ONLY): 8 min  Assessment / Plan / Recommendation Clinical Impression  Pt was seen for skilled ST targeting diet tolerance and diagnostic treatment.  She was encountered awake/alert in bed and was agreeable to this tx session.  RN and pt reported that she had been tolerating her current diet of Dysphagia 3 (soft) solids and thin liquids without difficulty.  Pt consumed trials of thin liquid via cup and straw in addition to regular solids (graham cracker).  Mastication was timely, with suspected timely AP transport and swallow initiation.  Minimal grimacing was noted during swallow initiation in 1/3 regular solid trials, but pt denied odynophagia.  No clinical s/sx of aspiration were observed with any trials.  Recommend diet upgrade to regular solids and thin liquids with medications administered whole with liquid.  No additional skilled ST is warranted at this time.  Please re-consult if additional needs arise.     HPI HPI: 86yo female admitted 10/05/19 after a fall. PMH: dementia, osteoporosis, HTN, HLD, depression. Right Hip surgery 8/26      SLP Plan  All goals met; Discharge from [place] service       Recommendations  Diet recommendations: Regular;Thin liquid Liquids provided via: Cup;Straw Medication Administration: Whole meds with liquid Supervision: Patient able to self feed;Intermittent supervision to cue for compensatory strategies Compensations: Minimize environmental distractions;Slow rate;Small sips/bites Postural Changes and/or Swallow Maneuvers: Seated upright 90 degrees                Oral Care Recommendations: Oral care BID Follow up Recommendations: 24 hour supervision/assistance SLP Visit Diagnosis: Dysphagia, unspecified (R13.10) Plan: All goals met        Colin Mulders M.S.,  [place] Acute Rehabilitation Services Office: [phone]   [place] 10/10/2019, 8:33 AM

## 2019-10-10 NOTE — Discharge Summary (Signed)
Physician Discharge Summary  [name] BWL:[id number] DOB: December 30, 1932 DOA: 10/05/2019  PCP: Jeanne Brunette, MD  Admit date: 10/05/2019 Discharge date: 10/10/2019  Admitted From: home Discharge disposition: snf   Recommendations for Outpatient Follow-Up:   Cbc 1 week lovenox per ortho for 21 days total Recommend DEXA in 4-8 weeks   Discharge Diagnosis:   Principal Problem:   Closed right femoral fracture (HCC) Active Problems:   AKI (acute kidney injury) (HCC)   Leukocytosis   Dementia without behavioral disturbance (HCC)   HTN (hypertension)   Anemia    Discharge Condition: Improved.  Diet recommendation:  Regular.  Wound care: None.  Code status: Full.   History of Present Illness:   Jeanne Lopez is a 86 y.o. female with medical history significant for dementia, osteoporosis, hypertension, hyperlipidemia and depression who presents status post mechanical fall.  Daughter at bedside provides most of the history.  Patient was outside walking in the yard today when she stumbled and fell on her right side into her flower bed.  She then called out for help and her daughter came out and saw her and caught EMS.  She states that patient was otherwise in her normal state of health.  Denies any dizziness or lightheadedness.  No chest pain or palpitations prior to this episode.  Daughter has noted though for the past week she is seem to be a little bit more unsteady with her feet but normally does not need any assistance with ambulation.  She continues to have good appetite.  No fevers.  No nausea, vomiting or diarrhea.  No other signs of infection.   Hospital Course by Problem:   Spiral displaced and angulated mid right femoral fracture s/p mechanical fall -OR on 8/26 PRN pain management with bowel regimen -PT Eval recommends SNF  ABLA -expected post-op -s/p 1 unit PRBCs -stable- PRN monitoring  AKI -stable  Thrombocytopenia -monitor while on  lovenox  Leukocytosis -resolved  Dementia Continue donezepil, memantine, trazodone and venlafaxine  Hypertension with episode of hypotensive -d/c amlodipine    Medical Consultants:   ortho   Discharge Exam:   Vitals:   10/10/19 0439 10/10/19 0801  BP: (!) 127/53 (!) 125/53  Pulse: (!) 56 (!) 57  Resp: 14 17  Temp: 98.2 F (36.8 C) 98.2 F (36.8 C)  SpO2: 95% 94%   Vitals:   10/09/19 1301 10/09/19 2000 10/10/19 0439 10/10/19 0801  BP: (!) 115/48 (!) 108/56 (!) 127/53 (!) 125/53  Pulse: 64 68 (!) 56 (!) 57  Resp: 18 16 14 17   Temp: 98 F (36.7 C) 98.2 F (36.8 C) 98.2 F (36.8 C) 98.2 F (36.8 C)  TempSrc:  Oral Oral Oral  SpO2: 93% 92% 95% 94%  Weight:      Height:        General exam: Appears calm and comfortable.    The results of significant diagnostics from this hospitalization (including imaging, microbiology, ancillary and laboratory) are listed below for reference.     Procedures and Diagnostic Studies:   DG Pelvis 1-2 Views  Result Date: 10/05/2019 CLINICAL DATA:  Right femur deformity EXAM: PELVIS - 1-2 VIEW COMPARISON:  CT 03/19/2018 FINDINGS: Zipper artifact over the right iliac bone. Pubic symphysis appears intact. Both femoral heads project in joint. No fracture is seen. IMPRESSION: Negative. Electronically Signed   By: 05/18/2018 M.D.   On: 10/05/2019 17:29   DG Chest Port 1 View  Result Date: 10/05/2019 CLINICAL DATA:  Preop fall EXAM: PORTABLE CHEST 1 VIEW COMPARISON:  03/17/2018 FINDINGS: No focal opacity or pleural effusion. Mild cardiomegaly. No pneumothorax. IMPRESSION: No active disease. Mild cardiomegaly. Electronically Signed   By: Jasmine Pang M.D.   On: 10/05/2019 17:26   DG C-Arm 1-60 Min  Result Date: 10/06/2019 CLINICAL DATA:  Open reduction internal fixation for fracture EXAM: RIGHT FEMUR 2 VIEWS; DG C-ARM 1-60 MIN COMPARISON:  Right femur radiographs October 05, 2019 FLUOROSCOPY TIME:  2 minutes 27 seconds; 16.60 mGy;  6 acquired images. FINDINGS: Frontal and lateral views show rod fixation through a comminuted fracture of the mid femur. There are fixating screws proximally with screw tips in the femoral head region. At the fracture site, there is still mild displacement of fracture fragments but less displacement than on preoperative study. No new fracture. No dislocation. There is mild narrowing of the right hip joint. IMPRESSION: Fixation through fracture of the mid femur with alignment near anatomic. No new fracture. No dislocation. Fixation screws in proximal femoral head. Moderate narrowing right hip joint. Electronically Signed   By: Bretta Bang III M.D.   On: 10/06/2019 13:02   DG FEMUR, MIN 2 VIEWS RIGHT  Result Date: 10/06/2019 CLINICAL DATA:  Status post ORIF of right femoral fracture EXAM: RIGHT FEMUR 2 VIEWS COMPARISON:  10/05/2019 as well as intraoperative films from earlier in the same day. FINDINGS: Multiple fracture lines are noted within the midportion of the right femur. Medullary rod with fixation screws both proximally and distally has been placed. No new focal abnormality is noted. IMPRESSION: Status post ORIF of complicated right femoral fracture. Electronically Signed   By: Alcide Clever M.D.   On: 10/06/2019 17:23   DG FEMUR, MIN 2 VIEWS RIGHT  Result Date: 10/06/2019 CLINICAL DATA:  Open reduction internal fixation for fracture EXAM: RIGHT FEMUR 2 VIEWS; DG C-ARM 1-60 MIN COMPARISON:  Right femur radiographs October 05, 2019 FLUOROSCOPY TIME:  2 minutes 27 seconds; 16.60 mGy; 6 acquired images. FINDINGS: Frontal and lateral views show rod fixation through a comminuted fracture of the mid femur. There are fixating screws proximally with screw tips in the femoral head region. At the fracture site, there is still mild displacement of fracture fragments but less displacement than on preoperative study. No new fracture. No dislocation. There is mild narrowing of the right hip joint. IMPRESSION:  Fixation through fracture of the mid femur with alignment near anatomic. No new fracture. No dislocation. Fixation screws in proximal femoral head. Moderate narrowing right hip joint. Electronically Signed   By: Bretta Bang III M.D.   On: 10/06/2019 13:02   DG FEMUR, MIN 2 VIEWS RIGHT  Result Date: 10/05/2019 CLINICAL DATA:  Fall, leg deformity EXAM: RIGHT FEMUR 2 VIEWS COMPARISON:  None. FINDINGS: There is an spiral displaced and angulated fracture through the mid shaft of the right femur. No subluxation or dislocation. IMPRESSION: Spiral displaced and angulated mid femoral fracture. Electronically Signed   By: Charlett Nose M.D.   On: 10/05/2019 17:09     Labs:   Basic Metabolic Panel: Recent Labs  Lab 10/05/19 1726 10/05/19 1726 10/07/19 0314 10/07/19 0314 10/08/19 0334 10/08/19 0334 10/09/19 0122 10/10/19 0346  NA 140  --  138  --  142  --  141 140  K 4.3   < > 4.4   < > 3.9   < > 4.0 4.1  CL 105  --  108  --  110  --  109 107  CO2 24  --  25  --  27  --  23 24  GLUCOSE 107*  --  147*  --  99  --  88 99  BUN 19  --  24*  --  21  --  20 14  CREATININE 1.05*  --  1.09*  --  0.93  --  0.89 0.79  CALCIUM 9.3  --  8.3*  --  8.7*  --  8.8* 8.8*   < > = values in this interval not displayed.   GFR Estimated Creatinine Clearance: 41.2 mL/min (by C-G formula based on SCr of 0.79 mg/dL). Liver Function Tests: Recent Labs  Lab 10/07/19 0314  AST 31  ALT 13  ALKPHOS 46  BILITOT 0.6  PROT 4.9*  ALBUMIN 2.8*   No results for input(s): LIPASE, AMYLASE in the last 168 hours. No results for input(s): AMMONIA in the last 168 hours. Coagulation profile Recent Labs  Lab 10/05/19 1726  INR 1.0    CBC: Recent Labs  Lab 10/05/19 1726 10/07/19 0648 10/08/19 0334 10/09/19 0122 10/10/19 0346  WBC 14.2* 8.3 7.5 7.7 6.2  NEUTROABS 12.3*  --   --   --   --   HGB 13.1 7.2* 8.6* 8.8* 9.8*  HCT 42.1 23.2* 27.1* 28.5* 31.1*  MCV 97.5 98.3 92.5 93.1 93.7  PLT 186 116* 101*  115* 165   Cardiac Enzymes: No results for input(s): CKTOTAL, CKMB, CKMBINDEX, TROPONINI in the last 168 hours. BNP: Invalid input(s): POCBNP CBG: No results for input(s): GLUCAP in the last 168 hours. D-Dimer No results for input(s): DDIMER in the last 72 hours. Hgb A1c No results for input(s): HGBA1C in the last 72 hours. Lipid Profile No results for input(s): CHOL, HDL, LDLCALC, TRIG, CHOLHDL, LDLDIRECT in the last 72 hours. Thyroid function studies No results for input(s): TSH, T4TOTAL, T3FREE, THYROIDAB in the last 72 hours.  Invalid input(s): FREET3 Anemia work up No results for input(s): VITAMINB12, FOLATE, FERRITIN, TIBC, IRON, RETICCTPCT in the last 72 hours. Microbiology Recent Results (from the past 240 hour(s))  SARS Coronavirus 2 by RT PCR (hospital order, performed in [facility] hospital lab) Nasopharyngeal Nasopharyngeal Swab     Status: None   Collection Time: 10/05/19  5:26 PM   Specimen: Nasopharyngeal Swab  Result Value Ref Range Status   SARS Coronavirus 2 NEGATIVE NEGATIVE Final    Comment: (NOTE) SARS-CoV-2 target nucleic acids are NOT DETECTED.  The SARS-CoV-2 RNA is generally detectable in upper and lower respiratory specimens during the acute phase of infection. The lowest concentration of SARS-CoV-2 viral copies this assay can detect is 250 copies / mL. A negative result does not preclude SARS-CoV-2 infection and should not be used as the sole basis for treatment or other patient management decisions.  A negative result may occur with improper specimen collection / handling, submission of specimen other than nasopharyngeal swab, presence of viral mutation(s) within the areas targeted by this assay, and inadequate number of viral copies (<250 copies / mL). A negative result must be combined with clinical observations, patient history, and epidemiological information.  Fact Sheet for Patients:   BoilerBrush.com.cy  Fact  Sheet for Healthcare Providers: https://pope.com/  This test is not yet approved or  cleared by the [place] FDA and has been authorized for detection and/or diagnosis of SARS-CoV-2 by FDA under an Emergency Use Authorization (EUA).  This EUA will remain in effect (meaning this test can be used) for the duration of the COVID-19 declaration under Section 564(b)(1) of the  Act, 21 U.S.C. section 360bbb-3(b)(1), unless the authorization is terminated or revoked sooner.  Performed at [facility], [address] [place], [place] [address]   Surgical pcr screen     Status: None   Collection Time: 10/06/19  2:52 AM   Specimen: Nasal Mucosa; Nasal Swab  Result Value Ref Range Status   MRSA, PCR NEGATIVE NEGATIVE Final   Staphylococcus aureus NEGATIVE NEGATIVE Final    Comment: (NOTE) The Xpert SA Assay (FDA approved for NASAL specimens in patients 84 years of age and older), is one component of a comprehensive surveillance program. It is not intended to diagnose infection nor to guide or monitor treatment. Performed at [facility], [address] [place], [place] [address]      Discharge Instructions:   Discharge Instructions    Increase activity slowly   Complete by: As directed    No wound care   Complete by: As directed      Allergies as of 10/10/2019      Reactions   Codeine Nausea And Vomiting      Medication List    STOP taking these medications   amLODipine 5 MG tablet Commonly known as: NORVASC   SUMAtriptan 50 MG tablet Commonly known as: IMITREX     TAKE these medications   acetaminophen 500 MG tablet Commonly known as: TYLENOL Take 2 tablets (1,000 mg total) by mouth 3 (three) times daily.   ascorbic acid 500 MG tablet Commonly known as: VITAMIN C Take 1 tablet (500 mg total) by mouth daily.   docusate sodium 100 MG capsule Commonly known as: COLACE Take 1 capsule (100 mg total) by mouth 2 (two) times  daily. What changed: when to take this   donepezil 10 MG tablet Commonly known as: ARICEPT Take 1 tablet (10 mg total) by mouth at bedtime.   enoxaparin 40 MG/0.4ML injection Commonly known as: LOVENOX Inject 0.4 mLs (40 mg total) into the skin daily for 21 days.   feeding supplement (ENSURE ENLIVE) Liqd Take 237 mLs by mouth 3 (three) times daily between meals.   gabapentin 100 MG capsule Commonly known as: Neurontin Take 1 capsule (100 mg total) by mouth 2 (two) times daily.   memantine 10 MG tablet Commonly known as: NAMENDA Take 1 tablet (10 mg total) by mouth 2 (two) times daily.   multivitamin with minerals Tabs tablet Take 1 tablet by mouth daily.   polyethylene glycol 17 g packet Commonly known as: MIRALAX / GLYCOLAX Take 17 g by mouth daily as needed for mild constipation.   traMADol 50 MG tablet Commonly known as: ULTRAM Take 1 tablet (50 mg total) by mouth every 8 (eight) hours as needed for moderate pain or severe pain.   traZODone 100 MG tablet Commonly known as: DESYREL Take 1 tablet (100 mg total) by mouth at bedtime.   venlafaxine XR 150 MG 24 hr capsule Commonly known as: EFFEXOR-XR Take 150 mg by mouth daily.   Vitamin D3 25 MCG tablet Commonly known as: Vitamin D Take 2 tablets (2,000 Units total) by mouth 2 (two) times daily.       Follow-up Information    Myrene GalasHandy, Michael, MD. Schedule an appointment as soon as possible for a visit in 10 day(s).   Specialty: Orthopedic Surgery Contact information: [address] [place] [address] [phone]                Time coordinating discharge: 35 min  Signed:  Shanda BumpsJessica  U Bryella Diviney DO  [facility] 10/10/2019, 11:41 AM

## 2019-10-10 NOTE — Plan of Care (Signed)

## 2019-10-11 DIAGNOSIS — R0989 Other specified symptoms and signs involving the circulatory and respiratory systems: Secondary | ICD-10-CM | POA: Diagnosis not present

## 2019-10-11 DIAGNOSIS — M6281 Muscle weakness (generalized): Secondary | ICD-10-CM | POA: Diagnosis not present

## 2019-10-11 DIAGNOSIS — F411 Generalized anxiety disorder: Secondary | ICD-10-CM | POA: Diagnosis not present

## 2019-10-11 DIAGNOSIS — F331 Major depressive disorder, recurrent, moderate: Secondary | ICD-10-CM | POA: Diagnosis not present

## 2019-10-11 DIAGNOSIS — S7291XD Unspecified fracture of right femur, subsequent encounter for closed fracture with routine healing: Secondary | ICD-10-CM | POA: Diagnosis not present

## 2019-10-11 DIAGNOSIS — R410 Disorientation, unspecified: Secondary | ICD-10-CM | POA: Diagnosis not present

## 2019-10-11 DIAGNOSIS — N178 Other acute kidney failure: Secondary | ICD-10-CM | POA: Diagnosis not present

## 2019-10-11 DIAGNOSIS — Z743 Need for continuous supervision: Secondary | ICD-10-CM | POA: Diagnosis not present

## 2019-10-11 DIAGNOSIS — D6489 Other specified anemias: Secondary | ICD-10-CM | POA: Diagnosis not present

## 2019-10-11 DIAGNOSIS — Z9181 History of falling: Secondary | ICD-10-CM | POA: Diagnosis not present

## 2019-10-11 DIAGNOSIS — M2550 Pain in unspecified joint: Secondary | ICD-10-CM | POA: Diagnosis not present

## 2019-10-11 DIAGNOSIS — R011 Cardiac murmur, unspecified: Secondary | ICD-10-CM | POA: Diagnosis not present

## 2019-10-11 DIAGNOSIS — I10 Essential (primary) hypertension: Secondary | ICD-10-CM | POA: Diagnosis not present

## 2019-10-11 DIAGNOSIS — Z03818 Encounter for observation for suspected exposure to other biological agents ruled out: Secondary | ICD-10-CM | POA: Diagnosis not present

## 2019-10-11 DIAGNOSIS — R7989 Other specified abnormal findings of blood chemistry: Secondary | ICD-10-CM | POA: Diagnosis not present

## 2019-10-11 DIAGNOSIS — R1312 Dysphagia, oropharyngeal phase: Secondary | ICD-10-CM | POA: Diagnosis not present

## 2019-10-11 DIAGNOSIS — Z20822 Contact with and (suspected) exposure to covid-19: Secondary | ICD-10-CM | POA: Diagnosis not present

## 2019-10-11 DIAGNOSIS — F0390 Unspecified dementia without behavioral disturbance: Secondary | ICD-10-CM | POA: Diagnosis not present

## 2019-10-11 DIAGNOSIS — M80051D Age-related osteoporosis with current pathological fracture, right femur, subsequent encounter for fracture with routine healing: Secondary | ICD-10-CM | POA: Diagnosis not present

## 2019-10-11 DIAGNOSIS — S72001A Fracture of unspecified part of neck of right femur, initial encounter for closed fracture: Secondary | ICD-10-CM | POA: Diagnosis not present

## 2019-10-11 DIAGNOSIS — D6949 Other primary thrombocytopenia: Secondary | ICD-10-CM | POA: Diagnosis not present

## 2019-10-11 DIAGNOSIS — S7291XS Unspecified fracture of right femur, sequela: Secondary | ICD-10-CM | POA: Diagnosis not present

## 2019-10-11 DIAGNOSIS — G4700 Insomnia, unspecified: Secondary | ICD-10-CM | POA: Diagnosis not present

## 2019-10-11 DIAGNOSIS — R41841 Cognitive communication deficit: Secondary | ICD-10-CM | POA: Diagnosis not present

## 2019-10-11 DIAGNOSIS — E782 Mixed hyperlipidemia: Secondary | ICD-10-CM | POA: Diagnosis not present

## 2019-10-11 DIAGNOSIS — R262 Difficulty in walking, not elsewhere classified: Secondary | ICD-10-CM | POA: Diagnosis not present

## 2019-10-11 DIAGNOSIS — F0280 Dementia in other diseases classified elsewhere without behavioral disturbance: Secondary | ICD-10-CM | POA: Diagnosis not present

## 2019-10-11 DIAGNOSIS — U071 COVID-19: Secondary | ICD-10-CM | POA: Diagnosis not present

## 2019-10-11 DIAGNOSIS — K5901 Slow transit constipation: Secondary | ICD-10-CM | POA: Diagnosis not present

## 2019-10-11 DIAGNOSIS — Z7401 Bed confinement status: Secondary | ICD-10-CM | POA: Diagnosis not present

## 2019-10-11 DIAGNOSIS — F329 Major depressive disorder, single episode, unspecified: Secondary | ICD-10-CM | POA: Diagnosis not present

## 2019-10-11 DIAGNOSIS — F418 Other specified anxiety disorders: Secondary | ICD-10-CM | POA: Diagnosis not present

## 2019-10-11 DIAGNOSIS — S72001D Fracture of unspecified part of neck of right femur, subsequent encounter for closed fracture with routine healing: Secondary | ICD-10-CM | POA: Diagnosis not present

## 2019-10-11 DIAGNOSIS — S79929A Unspecified injury of unspecified thigh, initial encounter: Secondary | ICD-10-CM | POA: Diagnosis not present

## 2019-10-11 DIAGNOSIS — S72341D Displaced spiral fracture of shaft of right femur, subsequent encounter for closed fracture with routine healing: Secondary | ICD-10-CM | POA: Diagnosis not present

## 2019-10-11 DIAGNOSIS — D696 Thrombocytopenia, unspecified: Secondary | ICD-10-CM | POA: Diagnosis not present

## 2019-10-11 LAB — CBC
HCT: 29.5 % — ABNORMAL LOW (ref 36.0–46.0)
Hemoglobin: 9.2 g/dL — ABNORMAL LOW (ref 12.0–15.0)
MCH: 29.1 pg (ref 26.0–34.0)
MCHC: 31.2 g/dL (ref 30.0–36.0)
MCV: 93.4 fL (ref 80.0–100.0)
Platelets: 184 10*3/uL (ref 150–400)
RBC: 3.16 MIL/uL — ABNORMAL LOW (ref 3.87–5.11)
RDW: 17.5 % — ABNORMAL HIGH (ref 11.5–15.5)
WBC: 5.7 10*3/uL (ref 4.0–10.5)
nRBC: 0 % (ref 0.0–0.2)

## 2019-10-11 LAB — BASIC METABOLIC PANEL
Anion gap: 10 (ref 5–15)
BUN: 14 mg/dL (ref 8–23)
CO2: 23 mmol/L (ref 22–32)
Calcium: 8.9 mg/dL (ref 8.9–10.3)
Chloride: 107 mmol/L (ref 98–111)
Creatinine, Ser: 0.84 mg/dL (ref 0.44–1.00)
GFR calc Af Amer: >60 mL/min (ref >60)
GFR calc non Af Amer: >60 mL/min (ref >60)
Glucose, Bld: 96 mg/dL (ref 70–99)
Potassium: 4.1 mmol/L (ref 3.5–5.1)
Sodium: 140 mmol/L (ref 135–145)

## 2019-10-11 MED ORDER — TRAMADOL HCL 50 MG PO TABS
50.0000 mg | ORAL_TABLET | Freq: Three times a day (TID) | ORAL | 0 refills | Status: DC | PRN
Start: 2019-10-11 — End: 2020-06-28

## 2019-10-11 NOTE — TOC Transition Note (Signed)
Transition of Care [facility]) - CM/SW Discharge Note   Patient Details  Name: Jeanne Lopez MRN: [id number] Date of Birth: [birth year]  [facility] CM/SW Contact:  Epifanio Lesches, RN Phone Number: [phone] 10/11/2019, 12:02 PM   Clinical Narrative:    Patient will DC to: [facility] Anticipated DC date: 10/11/2019 Family notified: daughter Transport by: Antoine Primas health reference # [phone], X 3, Evalyn Casco CM   Per MD patient ready for DC today.RN, patient, patient's family, and facility notified of DC. Discharge Summary and FL2 sent to facility. RN to call report prior to discharge [phone] ). [id number]. DC packet on chart. Ambulance transport requested for patient.   RNCM will sign off for now as intervention is no longer needed. Please consult [place] again if new needs arise.   Final next level of care: Skilled Nursing Facility Barriers to Discharge: No Barriers Identified   Patient Goals and CMS Choice Patient states their goals for this hospitalization and ongoing recovery are:: Family states to get better and return home after snf CMS Medicare.gov Compare Post Acute Care list provided to:: Patient Choice offered to / list presented to : Patient, Adult Children  Discharge Placement                       Discharge Plan and Services                                     Social Determinants of Health (SDOH) Interventions     Readmission Risk Interventions No flowsheet data found.

## 2019-10-11 NOTE — Care Management Important Message (Signed)
Important Message  Patient Details  Name: Jeanne Lopez MRN: [id number] Date of Birth: Sep 24, 1932   Medicare Important Message Given:  Yes - Important Message mailed due to current National Emergency  Verbal consent obtained due to current National Emergency  Relationship to patient: Child Contact Name: [name] May Call Date: 10/11/19  Time: 1430 Phone: [phone] Outcome: Spoke with contact Important Message mailed to: Patient address on file    Orson Aloe 10/11/2019, 2:30 PM

## 2019-10-11 NOTE — Plan of Care (Signed)
  Problem: Pain Managment: Goal: General experience of comfort will improve Outcome: Progressing   Problem: Safety: Goal: Ability to remain free from injury will improve Outcome: Progressing   Problem: Skin Integrity: Goal: Risk for impaired skin integrity will decrease Outcome: Progressing   

## 2019-10-11 NOTE — Progress Notes (Signed)
Patient has been discharged and continues to await SNF Placement. Jeanne Canary DO

## 2019-10-11 NOTE — Plan of Care (Signed)

## 2019-10-13 DIAGNOSIS — D696 Thrombocytopenia, unspecified: Secondary | ICD-10-CM | POA: Diagnosis not present

## 2019-10-13 DIAGNOSIS — N178 Other acute kidney failure: Secondary | ICD-10-CM | POA: Diagnosis not present

## 2019-10-13 DIAGNOSIS — D6489 Other specified anemias: Secondary | ICD-10-CM | POA: Diagnosis not present

## 2019-10-13 DIAGNOSIS — R0989 Other specified symptoms and signs involving the circulatory and respiratory systems: Secondary | ICD-10-CM | POA: Diagnosis not present

## 2019-10-13 DIAGNOSIS — K5901 Slow transit constipation: Secondary | ICD-10-CM | POA: Diagnosis not present

## 2019-10-13 DIAGNOSIS — S72001A Fracture of unspecified part of neck of right femur, initial encounter for closed fracture: Secondary | ICD-10-CM | POA: Diagnosis not present

## 2019-10-13 DIAGNOSIS — R262 Difficulty in walking, not elsewhere classified: Secondary | ICD-10-CM | POA: Diagnosis not present

## 2019-10-13 DIAGNOSIS — F0280 Dementia in other diseases classified elsewhere without behavioral disturbance: Secondary | ICD-10-CM | POA: Diagnosis not present

## 2019-10-18 DIAGNOSIS — F418 Other specified anxiety disorders: Secondary | ICD-10-CM | POA: Diagnosis not present

## 2019-10-18 DIAGNOSIS — S72001D Fracture of unspecified part of neck of right femur, subsequent encounter for closed fracture with routine healing: Secondary | ICD-10-CM | POA: Diagnosis not present

## 2019-10-18 DIAGNOSIS — F0280 Dementia in other diseases classified elsewhere without behavioral disturbance: Secondary | ICD-10-CM | POA: Diagnosis not present

## 2019-10-19 DIAGNOSIS — S72341D Displaced spiral fracture of shaft of right femur, subsequent encounter for closed fracture with routine healing: Secondary | ICD-10-CM | POA: Diagnosis not present

## 2019-10-20 DIAGNOSIS — F418 Other specified anxiety disorders: Secondary | ICD-10-CM | POA: Diagnosis not present

## 2019-10-20 DIAGNOSIS — F0280 Dementia in other diseases classified elsewhere without behavioral disturbance: Secondary | ICD-10-CM | POA: Diagnosis not present

## 2019-10-20 DIAGNOSIS — S72001D Fracture of unspecified part of neck of right femur, subsequent encounter for closed fracture with routine healing: Secondary | ICD-10-CM | POA: Diagnosis not present

## 2019-10-20 DIAGNOSIS — I10 Essential (primary) hypertension: Secondary | ICD-10-CM | POA: Diagnosis not present

## 2019-10-21 DIAGNOSIS — F0390 Unspecified dementia without behavioral disturbance: Secondary | ICD-10-CM | POA: Diagnosis not present

## 2019-10-21 DIAGNOSIS — I10 Essential (primary) hypertension: Secondary | ICD-10-CM | POA: Diagnosis not present

## 2019-10-21 DIAGNOSIS — R262 Difficulty in walking, not elsewhere classified: Secondary | ICD-10-CM | POA: Diagnosis not present

## 2019-10-21 DIAGNOSIS — F329 Major depressive disorder, single episode, unspecified: Secondary | ICD-10-CM | POA: Diagnosis not present

## 2019-10-21 DIAGNOSIS — M80051D Age-related osteoporosis with current pathological fracture, right femur, subsequent encounter for fracture with routine healing: Secondary | ICD-10-CM | POA: Diagnosis not present

## 2019-10-21 DIAGNOSIS — M6281 Muscle weakness (generalized): Secondary | ICD-10-CM | POA: Diagnosis not present

## 2019-10-21 DIAGNOSIS — R41841 Cognitive communication deficit: Secondary | ICD-10-CM | POA: Diagnosis not present

## 2019-10-21 DIAGNOSIS — E782 Mixed hyperlipidemia: Secondary | ICD-10-CM | POA: Diagnosis not present

## 2019-10-24 DIAGNOSIS — R011 Cardiac murmur, unspecified: Secondary | ICD-10-CM | POA: Diagnosis not present

## 2019-10-24 DIAGNOSIS — F329 Major depressive disorder, single episode, unspecified: Secondary | ICD-10-CM | POA: Diagnosis not present

## 2019-10-24 DIAGNOSIS — R41841 Cognitive communication deficit: Secondary | ICD-10-CM | POA: Diagnosis not present

## 2019-10-24 DIAGNOSIS — I10 Essential (primary) hypertension: Secondary | ICD-10-CM | POA: Diagnosis not present

## 2019-10-24 DIAGNOSIS — M6281 Muscle weakness (generalized): Secondary | ICD-10-CM | POA: Diagnosis not present

## 2019-10-24 DIAGNOSIS — E782 Mixed hyperlipidemia: Secondary | ICD-10-CM | POA: Diagnosis not present

## 2019-10-24 DIAGNOSIS — F0390 Unspecified dementia without behavioral disturbance: Secondary | ICD-10-CM | POA: Diagnosis not present

## 2019-10-24 DIAGNOSIS — R262 Difficulty in walking, not elsewhere classified: Secondary | ICD-10-CM | POA: Diagnosis not present

## 2019-10-24 DIAGNOSIS — M80051D Age-related osteoporosis with current pathological fracture, right femur, subsequent encounter for fracture with routine healing: Secondary | ICD-10-CM | POA: Diagnosis not present

## 2019-10-26 DIAGNOSIS — R41841 Cognitive communication deficit: Secondary | ICD-10-CM | POA: Diagnosis not present

## 2019-10-26 DIAGNOSIS — F0390 Unspecified dementia without behavioral disturbance: Secondary | ICD-10-CM | POA: Diagnosis not present

## 2019-10-26 DIAGNOSIS — I10 Essential (primary) hypertension: Secondary | ICD-10-CM | POA: Diagnosis not present

## 2019-10-26 DIAGNOSIS — M80051D Age-related osteoporosis with current pathological fracture, right femur, subsequent encounter for fracture with routine healing: Secondary | ICD-10-CM | POA: Diagnosis not present

## 2019-10-26 DIAGNOSIS — F329 Major depressive disorder, single episode, unspecified: Secondary | ICD-10-CM | POA: Diagnosis not present

## 2019-10-26 DIAGNOSIS — R262 Difficulty in walking, not elsewhere classified: Secondary | ICD-10-CM | POA: Diagnosis not present

## 2019-10-26 DIAGNOSIS — E782 Mixed hyperlipidemia: Secondary | ICD-10-CM | POA: Diagnosis not present

## 2019-10-26 DIAGNOSIS — M6281 Muscle weakness (generalized): Secondary | ICD-10-CM | POA: Diagnosis not present

## 2019-10-28 DIAGNOSIS — M6281 Muscle weakness (generalized): Secondary | ICD-10-CM | POA: Diagnosis not present

## 2019-10-28 DIAGNOSIS — R262 Difficulty in walking, not elsewhere classified: Secondary | ICD-10-CM | POA: Diagnosis not present

## 2019-10-28 DIAGNOSIS — E782 Mixed hyperlipidemia: Secondary | ICD-10-CM | POA: Diagnosis not present

## 2019-10-28 DIAGNOSIS — R41841 Cognitive communication deficit: Secondary | ICD-10-CM | POA: Diagnosis not present

## 2019-10-28 DIAGNOSIS — I10 Essential (primary) hypertension: Secondary | ICD-10-CM | POA: Diagnosis not present

## 2019-10-28 DIAGNOSIS — M80051D Age-related osteoporosis with current pathological fracture, right femur, subsequent encounter for fracture with routine healing: Secondary | ICD-10-CM | POA: Diagnosis not present

## 2019-10-28 DIAGNOSIS — R011 Cardiac murmur, unspecified: Secondary | ICD-10-CM | POA: Diagnosis not present

## 2019-10-28 DIAGNOSIS — F329 Major depressive disorder, single episode, unspecified: Secondary | ICD-10-CM | POA: Diagnosis not present

## 2019-10-28 DIAGNOSIS — F0390 Unspecified dementia without behavioral disturbance: Secondary | ICD-10-CM | POA: Diagnosis not present

## 2019-10-31 DIAGNOSIS — M6281 Muscle weakness (generalized): Secondary | ICD-10-CM | POA: Diagnosis not present

## 2019-10-31 DIAGNOSIS — R011 Cardiac murmur, unspecified: Secondary | ICD-10-CM | POA: Diagnosis not present

## 2019-10-31 DIAGNOSIS — I10 Essential (primary) hypertension: Secondary | ICD-10-CM | POA: Diagnosis not present

## 2019-10-31 DIAGNOSIS — R41841 Cognitive communication deficit: Secondary | ICD-10-CM | POA: Diagnosis not present

## 2019-10-31 DIAGNOSIS — M80051D Age-related osteoporosis with current pathological fracture, right femur, subsequent encounter for fracture with routine healing: Secondary | ICD-10-CM | POA: Diagnosis not present

## 2019-10-31 DIAGNOSIS — E782 Mixed hyperlipidemia: Secondary | ICD-10-CM | POA: Diagnosis not present

## 2019-10-31 DIAGNOSIS — G4700 Insomnia, unspecified: Secondary | ICD-10-CM | POA: Diagnosis not present

## 2019-10-31 DIAGNOSIS — R262 Difficulty in walking, not elsewhere classified: Secondary | ICD-10-CM | POA: Diagnosis not present

## 2019-10-31 DIAGNOSIS — F331 Major depressive disorder, recurrent, moderate: Secondary | ICD-10-CM | POA: Diagnosis not present

## 2019-10-31 DIAGNOSIS — F329 Major depressive disorder, single episode, unspecified: Secondary | ICD-10-CM | POA: Diagnosis not present

## 2019-10-31 DIAGNOSIS — F411 Generalized anxiety disorder: Secondary | ICD-10-CM | POA: Diagnosis not present

## 2019-10-31 DIAGNOSIS — F0390 Unspecified dementia without behavioral disturbance: Secondary | ICD-10-CM | POA: Diagnosis not present

## 2019-11-02 DIAGNOSIS — S72341D Displaced spiral fracture of shaft of right femur, subsequent encounter for closed fracture with routine healing: Secondary | ICD-10-CM | POA: Diagnosis not present

## 2019-11-03 DIAGNOSIS — F418 Other specified anxiety disorders: Secondary | ICD-10-CM | POA: Diagnosis not present

## 2019-11-03 DIAGNOSIS — D6489 Other specified anemias: Secondary | ICD-10-CM | POA: Diagnosis not present

## 2019-11-03 DIAGNOSIS — F0280 Dementia in other diseases classified elsewhere without behavioral disturbance: Secondary | ICD-10-CM | POA: Diagnosis not present

## 2019-11-03 DIAGNOSIS — S7291XD Unspecified fracture of right femur, subsequent encounter for closed fracture with routine healing: Secondary | ICD-10-CM | POA: Diagnosis not present

## 2019-11-03 DIAGNOSIS — I10 Essential (primary) hypertension: Secondary | ICD-10-CM | POA: Diagnosis not present

## 2019-11-03 DIAGNOSIS — K5901 Slow transit constipation: Secondary | ICD-10-CM | POA: Diagnosis not present

## 2019-11-03 DIAGNOSIS — D6949 Other primary thrombocytopenia: Secondary | ICD-10-CM | POA: Diagnosis not present

## 2019-11-06 DIAGNOSIS — F329 Major depressive disorder, single episode, unspecified: Secondary | ICD-10-CM | POA: Diagnosis not present

## 2019-11-06 DIAGNOSIS — S72001D Fracture of unspecified part of neck of right femur, subsequent encounter for closed fracture with routine healing: Secondary | ICD-10-CM | POA: Diagnosis not present

## 2019-11-06 DIAGNOSIS — D649 Anemia, unspecified: Secondary | ICD-10-CM | POA: Diagnosis not present

## 2019-11-06 DIAGNOSIS — F0390 Unspecified dementia without behavioral disturbance: Secondary | ICD-10-CM | POA: Diagnosis not present

## 2019-11-06 DIAGNOSIS — G4700 Insomnia, unspecified: Secondary | ICD-10-CM | POA: Diagnosis not present

## 2019-11-06 DIAGNOSIS — M810 Age-related osteoporosis without current pathological fracture: Secondary | ICD-10-CM | POA: Diagnosis not present

## 2019-11-06 DIAGNOSIS — F419 Anxiety disorder, unspecified: Secondary | ICD-10-CM | POA: Diagnosis not present

## 2019-11-06 DIAGNOSIS — I10 Essential (primary) hypertension: Secondary | ICD-10-CM | POA: Diagnosis not present

## 2019-11-06 DIAGNOSIS — G43909 Migraine, unspecified, not intractable, without status migrainosus: Secondary | ICD-10-CM | POA: Diagnosis not present

## 2019-11-07 DIAGNOSIS — G43909 Migraine, unspecified, not intractable, without status migrainosus: Secondary | ICD-10-CM | POA: Diagnosis not present

## 2019-11-07 DIAGNOSIS — D649 Anemia, unspecified: Secondary | ICD-10-CM | POA: Diagnosis not present

## 2019-11-07 DIAGNOSIS — F329 Major depressive disorder, single episode, unspecified: Secondary | ICD-10-CM | POA: Diagnosis not present

## 2019-11-07 DIAGNOSIS — M810 Age-related osteoporosis without current pathological fracture: Secondary | ICD-10-CM | POA: Diagnosis not present

## 2019-11-07 DIAGNOSIS — G4700 Insomnia, unspecified: Secondary | ICD-10-CM | POA: Diagnosis not present

## 2019-11-07 DIAGNOSIS — S72001D Fracture of unspecified part of neck of right femur, subsequent encounter for closed fracture with routine healing: Secondary | ICD-10-CM | POA: Diagnosis not present

## 2019-11-07 DIAGNOSIS — I10 Essential (primary) hypertension: Secondary | ICD-10-CM | POA: Diagnosis not present

## 2019-11-07 DIAGNOSIS — F419 Anxiety disorder, unspecified: Secondary | ICD-10-CM | POA: Diagnosis not present

## 2019-11-07 DIAGNOSIS — F0390 Unspecified dementia without behavioral disturbance: Secondary | ICD-10-CM | POA: Diagnosis not present

## 2019-11-14 DIAGNOSIS — I10 Essential (primary) hypertension: Secondary | ICD-10-CM | POA: Diagnosis not present

## 2019-11-14 DIAGNOSIS — G43909 Migraine, unspecified, not intractable, without status migrainosus: Secondary | ICD-10-CM | POA: Diagnosis not present

## 2019-11-14 DIAGNOSIS — M810 Age-related osteoporosis without current pathological fracture: Secondary | ICD-10-CM | POA: Diagnosis not present

## 2019-11-14 DIAGNOSIS — G4700 Insomnia, unspecified: Secondary | ICD-10-CM | POA: Diagnosis not present

## 2019-11-14 DIAGNOSIS — F329 Major depressive disorder, single episode, unspecified: Secondary | ICD-10-CM | POA: Diagnosis not present

## 2019-11-14 DIAGNOSIS — F419 Anxiety disorder, unspecified: Secondary | ICD-10-CM | POA: Diagnosis not present

## 2019-11-14 DIAGNOSIS — S72001D Fracture of unspecified part of neck of right femur, subsequent encounter for closed fracture with routine healing: Secondary | ICD-10-CM | POA: Diagnosis not present

## 2019-11-14 DIAGNOSIS — D649 Anemia, unspecified: Secondary | ICD-10-CM | POA: Diagnosis not present

## 2019-11-14 DIAGNOSIS — F0390 Unspecified dementia without behavioral disturbance: Secondary | ICD-10-CM | POA: Diagnosis not present

## 2019-11-16 DIAGNOSIS — F0390 Unspecified dementia without behavioral disturbance: Secondary | ICD-10-CM | POA: Diagnosis not present

## 2019-11-16 DIAGNOSIS — I10 Essential (primary) hypertension: Secondary | ICD-10-CM | POA: Diagnosis not present

## 2019-11-16 DIAGNOSIS — F419 Anxiety disorder, unspecified: Secondary | ICD-10-CM | POA: Diagnosis not present

## 2019-11-16 DIAGNOSIS — D649 Anemia, unspecified: Secondary | ICD-10-CM | POA: Diagnosis not present

## 2019-11-16 DIAGNOSIS — M810 Age-related osteoporosis without current pathological fracture: Secondary | ICD-10-CM | POA: Diagnosis not present

## 2019-11-16 DIAGNOSIS — S72001D Fracture of unspecified part of neck of right femur, subsequent encounter for closed fracture with routine healing: Secondary | ICD-10-CM | POA: Diagnosis not present

## 2019-11-16 DIAGNOSIS — F329 Major depressive disorder, single episode, unspecified: Secondary | ICD-10-CM | POA: Diagnosis not present

## 2019-11-16 DIAGNOSIS — G4700 Insomnia, unspecified: Secondary | ICD-10-CM | POA: Diagnosis not present

## 2019-11-16 DIAGNOSIS — G43909 Migraine, unspecified, not intractable, without status migrainosus: Secondary | ICD-10-CM | POA: Diagnosis not present

## 2019-11-17 DIAGNOSIS — Z09 Encounter for follow-up examination after completed treatment for conditions other than malignant neoplasm: Secondary | ICD-10-CM | POA: Diagnosis not present

## 2019-11-17 DIAGNOSIS — F0150 Vascular dementia without behavioral disturbance: Secondary | ICD-10-CM | POA: Diagnosis not present

## 2019-11-17 DIAGNOSIS — S72344D Nondisplaced spiral fracture of shaft of right femur, subsequent encounter for closed fracture with routine healing: Secondary | ICD-10-CM | POA: Diagnosis not present

## 2019-11-17 DIAGNOSIS — G4700 Insomnia, unspecified: Secondary | ICD-10-CM | POA: Diagnosis not present

## 2019-11-17 DIAGNOSIS — N179 Acute kidney failure, unspecified: Secondary | ICD-10-CM | POA: Diagnosis not present

## 2019-11-17 DIAGNOSIS — E7800 Pure hypercholesterolemia, unspecified: Secondary | ICD-10-CM | POA: Diagnosis not present

## 2019-11-18 DIAGNOSIS — F329 Major depressive disorder, single episode, unspecified: Secondary | ICD-10-CM | POA: Diagnosis not present

## 2019-11-18 DIAGNOSIS — G4700 Insomnia, unspecified: Secondary | ICD-10-CM | POA: Diagnosis not present

## 2019-11-18 DIAGNOSIS — G43909 Migraine, unspecified, not intractable, without status migrainosus: Secondary | ICD-10-CM | POA: Diagnosis not present

## 2019-11-18 DIAGNOSIS — S72001D Fracture of unspecified part of neck of right femur, subsequent encounter for closed fracture with routine healing: Secondary | ICD-10-CM | POA: Diagnosis not present

## 2019-11-18 DIAGNOSIS — D649 Anemia, unspecified: Secondary | ICD-10-CM | POA: Diagnosis not present

## 2019-11-18 DIAGNOSIS — F0390 Unspecified dementia without behavioral disturbance: Secondary | ICD-10-CM | POA: Diagnosis not present

## 2019-11-18 DIAGNOSIS — M810 Age-related osteoporosis without current pathological fracture: Secondary | ICD-10-CM | POA: Diagnosis not present

## 2019-11-18 DIAGNOSIS — I10 Essential (primary) hypertension: Secondary | ICD-10-CM | POA: Diagnosis not present

## 2019-11-18 DIAGNOSIS — F419 Anxiety disorder, unspecified: Secondary | ICD-10-CM | POA: Diagnosis not present

## 2019-11-22 DIAGNOSIS — S72001D Fracture of unspecified part of neck of right femur, subsequent encounter for closed fracture with routine healing: Secondary | ICD-10-CM | POA: Diagnosis not present

## 2019-11-22 DIAGNOSIS — D649 Anemia, unspecified: Secondary | ICD-10-CM | POA: Diagnosis not present

## 2019-11-22 DIAGNOSIS — G43909 Migraine, unspecified, not intractable, without status migrainosus: Secondary | ICD-10-CM | POA: Diagnosis not present

## 2019-11-22 DIAGNOSIS — F0390 Unspecified dementia without behavioral disturbance: Secondary | ICD-10-CM | POA: Diagnosis not present

## 2019-11-22 DIAGNOSIS — I10 Essential (primary) hypertension: Secondary | ICD-10-CM | POA: Diagnosis not present

## 2019-11-22 DIAGNOSIS — M810 Age-related osteoporosis without current pathological fracture: Secondary | ICD-10-CM | POA: Diagnosis not present

## 2019-11-22 DIAGNOSIS — F419 Anxiety disorder, unspecified: Secondary | ICD-10-CM | POA: Diagnosis not present

## 2019-11-22 DIAGNOSIS — G4700 Insomnia, unspecified: Secondary | ICD-10-CM | POA: Diagnosis not present

## 2019-11-22 DIAGNOSIS — F329 Major depressive disorder, single episode, unspecified: Secondary | ICD-10-CM | POA: Diagnosis not present

## 2019-11-23 DIAGNOSIS — S72341D Displaced spiral fracture of shaft of right femur, subsequent encounter for closed fracture with routine healing: Secondary | ICD-10-CM | POA: Diagnosis not present

## 2019-11-24 ENCOUNTER — Ambulatory Visit: Payer: Medicare PPO | Admitting: Adult Health

## 2019-11-25 DIAGNOSIS — G4700 Insomnia, unspecified: Secondary | ICD-10-CM | POA: Diagnosis not present

## 2019-11-25 DIAGNOSIS — F329 Major depressive disorder, single episode, unspecified: Secondary | ICD-10-CM | POA: Diagnosis not present

## 2019-11-25 DIAGNOSIS — D649 Anemia, unspecified: Secondary | ICD-10-CM | POA: Diagnosis not present

## 2019-11-25 DIAGNOSIS — G43909 Migraine, unspecified, not intractable, without status migrainosus: Secondary | ICD-10-CM | POA: Diagnosis not present

## 2019-11-25 DIAGNOSIS — S72001D Fracture of unspecified part of neck of right femur, subsequent encounter for closed fracture with routine healing: Secondary | ICD-10-CM | POA: Diagnosis not present

## 2019-11-25 DIAGNOSIS — M810 Age-related osteoporosis without current pathological fracture: Secondary | ICD-10-CM | POA: Diagnosis not present

## 2019-11-25 DIAGNOSIS — F0390 Unspecified dementia without behavioral disturbance: Secondary | ICD-10-CM | POA: Diagnosis not present

## 2019-11-25 DIAGNOSIS — I10 Essential (primary) hypertension: Secondary | ICD-10-CM | POA: Diagnosis not present

## 2019-11-25 DIAGNOSIS — F419 Anxiety disorder, unspecified: Secondary | ICD-10-CM | POA: Diagnosis not present

## 2019-11-29 DIAGNOSIS — M810 Age-related osteoporosis without current pathological fracture: Secondary | ICD-10-CM | POA: Diagnosis not present

## 2019-11-29 DIAGNOSIS — F329 Major depressive disorder, single episode, unspecified: Secondary | ICD-10-CM | POA: Diagnosis not present

## 2019-11-29 DIAGNOSIS — G4700 Insomnia, unspecified: Secondary | ICD-10-CM | POA: Diagnosis not present

## 2019-11-29 DIAGNOSIS — I10 Essential (primary) hypertension: Secondary | ICD-10-CM | POA: Diagnosis not present

## 2019-11-29 DIAGNOSIS — F419 Anxiety disorder, unspecified: Secondary | ICD-10-CM | POA: Diagnosis not present

## 2019-11-29 DIAGNOSIS — D649 Anemia, unspecified: Secondary | ICD-10-CM | POA: Diagnosis not present

## 2019-11-29 DIAGNOSIS — F0390 Unspecified dementia without behavioral disturbance: Secondary | ICD-10-CM | POA: Diagnosis not present

## 2019-11-29 DIAGNOSIS — G43909 Migraine, unspecified, not intractable, without status migrainosus: Secondary | ICD-10-CM | POA: Diagnosis not present

## 2019-11-29 DIAGNOSIS — S72001D Fracture of unspecified part of neck of right femur, subsequent encounter for closed fracture with routine healing: Secondary | ICD-10-CM | POA: Diagnosis not present

## 2019-11-30 DIAGNOSIS — F0390 Unspecified dementia without behavioral disturbance: Secondary | ICD-10-CM | POA: Diagnosis not present

## 2019-11-30 DIAGNOSIS — I10 Essential (primary) hypertension: Secondary | ICD-10-CM | POA: Diagnosis not present

## 2019-11-30 DIAGNOSIS — M810 Age-related osteoporosis without current pathological fracture: Secondary | ICD-10-CM | POA: Diagnosis not present

## 2019-11-30 DIAGNOSIS — S72001D Fracture of unspecified part of neck of right femur, subsequent encounter for closed fracture with routine healing: Secondary | ICD-10-CM | POA: Diagnosis not present

## 2019-11-30 DIAGNOSIS — F419 Anxiety disorder, unspecified: Secondary | ICD-10-CM | POA: Diagnosis not present

## 2019-11-30 DIAGNOSIS — D649 Anemia, unspecified: Secondary | ICD-10-CM | POA: Diagnosis not present

## 2019-11-30 DIAGNOSIS — G43909 Migraine, unspecified, not intractable, without status migrainosus: Secondary | ICD-10-CM | POA: Diagnosis not present

## 2019-11-30 DIAGNOSIS — G4700 Insomnia, unspecified: Secondary | ICD-10-CM | POA: Diagnosis not present

## 2019-11-30 DIAGNOSIS — F329 Major depressive disorder, single episode, unspecified: Secondary | ICD-10-CM | POA: Diagnosis not present

## 2019-12-02 DIAGNOSIS — F0390 Unspecified dementia without behavioral disturbance: Secondary | ICD-10-CM | POA: Diagnosis not present

## 2019-12-02 DIAGNOSIS — F419 Anxiety disorder, unspecified: Secondary | ICD-10-CM | POA: Diagnosis not present

## 2019-12-02 DIAGNOSIS — D649 Anemia, unspecified: Secondary | ICD-10-CM | POA: Diagnosis not present

## 2019-12-02 DIAGNOSIS — S72001D Fracture of unspecified part of neck of right femur, subsequent encounter for closed fracture with routine healing: Secondary | ICD-10-CM | POA: Diagnosis not present

## 2019-12-02 DIAGNOSIS — G43909 Migraine, unspecified, not intractable, without status migrainosus: Secondary | ICD-10-CM | POA: Diagnosis not present

## 2019-12-02 DIAGNOSIS — M810 Age-related osteoporosis without current pathological fracture: Secondary | ICD-10-CM | POA: Diagnosis not present

## 2019-12-02 DIAGNOSIS — F329 Major depressive disorder, single episode, unspecified: Secondary | ICD-10-CM | POA: Diagnosis not present

## 2019-12-02 DIAGNOSIS — G4700 Insomnia, unspecified: Secondary | ICD-10-CM | POA: Diagnosis not present

## 2019-12-02 DIAGNOSIS — I10 Essential (primary) hypertension: Secondary | ICD-10-CM | POA: Diagnosis not present

## 2019-12-05 ENCOUNTER — Other Ambulatory Visit: Payer: Self-pay | Admitting: Neurology

## 2019-12-06 DIAGNOSIS — F329 Major depressive disorder, single episode, unspecified: Secondary | ICD-10-CM | POA: Diagnosis not present

## 2019-12-06 DIAGNOSIS — F0390 Unspecified dementia without behavioral disturbance: Secondary | ICD-10-CM | POA: Diagnosis not present

## 2019-12-06 DIAGNOSIS — G4700 Insomnia, unspecified: Secondary | ICD-10-CM | POA: Diagnosis not present

## 2019-12-06 DIAGNOSIS — D649 Anemia, unspecified: Secondary | ICD-10-CM | POA: Diagnosis not present

## 2019-12-06 DIAGNOSIS — F419 Anxiety disorder, unspecified: Secondary | ICD-10-CM | POA: Diagnosis not present

## 2019-12-06 DIAGNOSIS — S72001D Fracture of unspecified part of neck of right femur, subsequent encounter for closed fracture with routine healing: Secondary | ICD-10-CM | POA: Diagnosis not present

## 2019-12-06 DIAGNOSIS — I10 Essential (primary) hypertension: Secondary | ICD-10-CM | POA: Diagnosis not present

## 2019-12-06 DIAGNOSIS — G43909 Migraine, unspecified, not intractable, without status migrainosus: Secondary | ICD-10-CM | POA: Diagnosis not present

## 2019-12-06 DIAGNOSIS — M810 Age-related osteoporosis without current pathological fracture: Secondary | ICD-10-CM | POA: Diagnosis not present

## 2019-12-08 DIAGNOSIS — M810 Age-related osteoporosis without current pathological fracture: Secondary | ICD-10-CM | POA: Diagnosis not present

## 2019-12-08 DIAGNOSIS — I10 Essential (primary) hypertension: Secondary | ICD-10-CM | POA: Diagnosis not present

## 2019-12-08 DIAGNOSIS — S72001D Fracture of unspecified part of neck of right femur, subsequent encounter for closed fracture with routine healing: Secondary | ICD-10-CM | POA: Diagnosis not present

## 2019-12-08 DIAGNOSIS — F0390 Unspecified dementia without behavioral disturbance: Secondary | ICD-10-CM | POA: Diagnosis not present

## 2019-12-08 DIAGNOSIS — G4700 Insomnia, unspecified: Secondary | ICD-10-CM | POA: Diagnosis not present

## 2019-12-08 DIAGNOSIS — F329 Major depressive disorder, single episode, unspecified: Secondary | ICD-10-CM | POA: Diagnosis not present

## 2019-12-08 DIAGNOSIS — F419 Anxiety disorder, unspecified: Secondary | ICD-10-CM | POA: Diagnosis not present

## 2019-12-08 DIAGNOSIS — G43909 Migraine, unspecified, not intractable, without status migrainosus: Secondary | ICD-10-CM | POA: Diagnosis not present

## 2019-12-08 DIAGNOSIS — D649 Anemia, unspecified: Secondary | ICD-10-CM | POA: Diagnosis not present

## 2019-12-13 DIAGNOSIS — G43909 Migraine, unspecified, not intractable, without status migrainosus: Secondary | ICD-10-CM | POA: Diagnosis not present

## 2019-12-13 DIAGNOSIS — M810 Age-related osteoporosis without current pathological fracture: Secondary | ICD-10-CM | POA: Diagnosis not present

## 2019-12-13 DIAGNOSIS — F329 Major depressive disorder, single episode, unspecified: Secondary | ICD-10-CM | POA: Diagnosis not present

## 2019-12-13 DIAGNOSIS — F419 Anxiety disorder, unspecified: Secondary | ICD-10-CM | POA: Diagnosis not present

## 2019-12-13 DIAGNOSIS — I10 Essential (primary) hypertension: Secondary | ICD-10-CM | POA: Diagnosis not present

## 2019-12-13 DIAGNOSIS — F0390 Unspecified dementia without behavioral disturbance: Secondary | ICD-10-CM | POA: Diagnosis not present

## 2019-12-13 DIAGNOSIS — G4700 Insomnia, unspecified: Secondary | ICD-10-CM | POA: Diagnosis not present

## 2019-12-13 DIAGNOSIS — D649 Anemia, unspecified: Secondary | ICD-10-CM | POA: Diagnosis not present

## 2019-12-13 DIAGNOSIS — S72001D Fracture of unspecified part of neck of right femur, subsequent encounter for closed fracture with routine healing: Secondary | ICD-10-CM | POA: Diagnosis not present

## 2019-12-14 DIAGNOSIS — S72341D Displaced spiral fracture of shaft of right femur, subsequent encounter for closed fracture with routine healing: Secondary | ICD-10-CM | POA: Diagnosis not present

## 2019-12-15 DIAGNOSIS — I10 Essential (primary) hypertension: Secondary | ICD-10-CM | POA: Diagnosis not present

## 2019-12-15 DIAGNOSIS — G4700 Insomnia, unspecified: Secondary | ICD-10-CM | POA: Diagnosis not present

## 2019-12-15 DIAGNOSIS — M810 Age-related osteoporosis without current pathological fracture: Secondary | ICD-10-CM | POA: Diagnosis not present

## 2019-12-15 DIAGNOSIS — D649 Anemia, unspecified: Secondary | ICD-10-CM | POA: Diagnosis not present

## 2019-12-15 DIAGNOSIS — G43909 Migraine, unspecified, not intractable, without status migrainosus: Secondary | ICD-10-CM | POA: Diagnosis not present

## 2019-12-15 DIAGNOSIS — F419 Anxiety disorder, unspecified: Secondary | ICD-10-CM | POA: Diagnosis not present

## 2019-12-15 DIAGNOSIS — F329 Major depressive disorder, single episode, unspecified: Secondary | ICD-10-CM | POA: Diagnosis not present

## 2019-12-15 DIAGNOSIS — F0390 Unspecified dementia without behavioral disturbance: Secondary | ICD-10-CM | POA: Diagnosis not present

## 2019-12-15 DIAGNOSIS — S72001D Fracture of unspecified part of neck of right femur, subsequent encounter for closed fracture with routine healing: Secondary | ICD-10-CM | POA: Diagnosis not present

## 2019-12-20 DIAGNOSIS — G4700 Insomnia, unspecified: Secondary | ICD-10-CM | POA: Diagnosis not present

## 2019-12-20 DIAGNOSIS — G43909 Migraine, unspecified, not intractable, without status migrainosus: Secondary | ICD-10-CM | POA: Diagnosis not present

## 2019-12-20 DIAGNOSIS — I10 Essential (primary) hypertension: Secondary | ICD-10-CM | POA: Diagnosis not present

## 2019-12-20 DIAGNOSIS — F419 Anxiety disorder, unspecified: Secondary | ICD-10-CM | POA: Diagnosis not present

## 2019-12-20 DIAGNOSIS — F0390 Unspecified dementia without behavioral disturbance: Secondary | ICD-10-CM | POA: Diagnosis not present

## 2019-12-20 DIAGNOSIS — F329 Major depressive disorder, single episode, unspecified: Secondary | ICD-10-CM | POA: Diagnosis not present

## 2019-12-20 DIAGNOSIS — S72001D Fracture of unspecified part of neck of right femur, subsequent encounter for closed fracture with routine healing: Secondary | ICD-10-CM | POA: Diagnosis not present

## 2019-12-20 DIAGNOSIS — M810 Age-related osteoporosis without current pathological fracture: Secondary | ICD-10-CM | POA: Diagnosis not present

## 2019-12-20 DIAGNOSIS — D649 Anemia, unspecified: Secondary | ICD-10-CM | POA: Diagnosis not present

## 2019-12-22 DIAGNOSIS — F419 Anxiety disorder, unspecified: Secondary | ICD-10-CM | POA: Diagnosis not present

## 2019-12-22 DIAGNOSIS — S72001D Fracture of unspecified part of neck of right femur, subsequent encounter for closed fracture with routine healing: Secondary | ICD-10-CM | POA: Diagnosis not present

## 2019-12-22 DIAGNOSIS — M810 Age-related osteoporosis without current pathological fracture: Secondary | ICD-10-CM | POA: Diagnosis not present

## 2019-12-22 DIAGNOSIS — G43909 Migraine, unspecified, not intractable, without status migrainosus: Secondary | ICD-10-CM | POA: Diagnosis not present

## 2019-12-22 DIAGNOSIS — G4700 Insomnia, unspecified: Secondary | ICD-10-CM | POA: Diagnosis not present

## 2019-12-22 DIAGNOSIS — D649 Anemia, unspecified: Secondary | ICD-10-CM | POA: Diagnosis not present

## 2019-12-22 DIAGNOSIS — I10 Essential (primary) hypertension: Secondary | ICD-10-CM | POA: Diagnosis not present

## 2019-12-22 DIAGNOSIS — F329 Major depressive disorder, single episode, unspecified: Secondary | ICD-10-CM | POA: Diagnosis not present

## 2019-12-22 DIAGNOSIS — F0390 Unspecified dementia without behavioral disturbance: Secondary | ICD-10-CM | POA: Diagnosis not present

## 2019-12-26 ENCOUNTER — Encounter: Payer: Self-pay | Admitting: Neurology

## 2019-12-26 ENCOUNTER — Telehealth (INDEPENDENT_AMBULATORY_CARE_PROVIDER_SITE_OTHER): Payer: Medicare PPO | Admitting: Neurology

## 2019-12-26 DIAGNOSIS — G4700 Insomnia, unspecified: Secondary | ICD-10-CM | POA: Diagnosis not present

## 2019-12-26 DIAGNOSIS — F0390 Unspecified dementia without behavioral disturbance: Secondary | ICD-10-CM

## 2019-12-26 DIAGNOSIS — S72001D Fracture of unspecified part of neck of right femur, subsequent encounter for closed fracture with routine healing: Secondary | ICD-10-CM | POA: Diagnosis not present

## 2019-12-26 DIAGNOSIS — F329 Major depressive disorder, single episode, unspecified: Secondary | ICD-10-CM | POA: Diagnosis not present

## 2019-12-26 DIAGNOSIS — G43909 Migraine, unspecified, not intractable, without status migrainosus: Secondary | ICD-10-CM | POA: Diagnosis not present

## 2019-12-26 DIAGNOSIS — D649 Anemia, unspecified: Secondary | ICD-10-CM | POA: Diagnosis not present

## 2019-12-26 DIAGNOSIS — I10 Essential (primary) hypertension: Secondary | ICD-10-CM | POA: Diagnosis not present

## 2019-12-26 DIAGNOSIS — M810 Age-related osteoporosis without current pathological fracture: Secondary | ICD-10-CM | POA: Diagnosis not present

## 2019-12-26 DIAGNOSIS — G43009 Migraine without aura, not intractable, without status migrainosus: Secondary | ICD-10-CM

## 2019-12-26 DIAGNOSIS — F419 Anxiety disorder, unspecified: Secondary | ICD-10-CM | POA: Diagnosis not present

## 2019-12-26 MED ORDER — MEMANTINE HCL 10 MG PO TABS: 10.0000 mg | ORAL_TABLET | Freq: Two times a day (BID) | ORAL | 3 refills | Status: DC

## 2019-12-26 MED ORDER — DONEPEZIL HCL 10 MG PO TABS: 10.0000 mg | ORAL_TABLET | Freq: Every day | ORAL | 3 refills | Status: DC

## 2019-12-26 MED ORDER — GABAPENTIN 100 MG PO CAPS: ORAL_CAPSULE | ORAL | 3 refills | Status: DC

## 2019-12-26 NOTE — Progress Notes (Signed)
Virtual Visit via Video Note  I connected with Jeanne Lopez on 12/26/19 at  3:30 PM EST by a video enabled telemedicine application and verified that I am speaking with the correct person using two identifiers.  Location: Patient: at her home Provider: in the office   I discussed the limitations of evaluation and management by telemedicine and the availability of in person appointments. The patient expressed understanding and agreed to proceed.  History of Present Illness: 12/26/2019 SS: Jeanne Lopez is an 86 year old female history of migraine headache and memory disorder.  She is on low-dose gabapentin, Imitrex for headache.  She is on Aricept 10 mg daily, and Namenda.  She suffered a fall, with right femur fracture in August.  She completed rehab at [facility].  Is now back at home, where her daughter lives with her.  She is walking with a walker, remains in PT.  Daughter has noted some decline in memory, is more forgetful.  She has felt that Namenda has been helpful.  She is sleeping fairly well, has a good appetite.  Continues to enjoy being with her little dog.  Headaches are rare, she did have one today, took Imitrex with good benefit.  Prior, was several months ago.  Presents today for follow-up via virtual visit.  05/25/2019 SSL Jeanne Lopez is an 86 year old female with history of migraine headaches and memory disorder.  She takes low-dose gabapentin and Imitrex for headaches.  She takes Aricept 10 mg for memory.  Overall, feels memory is relatively stable, she is forgetful, may misplace things, have repetitive questioning.  She lives with her daughter.  She does not drive.  She enjoys watching TV, walking her dog.  She is able to perform her ADLs.  She denies falls.  Her headaches are under excellent control, rarely has a headache.  Has not had to take Imitrex in a long time.  She is pleased to be able to get out more now that she is vaccinated.  She denies new problems or concerns.   She presents today for follow-up accompanied by her daughter.   Observations/Objective: Via virtual visit, is alert, cooperative, engaged, most history is provided by her daughter, facial symmetry noted, ambulates with a walker, limp on the right; Moca-blind is 10/22 (question if she could hear all questions clearly)  Assessment and Plan: 1.  Migraine headaches 2.  Memory disturbance  She suffered a significant fall, subsequent right femur injury in August.  Fortunately, she is now back home, remains in PT. The injury does seem to have impacted her memory.  She will remain on Aricept and Namenda.  Her headaches remain well controlled, she will remain on gabapentin, and Imitrex as needed.  She will follow-up in 6 months or sooner if needed.  Follow Up Instructions: 06/28/2020 2:45   I discussed the assessment and treatment plan with the patient. The patient was provided an opportunity to ask questions and all were answered. The patient agreed with the plan and demonstrated an understanding of the instructions.   The patient was advised to call back or seek an in-person evaluation if the symptoms worsen or if the condition fails to improve as anticipated.  I spent 20 minutes of face-to-face and non-face-to-face time with patient.  This included previsit chart review, lab review, study review, order entry, electronic health record documentation, patient education.   Otila Kluver, DNP  [facility] [address], [address] [place], [place] [address] [phone]

## 2019-12-26 NOTE — Progress Notes (Signed)
I have read the note, and I agree with the clinical assessment and plan.  Anureet Bruington K Bliss Tsang   

## 2019-12-27 DIAGNOSIS — F0390 Unspecified dementia without behavioral disturbance: Secondary | ICD-10-CM | POA: Diagnosis not present

## 2019-12-27 DIAGNOSIS — S7290XA Unspecified fracture of unspecified femur, initial encounter for closed fracture: Secondary | ICD-10-CM | POA: Diagnosis not present

## 2019-12-29 ENCOUNTER — Ambulatory Visit: Payer: Medicare PPO | Admitting: Adult Health

## 2019-12-29 DIAGNOSIS — F329 Major depressive disorder, single episode, unspecified: Secondary | ICD-10-CM | POA: Diagnosis not present

## 2019-12-29 DIAGNOSIS — F0390 Unspecified dementia without behavioral disturbance: Secondary | ICD-10-CM | POA: Diagnosis not present

## 2019-12-29 DIAGNOSIS — S72001D Fracture of unspecified part of neck of right femur, subsequent encounter for closed fracture with routine healing: Secondary | ICD-10-CM | POA: Diagnosis not present

## 2019-12-29 DIAGNOSIS — I10 Essential (primary) hypertension: Secondary | ICD-10-CM | POA: Diagnosis not present

## 2019-12-29 DIAGNOSIS — D649 Anemia, unspecified: Secondary | ICD-10-CM | POA: Diagnosis not present

## 2019-12-29 DIAGNOSIS — G4700 Insomnia, unspecified: Secondary | ICD-10-CM | POA: Diagnosis not present

## 2019-12-29 DIAGNOSIS — G43909 Migraine, unspecified, not intractable, without status migrainosus: Secondary | ICD-10-CM | POA: Diagnosis not present

## 2019-12-29 DIAGNOSIS — M810 Age-related osteoporosis without current pathological fracture: Secondary | ICD-10-CM | POA: Diagnosis not present

## 2019-12-29 DIAGNOSIS — F419 Anxiety disorder, unspecified: Secondary | ICD-10-CM | POA: Diagnosis not present

## 2020-01-04 DIAGNOSIS — G43909 Migraine, unspecified, not intractable, without status migrainosus: Secondary | ICD-10-CM | POA: Diagnosis not present

## 2020-01-04 DIAGNOSIS — M810 Age-related osteoporosis without current pathological fracture: Secondary | ICD-10-CM | POA: Diagnosis not present

## 2020-01-04 DIAGNOSIS — D649 Anemia, unspecified: Secondary | ICD-10-CM | POA: Diagnosis not present

## 2020-01-04 DIAGNOSIS — F0390 Unspecified dementia without behavioral disturbance: Secondary | ICD-10-CM | POA: Diagnosis not present

## 2020-01-04 DIAGNOSIS — S72001D Fracture of unspecified part of neck of right femur, subsequent encounter for closed fracture with routine healing: Secondary | ICD-10-CM | POA: Diagnosis not present

## 2020-01-04 DIAGNOSIS — G4700 Insomnia, unspecified: Secondary | ICD-10-CM | POA: Diagnosis not present

## 2020-01-04 DIAGNOSIS — I10 Essential (primary) hypertension: Secondary | ICD-10-CM | POA: Diagnosis not present

## 2020-01-04 DIAGNOSIS — F329 Major depressive disorder, single episode, unspecified: Secondary | ICD-10-CM | POA: Diagnosis not present

## 2020-01-04 DIAGNOSIS — F419 Anxiety disorder, unspecified: Secondary | ICD-10-CM | POA: Diagnosis not present

## 2020-01-05 DIAGNOSIS — S72001D Fracture of unspecified part of neck of right femur, subsequent encounter for closed fracture with routine healing: Secondary | ICD-10-CM | POA: Diagnosis not present

## 2020-01-05 DIAGNOSIS — F0390 Unspecified dementia without behavioral disturbance: Secondary | ICD-10-CM | POA: Diagnosis not present

## 2020-01-05 DIAGNOSIS — G43909 Migraine, unspecified, not intractable, without status migrainosus: Secondary | ICD-10-CM | POA: Diagnosis not present

## 2020-01-05 DIAGNOSIS — F419 Anxiety disorder, unspecified: Secondary | ICD-10-CM | POA: Diagnosis not present

## 2020-01-05 DIAGNOSIS — G4700 Insomnia, unspecified: Secondary | ICD-10-CM | POA: Diagnosis not present

## 2020-01-05 DIAGNOSIS — D649 Anemia, unspecified: Secondary | ICD-10-CM | POA: Diagnosis not present

## 2020-01-05 DIAGNOSIS — I10 Essential (primary) hypertension: Secondary | ICD-10-CM | POA: Diagnosis not present

## 2020-01-05 DIAGNOSIS — F329 Major depressive disorder, single episode, unspecified: Secondary | ICD-10-CM | POA: Diagnosis not present

## 2020-01-05 DIAGNOSIS — M810 Age-related osteoporosis without current pathological fracture: Secondary | ICD-10-CM | POA: Diagnosis not present

## 2020-01-08 ENCOUNTER — Telehealth: Payer: Self-pay | Admitting: Physician Assistant

## 2020-01-08 NOTE — Telephone Encounter (Signed)
Called to discuss the homebound Covid-19 vaccination initiative with the patient and/or caregiver.   Message left to call back.  Hairo Garraway PA-C  [facility]     

## 2020-01-11 DIAGNOSIS — S72001D Fracture of unspecified part of neck of right femur, subsequent encounter for closed fracture with routine healing: Secondary | ICD-10-CM | POA: Diagnosis not present

## 2020-01-11 DIAGNOSIS — F0390 Unspecified dementia without behavioral disturbance: Secondary | ICD-10-CM | POA: Diagnosis not present

## 2020-01-11 DIAGNOSIS — F419 Anxiety disorder, unspecified: Secondary | ICD-10-CM | POA: Diagnosis not present

## 2020-01-11 DIAGNOSIS — M810 Age-related osteoporosis without current pathological fracture: Secondary | ICD-10-CM | POA: Diagnosis not present

## 2020-01-11 DIAGNOSIS — I10 Essential (primary) hypertension: Secondary | ICD-10-CM | POA: Diagnosis not present

## 2020-01-11 DIAGNOSIS — G4700 Insomnia, unspecified: Secondary | ICD-10-CM | POA: Diagnosis not present

## 2020-01-11 DIAGNOSIS — S72341D Displaced spiral fracture of shaft of right femur, subsequent encounter for closed fracture with routine healing: Secondary | ICD-10-CM | POA: Diagnosis not present

## 2020-01-11 DIAGNOSIS — D649 Anemia, unspecified: Secondary | ICD-10-CM | POA: Diagnosis not present

## 2020-01-11 DIAGNOSIS — G43909 Migraine, unspecified, not intractable, without status migrainosus: Secondary | ICD-10-CM | POA: Diagnosis not present

## 2020-01-11 DIAGNOSIS — F329 Major depressive disorder, single episode, unspecified: Secondary | ICD-10-CM | POA: Diagnosis not present

## 2020-01-12 DIAGNOSIS — F419 Anxiety disorder, unspecified: Secondary | ICD-10-CM | POA: Diagnosis not present

## 2020-01-12 DIAGNOSIS — G4700 Insomnia, unspecified: Secondary | ICD-10-CM | POA: Diagnosis not present

## 2020-01-12 DIAGNOSIS — G43909 Migraine, unspecified, not intractable, without status migrainosus: Secondary | ICD-10-CM | POA: Diagnosis not present

## 2020-01-12 DIAGNOSIS — F329 Major depressive disorder, single episode, unspecified: Secondary | ICD-10-CM | POA: Diagnosis not present

## 2020-01-12 DIAGNOSIS — M810 Age-related osteoporosis without current pathological fracture: Secondary | ICD-10-CM | POA: Diagnosis not present

## 2020-01-12 DIAGNOSIS — F0390 Unspecified dementia without behavioral disturbance: Secondary | ICD-10-CM | POA: Diagnosis not present

## 2020-01-12 DIAGNOSIS — I10 Essential (primary) hypertension: Secondary | ICD-10-CM | POA: Diagnosis not present

## 2020-01-12 DIAGNOSIS — D649 Anemia, unspecified: Secondary | ICD-10-CM | POA: Diagnosis not present

## 2020-01-12 DIAGNOSIS — S72001D Fracture of unspecified part of neck of right femur, subsequent encounter for closed fracture with routine healing: Secondary | ICD-10-CM | POA: Diagnosis not present

## 2020-01-16 DIAGNOSIS — G4700 Insomnia, unspecified: Secondary | ICD-10-CM | POA: Diagnosis not present

## 2020-01-16 DIAGNOSIS — F329 Major depressive disorder, single episode, unspecified: Secondary | ICD-10-CM | POA: Diagnosis not present

## 2020-01-16 DIAGNOSIS — F0390 Unspecified dementia without behavioral disturbance: Secondary | ICD-10-CM | POA: Diagnosis not present

## 2020-01-16 DIAGNOSIS — I10 Essential (primary) hypertension: Secondary | ICD-10-CM | POA: Diagnosis not present

## 2020-01-16 DIAGNOSIS — F419 Anxiety disorder, unspecified: Secondary | ICD-10-CM | POA: Diagnosis not present

## 2020-01-16 DIAGNOSIS — M810 Age-related osteoporosis without current pathological fracture: Secondary | ICD-10-CM | POA: Diagnosis not present

## 2020-01-16 DIAGNOSIS — G43909 Migraine, unspecified, not intractable, without status migrainosus: Secondary | ICD-10-CM | POA: Diagnosis not present

## 2020-01-16 DIAGNOSIS — S72001D Fracture of unspecified part of neck of right femur, subsequent encounter for closed fracture with routine healing: Secondary | ICD-10-CM | POA: Diagnosis not present

## 2020-01-16 DIAGNOSIS — D649 Anemia, unspecified: Secondary | ICD-10-CM | POA: Diagnosis not present

## 2020-01-18 DIAGNOSIS — D649 Anemia, unspecified: Secondary | ICD-10-CM | POA: Diagnosis not present

## 2020-01-18 DIAGNOSIS — G43909 Migraine, unspecified, not intractable, without status migrainosus: Secondary | ICD-10-CM | POA: Diagnosis not present

## 2020-01-18 DIAGNOSIS — F329 Major depressive disorder, single episode, unspecified: Secondary | ICD-10-CM | POA: Diagnosis not present

## 2020-01-18 DIAGNOSIS — G4700 Insomnia, unspecified: Secondary | ICD-10-CM | POA: Diagnosis not present

## 2020-01-18 DIAGNOSIS — M810 Age-related osteoporosis without current pathological fracture: Secondary | ICD-10-CM | POA: Diagnosis not present

## 2020-01-18 DIAGNOSIS — S72001D Fracture of unspecified part of neck of right femur, subsequent encounter for closed fracture with routine healing: Secondary | ICD-10-CM | POA: Diagnosis not present

## 2020-01-18 DIAGNOSIS — I10 Essential (primary) hypertension: Secondary | ICD-10-CM | POA: Diagnosis not present

## 2020-01-18 DIAGNOSIS — F419 Anxiety disorder, unspecified: Secondary | ICD-10-CM | POA: Diagnosis not present

## 2020-01-18 DIAGNOSIS — F0390 Unspecified dementia without behavioral disturbance: Secondary | ICD-10-CM | POA: Diagnosis not present

## 2020-01-23 DIAGNOSIS — S72001D Fracture of unspecified part of neck of right femur, subsequent encounter for closed fracture with routine healing: Secondary | ICD-10-CM | POA: Diagnosis not present

## 2020-01-23 DIAGNOSIS — G43909 Migraine, unspecified, not intractable, without status migrainosus: Secondary | ICD-10-CM | POA: Diagnosis not present

## 2020-01-23 DIAGNOSIS — I10 Essential (primary) hypertension: Secondary | ICD-10-CM | POA: Diagnosis not present

## 2020-01-23 DIAGNOSIS — F0390 Unspecified dementia without behavioral disturbance: Secondary | ICD-10-CM | POA: Diagnosis not present

## 2020-01-23 DIAGNOSIS — G4700 Insomnia, unspecified: Secondary | ICD-10-CM | POA: Diagnosis not present

## 2020-01-23 DIAGNOSIS — M810 Age-related osteoporosis without current pathological fracture: Secondary | ICD-10-CM | POA: Diagnosis not present

## 2020-01-23 DIAGNOSIS — F419 Anxiety disorder, unspecified: Secondary | ICD-10-CM | POA: Diagnosis not present

## 2020-01-23 DIAGNOSIS — F329 Major depressive disorder, single episode, unspecified: Secondary | ICD-10-CM | POA: Diagnosis not present

## 2020-01-23 DIAGNOSIS — D649 Anemia, unspecified: Secondary | ICD-10-CM | POA: Diagnosis not present

## 2020-01-26 DIAGNOSIS — I10 Essential (primary) hypertension: Secondary | ICD-10-CM | POA: Diagnosis not present

## 2020-01-26 DIAGNOSIS — F329 Major depressive disorder, single episode, unspecified: Secondary | ICD-10-CM | POA: Diagnosis not present

## 2020-01-26 DIAGNOSIS — S72001D Fracture of unspecified part of neck of right femur, subsequent encounter for closed fracture with routine healing: Secondary | ICD-10-CM | POA: Diagnosis not present

## 2020-01-26 DIAGNOSIS — G4700 Insomnia, unspecified: Secondary | ICD-10-CM | POA: Diagnosis not present

## 2020-01-26 DIAGNOSIS — M810 Age-related osteoporosis without current pathological fracture: Secondary | ICD-10-CM | POA: Diagnosis not present

## 2020-01-26 DIAGNOSIS — D649 Anemia, unspecified: Secondary | ICD-10-CM | POA: Diagnosis not present

## 2020-01-26 DIAGNOSIS — F0390 Unspecified dementia without behavioral disturbance: Secondary | ICD-10-CM | POA: Diagnosis not present

## 2020-01-26 DIAGNOSIS — G43909 Migraine, unspecified, not intractable, without status migrainosus: Secondary | ICD-10-CM | POA: Diagnosis not present

## 2020-01-26 DIAGNOSIS — F419 Anxiety disorder, unspecified: Secondary | ICD-10-CM | POA: Diagnosis not present

## 2020-02-01 DIAGNOSIS — S72001D Fracture of unspecified part of neck of right femur, subsequent encounter for closed fracture with routine healing: Secondary | ICD-10-CM | POA: Diagnosis not present

## 2020-02-01 DIAGNOSIS — F329 Major depressive disorder, single episode, unspecified: Secondary | ICD-10-CM | POA: Diagnosis not present

## 2020-02-01 DIAGNOSIS — F0390 Unspecified dementia without behavioral disturbance: Secondary | ICD-10-CM | POA: Diagnosis not present

## 2020-02-01 DIAGNOSIS — M810 Age-related osteoporosis without current pathological fracture: Secondary | ICD-10-CM | POA: Diagnosis not present

## 2020-02-01 DIAGNOSIS — G4700 Insomnia, unspecified: Secondary | ICD-10-CM | POA: Diagnosis not present

## 2020-02-01 DIAGNOSIS — G43909 Migraine, unspecified, not intractable, without status migrainosus: Secondary | ICD-10-CM | POA: Diagnosis not present

## 2020-02-01 DIAGNOSIS — I10 Essential (primary) hypertension: Secondary | ICD-10-CM | POA: Diagnosis not present

## 2020-02-01 DIAGNOSIS — F419 Anxiety disorder, unspecified: Secondary | ICD-10-CM | POA: Diagnosis not present

## 2020-02-01 DIAGNOSIS — D649 Anemia, unspecified: Secondary | ICD-10-CM | POA: Diagnosis not present

## 2020-02-04 DIAGNOSIS — F329 Major depressive disorder, single episode, unspecified: Secondary | ICD-10-CM | POA: Diagnosis not present

## 2020-02-04 DIAGNOSIS — G4700 Insomnia, unspecified: Secondary | ICD-10-CM | POA: Diagnosis not present

## 2020-02-04 DIAGNOSIS — G43909 Migraine, unspecified, not intractable, without status migrainosus: Secondary | ICD-10-CM | POA: Diagnosis not present

## 2020-02-04 DIAGNOSIS — F0390 Unspecified dementia without behavioral disturbance: Secondary | ICD-10-CM | POA: Diagnosis not present

## 2020-02-04 DIAGNOSIS — D649 Anemia, unspecified: Secondary | ICD-10-CM | POA: Diagnosis not present

## 2020-02-04 DIAGNOSIS — S72001D Fracture of unspecified part of neck of right femur, subsequent encounter for closed fracture with routine healing: Secondary | ICD-10-CM | POA: Diagnosis not present

## 2020-02-04 DIAGNOSIS — I10 Essential (primary) hypertension: Secondary | ICD-10-CM | POA: Diagnosis not present

## 2020-02-04 DIAGNOSIS — F419 Anxiety disorder, unspecified: Secondary | ICD-10-CM | POA: Diagnosis not present

## 2020-02-04 DIAGNOSIS — M810 Age-related osteoporosis without current pathological fracture: Secondary | ICD-10-CM | POA: Diagnosis not present

## 2020-02-07 DIAGNOSIS — G4700 Insomnia, unspecified: Secondary | ICD-10-CM | POA: Diagnosis not present

## 2020-02-07 DIAGNOSIS — D649 Anemia, unspecified: Secondary | ICD-10-CM | POA: Diagnosis not present

## 2020-02-07 DIAGNOSIS — F419 Anxiety disorder, unspecified: Secondary | ICD-10-CM | POA: Diagnosis not present

## 2020-02-07 DIAGNOSIS — M810 Age-related osteoporosis without current pathological fracture: Secondary | ICD-10-CM | POA: Diagnosis not present

## 2020-02-07 DIAGNOSIS — G43909 Migraine, unspecified, not intractable, without status migrainosus: Secondary | ICD-10-CM | POA: Diagnosis not present

## 2020-02-07 DIAGNOSIS — F0390 Unspecified dementia without behavioral disturbance: Secondary | ICD-10-CM | POA: Diagnosis not present

## 2020-02-07 DIAGNOSIS — S72001D Fracture of unspecified part of neck of right femur, subsequent encounter for closed fracture with routine healing: Secondary | ICD-10-CM | POA: Diagnosis not present

## 2020-02-07 DIAGNOSIS — F329 Major depressive disorder, single episode, unspecified: Secondary | ICD-10-CM | POA: Diagnosis not present

## 2020-02-07 DIAGNOSIS — I10 Essential (primary) hypertension: Secondary | ICD-10-CM | POA: Diagnosis not present

## 2020-02-15 DIAGNOSIS — F0390 Unspecified dementia without behavioral disturbance: Secondary | ICD-10-CM | POA: Diagnosis not present

## 2020-02-15 DIAGNOSIS — M810 Age-related osteoporosis without current pathological fracture: Secondary | ICD-10-CM | POA: Diagnosis not present

## 2020-02-15 DIAGNOSIS — G43909 Migraine, unspecified, not intractable, without status migrainosus: Secondary | ICD-10-CM | POA: Diagnosis not present

## 2020-02-15 DIAGNOSIS — F419 Anxiety disorder, unspecified: Secondary | ICD-10-CM | POA: Diagnosis not present

## 2020-02-15 DIAGNOSIS — F329 Major depressive disorder, single episode, unspecified: Secondary | ICD-10-CM | POA: Diagnosis not present

## 2020-02-15 DIAGNOSIS — D649 Anemia, unspecified: Secondary | ICD-10-CM | POA: Diagnosis not present

## 2020-02-15 DIAGNOSIS — S72001D Fracture of unspecified part of neck of right femur, subsequent encounter for closed fracture with routine healing: Secondary | ICD-10-CM | POA: Diagnosis not present

## 2020-02-15 DIAGNOSIS — G4700 Insomnia, unspecified: Secondary | ICD-10-CM | POA: Diagnosis not present

## 2020-02-15 DIAGNOSIS — I10 Essential (primary) hypertension: Secondary | ICD-10-CM | POA: Diagnosis not present

## 2020-02-17 DIAGNOSIS — F0390 Unspecified dementia without behavioral disturbance: Secondary | ICD-10-CM | POA: Diagnosis not present

## 2020-02-17 DIAGNOSIS — F419 Anxiety disorder, unspecified: Secondary | ICD-10-CM | POA: Diagnosis not present

## 2020-02-17 DIAGNOSIS — G4700 Insomnia, unspecified: Secondary | ICD-10-CM | POA: Diagnosis not present

## 2020-02-17 DIAGNOSIS — G43909 Migraine, unspecified, not intractable, without status migrainosus: Secondary | ICD-10-CM | POA: Diagnosis not present

## 2020-02-17 DIAGNOSIS — I10 Essential (primary) hypertension: Secondary | ICD-10-CM | POA: Diagnosis not present

## 2020-02-17 DIAGNOSIS — D649 Anemia, unspecified: Secondary | ICD-10-CM | POA: Diagnosis not present

## 2020-02-17 DIAGNOSIS — M810 Age-related osteoporosis without current pathological fracture: Secondary | ICD-10-CM | POA: Diagnosis not present

## 2020-02-17 DIAGNOSIS — F329 Major depressive disorder, single episode, unspecified: Secondary | ICD-10-CM | POA: Diagnosis not present

## 2020-02-17 DIAGNOSIS — S72001D Fracture of unspecified part of neck of right femur, subsequent encounter for closed fracture with routine healing: Secondary | ICD-10-CM | POA: Diagnosis not present

## 2020-02-20 DIAGNOSIS — G43909 Migraine, unspecified, not intractable, without status migrainosus: Secondary | ICD-10-CM | POA: Diagnosis not present

## 2020-02-20 DIAGNOSIS — G4700 Insomnia, unspecified: Secondary | ICD-10-CM | POA: Diagnosis not present

## 2020-02-20 DIAGNOSIS — M810 Age-related osteoporosis without current pathological fracture: Secondary | ICD-10-CM | POA: Diagnosis not present

## 2020-02-20 DIAGNOSIS — F329 Major depressive disorder, single episode, unspecified: Secondary | ICD-10-CM | POA: Diagnosis not present

## 2020-02-20 DIAGNOSIS — F419 Anxiety disorder, unspecified: Secondary | ICD-10-CM | POA: Diagnosis not present

## 2020-02-20 DIAGNOSIS — S72001D Fracture of unspecified part of neck of right femur, subsequent encounter for closed fracture with routine healing: Secondary | ICD-10-CM | POA: Diagnosis not present

## 2020-02-20 DIAGNOSIS — I10 Essential (primary) hypertension: Secondary | ICD-10-CM | POA: Diagnosis not present

## 2020-02-20 DIAGNOSIS — D649 Anemia, unspecified: Secondary | ICD-10-CM | POA: Diagnosis not present

## 2020-02-20 DIAGNOSIS — F0390 Unspecified dementia without behavioral disturbance: Secondary | ICD-10-CM | POA: Diagnosis not present

## 2020-02-22 DIAGNOSIS — S72341D Displaced spiral fracture of shaft of right femur, subsequent encounter for closed fracture with routine healing: Secondary | ICD-10-CM | POA: Diagnosis not present

## 2020-02-23 DIAGNOSIS — G43909 Migraine, unspecified, not intractable, without status migrainosus: Secondary | ICD-10-CM | POA: Diagnosis not present

## 2020-02-23 DIAGNOSIS — M810 Age-related osteoporosis without current pathological fracture: Secondary | ICD-10-CM | POA: Diagnosis not present

## 2020-02-23 DIAGNOSIS — F0390 Unspecified dementia without behavioral disturbance: Secondary | ICD-10-CM | POA: Diagnosis not present

## 2020-02-23 DIAGNOSIS — F419 Anxiety disorder, unspecified: Secondary | ICD-10-CM | POA: Diagnosis not present

## 2020-02-23 DIAGNOSIS — D649 Anemia, unspecified: Secondary | ICD-10-CM | POA: Diagnosis not present

## 2020-02-23 DIAGNOSIS — G4700 Insomnia, unspecified: Secondary | ICD-10-CM | POA: Diagnosis not present

## 2020-02-23 DIAGNOSIS — S72001D Fracture of unspecified part of neck of right femur, subsequent encounter for closed fracture with routine healing: Secondary | ICD-10-CM | POA: Diagnosis not present

## 2020-02-23 DIAGNOSIS — I10 Essential (primary) hypertension: Secondary | ICD-10-CM | POA: Diagnosis not present

## 2020-02-23 DIAGNOSIS — F329 Major depressive disorder, single episode, unspecified: Secondary | ICD-10-CM | POA: Diagnosis not present

## 2020-03-01 ENCOUNTER — Other Ambulatory Visit: Payer: Self-pay | Admitting: Orthopedic Surgery

## 2020-03-01 DIAGNOSIS — S72341D Displaced spiral fracture of shaft of right femur, subsequent encounter for closed fracture with routine healing: Secondary | ICD-10-CM

## 2020-03-15 ENCOUNTER — Other Ambulatory Visit: Payer: Medicare PPO

## 2020-03-21 DIAGNOSIS — Z1389 Encounter for screening for other disorder: Secondary | ICD-10-CM | POA: Diagnosis not present

## 2020-03-21 DIAGNOSIS — I129 Hypertensive chronic kidney disease with stage 1 through stage 4 chronic kidney disease, or unspecified chronic kidney disease: Secondary | ICD-10-CM | POA: Diagnosis not present

## 2020-03-21 DIAGNOSIS — E7800 Pure hypercholesterolemia, unspecified: Secondary | ICD-10-CM | POA: Diagnosis not present

## 2020-03-21 DIAGNOSIS — E785 Hyperlipidemia, unspecified: Secondary | ICD-10-CM | POA: Diagnosis not present

## 2020-03-21 DIAGNOSIS — M810 Age-related osteoporosis without current pathological fracture: Secondary | ICD-10-CM | POA: Diagnosis not present

## 2020-03-21 DIAGNOSIS — Z23 Encounter for immunization: Secondary | ICD-10-CM | POA: Diagnosis not present

## 2020-03-21 DIAGNOSIS — Z0000 Encounter for general adult medical examination without abnormal findings: Secondary | ICD-10-CM | POA: Diagnosis not present

## 2020-03-21 DIAGNOSIS — F0390 Unspecified dementia without behavioral disturbance: Secondary | ICD-10-CM | POA: Diagnosis not present

## 2020-03-21 DIAGNOSIS — G4700 Insomnia, unspecified: Secondary | ICD-10-CM | POA: Diagnosis not present

## 2020-04-05 DIAGNOSIS — C44319 Basal cell carcinoma of skin of other parts of face: Secondary | ICD-10-CM | POA: Diagnosis not present

## 2020-04-05 DIAGNOSIS — D1801 Hemangioma of skin and subcutaneous tissue: Secondary | ICD-10-CM | POA: Diagnosis not present

## 2020-04-05 DIAGNOSIS — Z85828 Personal history of other malignant neoplasm of skin: Secondary | ICD-10-CM | POA: Diagnosis not present

## 2020-04-05 DIAGNOSIS — L738 Other specified follicular disorders: Secondary | ICD-10-CM | POA: Diagnosis not present

## 2020-04-05 DIAGNOSIS — C44729 Squamous cell carcinoma of skin of left lower limb, including hip: Secondary | ICD-10-CM | POA: Diagnosis not present

## 2020-04-05 DIAGNOSIS — C4401 Basal cell carcinoma of skin of lip: Secondary | ICD-10-CM | POA: Diagnosis not present

## 2020-04-05 DIAGNOSIS — L821 Other seborrheic keratosis: Secondary | ICD-10-CM | POA: Diagnosis not present

## 2020-04-05 DIAGNOSIS — L814 Other melanin hyperpigmentation: Secondary | ICD-10-CM | POA: Diagnosis not present

## 2020-04-06 ENCOUNTER — Telehealth: Payer: Self-pay | Admitting: Internal Medicine

## 2020-04-06 NOTE — Telephone Encounter (Signed)
Patient's daughter [name] May called to schedule the patients Prolia shot.  Phone number for return call is [phone].

## 2020-04-23 ENCOUNTER — Ambulatory Visit
Admission: RE | Admit: 2020-04-23 | Discharge: 2020-04-23 | Disposition: A | Discharge status: Home / Self Care | Payer: Medicare PPO | Source: Ambulatory Visit | Attending: Orthopedic Surgery | Admitting: Orthopedic Surgery

## 2020-04-23 ENCOUNTER — Other Ambulatory Visit: Payer: Self-pay

## 2020-04-23 DIAGNOSIS — S72341D Displaced spiral fracture of shaft of right femur, subsequent encounter for closed fracture with routine healing: Secondary | ICD-10-CM

## 2020-04-23 DIAGNOSIS — S72341A Displaced spiral fracture of shaft of right femur, initial encounter for closed fracture: Secondary | ICD-10-CM | POA: Diagnosis not present

## 2020-04-24 NOTE — Telephone Encounter (Signed)
Need insurance VOB.

## 2020-04-26 NOTE — Telephone Encounter (Addendum)
Dr. Elvera Lennox, last OV with you was 07/09/18. Last Prolia inj 01/19/18. Does pt need OV prior to receiving next Prolia injection? If so, please forward to scheduling to contact pt to schedule OV.   Thanks!

## 2020-04-26 NOTE — Telephone Encounter (Signed)
Hi Brandy, As mentioned for the previous patients, we should NEVER hold off Prolia for these patients.  If possible, can you please direct this messages directly to my scheduler, Noelle Penner to be scheduled within the next 1 to 3 months? -I am attaching her to this message. Ty! C

## 2020-04-30 DIAGNOSIS — M6281 Muscle weakness (generalized): Secondary | ICD-10-CM | POA: Diagnosis not present

## 2020-04-30 NOTE — Telephone Encounter (Signed)
PRIMARY MEDICAL BENEFIT DETAILS (PHYSICIAN PURCHASE, OR REFERRAL TO TREATING SITE) COVERAGE AVAILABLE: Yes COVERAGE DETAILS: Product will be covered at 100% of the contracted rate. No deductible or coinsurance applies. The benefits provided on this Verification of Benefits form are Medical Benefits and are the patient's In-Network benefits for Prolia. If you would like Pharmacy Benefits for Prolia, please call [phone].  AUTHORIZATION REQUIRED: Yes PA PROCESS DETAILS: PA is required. PA can be initiated by calling [phone] or online at [email or web address]. 

## 2020-05-02 NOTE — Telephone Encounter (Signed)
PA initiated via CoverMyMeds.com  Dustin Madan (Key: [id number])  Your information has been sent to [facility].  Your information has been submitted to [facility]. [employer] will review the request and will issue a decision, typically within 3-7 days from your submission. You can check the updated outcome later by reopening this request.  If [employer] has not responded in 3-7 days or if you have any questions about your ePA request, please contact [employer] at [phone]. If you think there may be a problem with your PA request, use our live chat feature at the bottom right.  For [place] requests, please call [phone].

## 2020-05-07 NOTE — Telephone Encounter (Signed)
PA APPROVED  PA# [id number] valid 05/02/20-/02/09/21

## 2020-05-07 NOTE — Telephone Encounter (Signed)
Pt ready for scheduling.   Out-of-pocket cost due to time of visit: $0.00  [employer] Medicare VOB Prolia co-insurance: 0% Admin fee co-insurance: 0%  Deductible: does not apply

## 2020-05-09 DIAGNOSIS — M6281 Muscle weakness (generalized): Secondary | ICD-10-CM | POA: Diagnosis not present

## 2020-05-09 NOTE — Telephone Encounter (Signed)
LMTCB to schedule Prolia 

## 2020-05-10 NOTE — Telephone Encounter (Signed)
Scheduled injection for Tuesday 05/15/20 @ 245 PM

## 2020-05-11 DIAGNOSIS — M6281 Muscle weakness (generalized): Secondary | ICD-10-CM | POA: Diagnosis not present

## 2020-05-15 ENCOUNTER — Ambulatory Visit: Payer: Medicare PPO

## 2020-05-18 DIAGNOSIS — M6281 Muscle weakness (generalized): Secondary | ICD-10-CM | POA: Diagnosis not present

## 2020-05-23 DIAGNOSIS — M6281 Muscle weakness (generalized): Secondary | ICD-10-CM | POA: Diagnosis not present

## 2020-05-25 DIAGNOSIS — M6281 Muscle weakness (generalized): Secondary | ICD-10-CM | POA: Diagnosis not present

## 2020-05-29 DIAGNOSIS — M6281 Muscle weakness (generalized): Secondary | ICD-10-CM | POA: Diagnosis not present

## 2020-05-30 ENCOUNTER — Other Ambulatory Visit: Payer: Self-pay

## 2020-05-30 ENCOUNTER — Ambulatory Visit (INDEPENDENT_AMBULATORY_CARE_PROVIDER_SITE_OTHER): Payer: Medicare PPO

## 2020-05-30 DIAGNOSIS — M810 Age-related osteoporosis without current pathological fracture: Secondary | ICD-10-CM | POA: Diagnosis not present

## 2020-05-30 MED ORDER — DENOSUMAB 60 MG/ML ~~LOC~~ SOSY
60.0000 mg | PREFILLED_SYRINGE | Freq: Once | SUBCUTANEOUS | Status: AC
  Administered 2020-05-30: 60 mg via SUBCUTANEOUS

## 2020-05-30 NOTE — Progress Notes (Signed)
Prolia injection administered in pt's right arm. Pt tolerated well.

## 2020-05-31 DIAGNOSIS — M6281 Muscle weakness (generalized): Secondary | ICD-10-CM | POA: Diagnosis not present

## 2020-06-06 DIAGNOSIS — M6281 Muscle weakness (generalized): Secondary | ICD-10-CM | POA: Diagnosis not present

## 2020-06-28 ENCOUNTER — Encounter: Payer: Self-pay | Admitting: Neurology

## 2020-06-28 ENCOUNTER — Ambulatory Visit: Payer: Medicare PPO | Admitting: Neurology

## 2020-06-28 VITALS — BP 126/65 | HR 66 | Ht 63.0 in | Wt 122.0 lb

## 2020-06-28 DIAGNOSIS — R413 Other amnesia: Secondary | ICD-10-CM

## 2020-06-28 DIAGNOSIS — G43009 Migraine without aura, not intractable, without status migrainosus: Secondary | ICD-10-CM

## 2020-06-28 MED ORDER — DONEPEZIL HCL 10 MG PO TABS: 10.0000 mg | ORAL_TABLET | Freq: Every day | ORAL | 3 refills | Status: DC

## 2020-06-28 MED ORDER — SUMATRIPTAN SUCCINATE 50 MG PO TABS: 50.0000 mg | ORAL_TABLET | ORAL | 3 refills | Status: DC | PRN

## 2020-06-28 MED ORDER — MEMANTINE HCL 10 MG PO TABS: 10.0000 mg | ORAL_TABLET | Freq: Two times a day (BID) | ORAL | 3 refills | Status: DC

## 2020-06-28 MED ORDER — GABAPENTIN 100 MG PO CAPS: ORAL_CAPSULE | ORAL | 3 refills | Status: DC

## 2020-06-28 NOTE — Progress Notes (Signed)
PATIENT: Jeanne Lopez DOB: Jun 12, 1932  REASON FOR VISIT: follow up HISTORY FROM: patient  HISTORY OF PRESENT ILLNESS: Today 06/28/20  Jeanne Lopez is an 87 year old female with history of migraine headache and memory disorder.  On low-dose gabapentin and Imitrex for headache.  On Aricept and Namenda for memory.  Is doing quite well, her headaches have essentially resolved.  About 6 weeks ago, she did have a few days of headache, Imitrex works great.  She rarely has to take Imitrex.  Memory is stable, noted no changes.  Lives with her daughter, Jeanne Lopez.  No major issues.  She does her own ADLs.  Enjoys her little dog.  No recent falls.  Her right femur fracture has completely healed.  Her overall health is good.  Here today for evaluation accompanied by Jeanne Lopez.  MMSE 18/30 today.  HISTORY 12/26/2019 SS: Jeanne Lopez is an 87 year old female history of migraine headache and memory disorder.  She is on low-dose gabapentin, Imitrex for headache.  She is on Aricept 10 mg daily, and Namenda.  She suffered a fall, with right femur fracture in August.  She completed rehab at [facility]  Is now back at home, where her daughter lives with her.  She is walking with a walker, remains in PT.  Daughter has noted some decline in memory, is more forgetful.  She has felt that Namenda has been helpful.  She is sleeping fairly well, has a good appetite.  Continues to enjoy being with her little dog.  Headaches are rare, she did have one today, took Imitrex with good benefit.  Prior, was several months ago.  Presents today for follow-up via virtual visit.   REVIEW OF SYSTEMS: Out of a complete 14 system review of symptoms, the patient complains only of the following symptoms, and all other reviewed systems are negative.  Memory loss  ALLERGIES: Allergies  Allergen Reactions  . Codeine Nausea And Vomiting    HOME MEDICATIONS: Outpatient Medications Prior to Visit  Medication Sig Dispense Refill  .  donepezil (ARICEPT) 10 MG tablet Take 1 tablet (10 mg total) by mouth at bedtime. 90 tablet 3  . gabapentin (NEURONTIN) 100 MG capsule TAKE (1) CAPSULE TWICE DAILY. 180 capsule 3  . memantine (NAMENDA) 10 MG tablet Take 1 tablet (10 mg total) by mouth 2 (two) times daily. 180 tablet 3  . polyethylene glycol (MIRALAX / GLYCOLAX) packet Take 17 g by mouth daily as needed for mild constipation.     . senna (SENOKOT) 8.6 MG TABS tablet Take 1 tablet by mouth.    . SUMAtriptan (IMITREX) 50 MG tablet Take 50 mg by mouth every 2 (two) hours as needed. May repeat in 2 hours if headache persists or recurs.    . traZODone (DESYREL) 100 MG tablet Take 1 tablet (100 mg total) by mouth at bedtime.    Marland Kitchen venlafaxine XR (EFFEXOR-XR) 150 MG 24 hr capsule Take 150 mg by mouth daily.    Marland Kitchen acetaminophen (TYLENOL) 500 MG tablet Take 2 tablets (1,000 mg total) by mouth 3 (three) times daily. 90 tablet 0  . docusate sodium (COLACE) 100 MG capsule Take 1 capsule (100 mg total) by mouth 2 (two) times daily. 10 capsule 0  . enoxaparin (LOVENOX) 40 MG/0.4ML injection Inject 0.4 mLs (40 mg total) into the skin daily for 21 days. 8.4 mL 0  . feeding supplement, ENSURE ENLIVE, (ENSURE ENLIVE) LIQD Take 237 mLs by mouth 3 (three) times daily between meals. 237 mL 12  .  traMADol (ULTRAM) 50 MG tablet Take 1 tablet (50 mg total) by mouth every 8 (eight) hours as needed for moderate pain or severe pain. 10 tablet 0   No facility-administered medications prior to visit.    PAST MEDICAL HISTORY: Past Medical History:  Diagnosis Date  . Anxiety   . Depression   . Heart murmur   . Insomnia   . Memory disorder 04/02/2017  . Meniere's disease   . Migraine   . Osteoporosis     PAST SURGICAL HISTORY: Past Surgical History:  Procedure Laterality Date  . APPENDECTOMY    . BREAST LUMPECTOMY Right   . DILATION AND CURETTAGE OF UTERUS    . FEMUR IM NAIL Right 10/06/2019   Procedure: INTRAMEDULLARY (IM) NAIL FEMORAL;  Surgeon:  Myrene Galas, MD;  Location: [facility] OR;  Service: Orthopedics;  Laterality: Right;    FAMILY HISTORY: Family History  Problem Relation Age of Onset  . Hypertension Brother   . Cancer Sister   . Hypertension Sister   . Hypertension Mother   . Heart attack Mother   . Hypertension Father   . Stroke Father   . Alcohol abuse Daughter   . Depression Daughter   . Drug abuse Daughter   . Alcohol abuse Son   . Depression Son   . Drug abuse Son   . CVA Maternal Grandmother   . Kidney disease Neg Hx   . Hyperlipidemia Neg Hx   . Heart disease Neg Hx   . Early death Neg Hx   . Arthritis Neg Hx     SOCIAL HISTORY: Social History   Socioeconomic History  . Marital status: Widowed    Spouse name: Not on file  . Number of children: 3  . Years of education: College  . Highest education level: Not on file  Occupational History  . Occupation: Retired  Tobacco Use  . Smoking status: Never Smoker  . Smokeless tobacco: Never Used  Vaping Use  . Vaping Use: Never used  Substance and Sexual Activity  . Alcohol use: No  . Drug use: No  . Sexual activity: Not Currently  Other Topics Concern  . Not on file  Social History Narrative   Lives with daughter and dog.    Grandchildren in frequently to help her.    Retired.    Right handed    Caffeine use: Coffee every morning, 1 glass tea daily   Social Determinants of Health   Financial Resource Strain: Not on file  Food Insecurity: Not on file  Transportation Needs: Not on file  Physical Activity: Not on file  Stress: Not on file  Social Connections: Not on file  Intimate Partner Violence: Not on file   PHYSICAL EXAM  Vitals:   06/28/20 1442  BP: 126/65  Pulse: 66  Weight: 122 lb (55.3 kg)  Height: 5\' 3"  (1.6 m)   Body mass index is 21.61 kg/m.  Generalized: Well developed, in no acute distress  MMSE - Mini Mental State Exam 06/28/2020 05/25/2019 10/09/2017  Orientation to time 3 1 3   Orientation to Place 4 4 4    Registration 3 3 3   Attention/ Calculation 0 2 5  Recall 0 2 2  Language- name 2 objects 2 2 2   Language- repeat 1 1 0  Language- follow 3 step command 3 3 3   Language- read & follow direction 1 1 1   Write a sentence 1 1 1   Copy design 0 0 1  Total score 18 20 25  Neurological examination  Mentation: Alert oriented to time, place, history taking. Follows all commands speech and language fluent Cranial nerve II-XII: Pupils were equal round reactive to light. Extraocular movements were full, visual field were full on confrontational test. Facial sensation and strength were normal. Head turning and shoulder shrug  were normal and symmetric. Motor: The motor testing reveals 5 over 5 strength of all 4 extremities. Good symmetric motor tone is noted throughout.  Sensory: Sensory testing is intact to soft touch on all 4 extremities. No evidence of extinction is noted.  Coordination: Cerebellar testing reveals good finger-nose-finger and heel-to-shin bilaterally.  Gait and station: Gait is slightly wide-based, but steady and independent. Reflexes: Deep tendon reflexes are symmetric and normal bilaterally.   DIAGNOSTIC DATA (LABS, IMAGING, TESTING) - I reviewed patient records, labs, notes, testing and imaging myself where available.  Lab Results  Component Value Date   WBC 5.7 10/11/2019   HGB 9.2 (L) 10/11/2019   HCT 29.5 (L) 10/11/2019   MCV 93.4 10/11/2019   PLT 184 10/11/2019      Component Value Date/Time   NA 140 10/11/2019 0449   K 4.1 10/11/2019 0449   CL 107 10/11/2019 0449   CO2 23 10/11/2019 0449   GLUCOSE 96 10/11/2019 0449   BUN 14 10/11/2019 0449   CREATININE 0.84 10/11/2019 0449   CREATININE 0.77 07/08/2017 1125   CALCIUM 8.9 10/11/2019 0449   PROT 4.9 (L) 10/07/2019 0314   ALBUMIN 2.8 (L) 10/07/2019 0314   AST 31 10/07/2019 0314   ALT 13 10/07/2019 0314   ALKPHOS 46 10/07/2019 0314   BILITOT 0.6 10/07/2019 0314   GFRNONAA >60 10/11/2019 0449   GFRNONAA 72  07/08/2017 1125   GFRAA >60 10/11/2019 0449   GFRAA 83 07/08/2017 1125   Lab Results  Component Value Date   CHOL 242 (H) 07/12/2014   HDL 78.80 07/12/2014   LDLCALC 145 (H) 07/12/2014   TRIG 93.0 07/12/2014   CHOLHDL 3 07/12/2014   No results found for: HGBA1C Lab Results  Component Value Date   VITAMINB12 874 08/16/2013   Lab Results  Component Value Date   TSH 3.874 03/20/2018    ASSESSMENT AND PLAN 87 y.o. year old female  has a past medical history of Anxiety, Depression, Heart murmur, Insomnia, Memory disorder (04/02/2017), Meniere's disease, Migraine, and Osteoporosis. here with:  1.  Migraine headaches -rare, doing well  -Continue gabapentin 100 mg twice daily -Continue Imitrex as needed for acute headache  2.  Memory disturbance, MMSE 18/30 -She looks really well today, in good spirits  -Continue Aricept, Namenda -Doing well living with her daughter -Follow-up in 1 year or sooner if needed  Otila Kluver, DNP 06/28/2020, 3:03 PM [facility] [address], [address] [place], [place] [address] [phone]

## 2020-06-28 NOTE — Patient Instructions (Signed)
Continue current medications  See you back in 1 year  

## 2020-06-30 NOTE — Progress Notes (Signed)
I have read the note, and I agree with the clinical assessment and plan.  Donna Snooks K Statia Burdick   

## 2020-09-11 DIAGNOSIS — I10 Essential (primary) hypertension: Secondary | ICD-10-CM | POA: Diagnosis not present

## 2020-09-11 DIAGNOSIS — H5203 Hypermetropia, bilateral: Secondary | ICD-10-CM | POA: Diagnosis not present

## 2020-09-11 DIAGNOSIS — H524 Presbyopia: Secondary | ICD-10-CM | POA: Diagnosis not present

## 2020-09-11 DIAGNOSIS — H2513 Age-related nuclear cataract, bilateral: Secondary | ICD-10-CM | POA: Diagnosis not present

## 2020-09-11 DIAGNOSIS — H52223 Regular astigmatism, bilateral: Secondary | ICD-10-CM | POA: Diagnosis not present

## 2020-09-18 DIAGNOSIS — I071 Rheumatic tricuspid insufficiency: Secondary | ICD-10-CM | POA: Diagnosis not present

## 2020-09-18 DIAGNOSIS — E7800 Pure hypercholesterolemia, unspecified: Secondary | ICD-10-CM | POA: Diagnosis not present

## 2020-09-18 DIAGNOSIS — F5101 Primary insomnia: Secondary | ICD-10-CM | POA: Diagnosis not present

## 2020-09-18 DIAGNOSIS — F419 Anxiety disorder, unspecified: Secondary | ICD-10-CM | POA: Diagnosis not present

## 2020-09-18 DIAGNOSIS — M810 Age-related osteoporosis without current pathological fracture: Secondary | ICD-10-CM | POA: Diagnosis not present

## 2020-09-18 DIAGNOSIS — I10 Essential (primary) hypertension: Secondary | ICD-10-CM | POA: Diagnosis not present

## 2020-09-18 DIAGNOSIS — F0150 Vascular dementia without behavioral disturbance: Secondary | ICD-10-CM | POA: Diagnosis not present

## 2020-10-11 NOTE — Telephone Encounter (Signed)
Prolia VOB initiated via [email or web address]  Last OV:  - NEEDS OV Next OV:  Last Prolia inj: 05/30/20 Next Prolia inj DUE: 11/30/20

## 2020-10-16 NOTE — Telephone Encounter (Signed)
Pt has not been seen in office for visit with Dr. Elvera Lennox since 07/09/2018, pt needs OV.  Pt ready for scheduling on or after 11/30/20  Out-of-pocket cost due at time of visit: $0.00  Primary: [employer] Prolia co-insurance: 0% Admin fee co-insurance: 0%  Secondary: n/a Prolia co-insurance:  Admin fee co-insurance:   Deductible: does not apply  Prior Auth: APPROVED PA# [id number] Valid: 05/02/20-02/09/21    ** This summary of benefits is an estimation of the patient's out-of-pocket cost. Exact cost may very based on individual plan coverage.

## 2021-02-06 NOTE — Telephone Encounter (Signed)
Left message for patient to call back to schedule appt with Provider prior to Prolia Injection.

## 2021-02-21 ENCOUNTER — Other Ambulatory Visit: Payer: Self-pay

## 2021-02-21 ENCOUNTER — Encounter: Payer: Self-pay | Admitting: Internal Medicine

## 2021-02-21 ENCOUNTER — Ambulatory Visit: Payer: Medicare PPO | Admitting: Internal Medicine

## 2021-02-21 VITALS — BP 110/60 | HR 53 | Ht 63.0 in | Wt 122.2 lb

## 2021-02-21 DIAGNOSIS — E559 Vitamin D deficiency, unspecified: Secondary | ICD-10-CM

## 2021-02-21 DIAGNOSIS — M810 Age-related osteoporosis without current pathological fracture: Secondary | ICD-10-CM | POA: Diagnosis not present

## 2021-02-21 LAB — BASIC METABOLIC PANEL
BUN: 19 mg/dL (ref 6–23)
CO2: 33 mEq/L — ABNORMAL HIGH (ref 19–32)
Calcium: 10.1 mg/dL (ref 8.4–10.5)
Chloride: 104 mEq/L (ref 96–112)
Creatinine, Ser: 0.97 mg/dL (ref 0.40–1.20)
GFR: 52.97 mL/min — ABNORMAL LOW (ref >60.00)
Glucose, Bld: 88 mg/dL (ref 70–99)
Potassium: 4.8 mEq/L (ref 3.5–5.1)
Sodium: 143 mEq/L (ref 135–145)

## 2021-02-21 LAB — VITAMIN D 25 HYDROXY (VIT D DEFICIENCY, FRACTURES): VITD: 57.97 ng/mL (ref 30.00–100.00)

## 2021-02-21 NOTE — Patient Instructions (Addendum)
Please stop at the lab.  Continue Vitamin D 1000 units daily.  Resume Prolia - do not delay Prolia by >1 month.  Please ask Dr. Katrinka Blazing to order a bone density scan in her office and send the results to me.  Please return to see me in 1 year.

## 2021-02-21 NOTE — Progress Notes (Signed)
Patient ID: [name], female   DOB: [birth year], 88 y.o.   MRN: [id number]  This visit occurred during the SARS-CoV-2 public health emergency.  Safety protocols were in place, including screening questions prior to the visit, additional usage of staff PPE, and extensive cleaning of exam room while observing appropriate contact time as indicated for disinfecting solutions.   HPI  SHAWONDA KERCE is a 88 y.o.-year-old female, initially referred by Dr. Severiano Gilbert, presenting for follow-up for osteoporosis. Last visit 2 years and 8 months ago (virtual). She is here with her daughter who offers most of the history, especially related to her medical history, symptoms, lab tests, medications.  Interim history: She returns after long absence -last visit 06/2018. She had a right femoral fracture on 10/05/2019 after a fall in the yard (lost footing).  She had a rod implanted >> was in rehab >> now healed well. No disequilibrium/dizziness/orthostasis/vision pbs.  Reviewed and addended history: Pt was dx with OP in her 88s.  She had: a vertebral fracture found on VFA analysis (01/2013): moderate wedge, L2. Reviewing the chart, she also had a right foot fracture (12/2000). Closed displaced spiral fracture of the right femur (10/05/2019).  She also has dizziness/vertigo (Meniere's ds.) -Controlled.  Reviewed patient's latest DXA scan, including the one from 2017, which showed improvement: Date L1-L4 (L2)T score RFN T score LFN T score 33% distal Radius  07/16/2015 [facility], [place])  -2.8(+6.8%*)  -2.4 -2.9 -3.9  01/26/2013 ([facility], [place]) -3.2 -2.5 -3.0 -4.4  08/07/2008 ([facility]) L1-L4: -3.0 -3.0 (however, the femur not properly rotated, ROI not properly defined) -2.8 (however, the femur not properly rotated, ROI not properly defined)  n/a  12/16/2000 L1-L4: -3.7 Total Hip: -3.4    12/05/1998 L1-L4: -3.8 Total Hip: -3.3    05/18/1997 L1-L4: -3.7 Total Hip: -3.3     Reviewed  previous and current osteoporosis treatment:  - estrogen 1976-1986 - on Fosamax 1986-2003 - on Boniva 2004-2015 -On Prolia: 09/15/2013, 05/30/2014, 11/2014, 06/11/2015, 04/17/2016, 11/12/2016, 06/30/2017, 01/19/2018, 05/30/2020  No history of hyperparathyroidism.  No history of hyperparathyroidism.  No history of kidney stones.  She has had some low calcium readings from when she was in the hospital in 03/2018 and 09/2019, most likely due to IV fluids: 09/18/2020: Calcium 9.89 - normal Lab Results  Component Value Date   PTH 25.9 08/11/2013   CALCIUM 8.9 10/11/2019   CALCIUM 8.8 (L) 10/10/2019   CALCIUM 8.8 (L) 10/09/2019   CALCIUM 8.7 (L) 10/08/2019   CALCIUM 8.3 (L) 10/07/2019   CALCIUM 9.3 10/05/2019   CALCIUM 8.5 (L) 03/22/2018   CALCIUM 8.1 (L) 03/21/2018   CALCIUM 8.2 (L) 03/20/2018   CALCIUM 7.5 (L) 03/19/2018   No history of thyrotoxicosis: 03/21/2020: TSH 2.74 Lab Results  Component Value Date   TSH 3.874 03/20/2018   Latest vitamin D level was reviewed and this was slightly low: Lab Results  Component Value Date   VD25OH 24.62 (L) 10/07/2019   VD25OH 48.05 07/08/2017   VD25OH 39.30 07/08/2016   VD25OH 48.51 06/11/2015   VD25OH 34.91 06/09/2014   VD25OH 57.99 08/11/2013   She continues on vitamin D 1000 units daily.  She eats plenty of green leafy vegetables, and not much dairy.  No CKD. Last BUN/Cr: 09/18/2020: 18/1.06, GFR 51, glucose 76 Lab Results  Component Value Date   BUN 14 10/11/2019   CREATININE 0.84 10/11/2019   She is walking with her dog for exercise.  ROS: + back pain + Per  HPI  I reviewed pt's medications, allergies, PMH, social hx, family hx, and changes were documented in the history of present illness. Otherwise, unchanged from my initial visit note.  Past Medical History:  Diagnosis Date   Anxiety    Depression    Heart murmur    Insomnia    Memory disorder 04/02/2017   Meniere's disease    Migraine    Osteoporosis    Past Surgical  History:  Procedure Laterality Date   APPENDECTOMY     BREAST LUMPECTOMY Right    DILATION AND CURETTAGE OF UTERUS     FEMUR IM NAIL Right 10/06/2019   Procedure: INTRAMEDULLARY (IM) NAIL FEMORAL;  Surgeon: Myrene GalasHandy, Michael, MD;  Location: [facility] OR;  Service: Orthopedics;  Laterality: Right;   History   Social History   Marital Status: Widowed    Spouse Name: N/A    Number of Children: 3   Social History Main Topics   Smoking status: Never Smoker    Smokeless tobacco: Not on file   Alcohol Use: No   Drug Use: No   Social History Narrative   Lives with daughter and dog. Grandchildren in frequently to help her.    Retired.    Current Outpatient Medications on File Prior to Visit  Medication Sig Dispense Refill   donepezil (ARICEPT) 10 MG tablet Take 1 tablet (10 mg total) by mouth at bedtime. 90 tablet 3   gabapentin (NEURONTIN) 100 MG capsule TAKE (1) CAPSULE TWICE DAILY. 180 capsule 3   memantine (NAMENDA) 10 MG tablet Take 1 tablet (10 mg total) by mouth 2 (two) times daily. 180 tablet 3   polyethylene glycol (MIRALAX / GLYCOLAX) packet Take 17 g by mouth daily as needed for mild constipation.      senna (SENOKOT) 8.6 MG TABS tablet Take 1 tablet by mouth.     SUMAtriptan (IMITREX) 50 MG tablet Take 1 tablet (50 mg total) by mouth every 2 (two) hours as needed. May repeat in 2 hours if headache persists or recurs. 10 tablet 3   traZODone (DESYREL) 100 MG tablet Take 1 tablet (100 mg total) by mouth at bedtime.     venlafaxine XR (EFFEXOR-XR) 150 MG 24 hr capsule Take 150 mg by mouth daily.     No current facility-administered medications on file prior to visit.   Allergies  Allergen Reactions   Codeine Nausea And Vomiting   FH: see HPI + - HTN: in sister, mother - CVA: in MGM  PE: BP 110/60 (BP Location: Right Arm, Patient Position: Sitting, Cuff Size: Normal)    Pulse (!) 53    Ht 5\' 3"  (1.6 m)    Wt 122 lb 3.2 oz (55.4 kg)    SpO2 96%    BMI 21.65 kg/m   Wt Readings from  Last 3 Encounters:  02/21/21 122 lb 3.2 oz (55.4 kg)  06/28/20 122 lb (55.3 kg)  10/05/19 110 lb (49.9 kg)   Constitutional: normal weight, in NAD Eyes: PERRLA, EOMI, no exophthalmos ENT: moist mucous membranes, no thyromegaly, no cervical lymphadenopathy Cardiovascular: RRR, No MRG Respiratory: CTA B Musculoskeletal: no deformities, strength intact in all 4 Skin: moist, warm, no rashes Neurological: no tremor with outstretched hands, DTR normal in all 4  Assessment: 1. Osteoporosis - 1 incidental vb fx: moderate wedge - L2 >> healed - s/p 20 years of Bisphosphonates - no SEs - on Boniva previously, now on Prolia   2.  Vitamin D insufficiency  Plan: 1. Osteoporosis -Only age-related/postmenopausal -  Prolia was started 09/2013 -She tolerated Prolia well, without side effects.  No hip/thigh pain, jaw pain.  She had dental work in the past without problems. -I last saw the patient 2 years and 8 months ago.  At that time, she already had 8 Prolia injections. -However, after our last visit, she was lost for follow-up and only restarted Prolia in 05/2020.  Her previous Prolia injection was on 01/19/2018. She ended up having a right femoral fracture in 09/2019. -At last visit, and again at this visit, we discussed about the importance of not waiting more than 6 to 7 months between Prolia injections, since longer intervals of time will increase her risk of fracture and decrease her bone density.  Due to the >2 years interval between the last 2 Prolia injections, it is possible that her bone density decreased significantly before her fracture -She continues on vitamin D 1000 units daily -I suggested osteo-strong skeletal loading in the past but she did not do this -For now, we discussed about possibly switching to Reclast but she opted to continue Prolia -We will check the following labs today: BMP, vitamin D -We can continue Prolia for at least 10 years, but possibly more -We will need  another bone density scan.  I advised her about have been addressed at last visit but she did not have it yet.  Ideally, this is checked at [facility] office, on the same machine.  I advised her to get in touch with PCP so she can order it. -I we will be in touch with her about the results of the bone density scan return -I will see her back in 1 year  2.  Vitamin D insufficiency -Vitamin D levels were normal in the past but at last check in 09/2019, this was slightly low, at 24.6 -At that time, her daughter mentions that she was not taking the vitamin D consistently -As of now, she is on 1000 units vitamin D every day -We will repeat the level today and may need to increase her supplement dose to 2000 units daily Component     Latest Ref Rng & Units 02/21/2021  Sodium     135 - 145 mEq/L 143  Potassium     3.5 - 5.1 mEq/L 4.8  Chloride     96 - 112 mEq/L 104  CO2     19 - 32 mEq/L 33 (H)  Glucose     70 - 99 mg/dL 88  BUN     6 - 23 mg/dL 19  Creatinine     2.44 - 1.20 mg/dL 0.10  GFR     >27.25 mL/min 52.97 (L)  Calcium     8.4 - 10.5 mg/dL 36.6  VITD     44.03 - 100.00 ng/mL 57.97  Vitamin D is at goal.  She can continue the same dose of vitamin D supplement.  GFR is only slightly low.  We can go ahead with Prolia as discussed.  Carlus Pavlov, MD PhD [facility]

## 2021-02-21 NOTE — Telephone Encounter (Signed)
2023 VOB initiated for Prolia °

## 2021-02-22 ENCOUNTER — Ambulatory Visit: Payer: Medicare PPO | Admitting: Internal Medicine

## 2021-03-05 NOTE — Telephone Encounter (Signed)
Prior auth required for PROLIA ? ?PA PROCESS DETAILS: PA is required. PA can be initiated by calling [phone] or online at ?[email or web address] ? ?

## 2021-03-06 NOTE — Telephone Encounter (Signed)
Prior auth initiated via [email or web address] KEY: [id number]

## 2021-03-09 NOTE — Telephone Encounter (Signed)
Pt ready for scheduling on or after 03/06/21  Out-of-pocket cost due at time of visit: $0  Primary: [employer] Prolia co-insurance: 0% Admin fee co-insurance: 0%  Secondary: n/a Prolia co-insurance:  Admin fee co-insurance:   Deductible: does not apply  Prior Auth: APPROVED PA# [id number] Valid: 03/06/21-02/09/22    ** This summary of benefits is an estimation of the patient's out-of-pocket cost. Exact cost may very based on individual plan coverage.

## 2021-03-11 ENCOUNTER — Other Ambulatory Visit: Payer: Self-pay | Admitting: Family Medicine

## 2021-03-11 DIAGNOSIS — E2839 Other primary ovarian failure: Secondary | ICD-10-CM

## 2021-03-12 NOTE — Telephone Encounter (Signed)
LVM to schedule Prolia injection  °

## 2021-03-26 DIAGNOSIS — I071 Rheumatic tricuspid insufficiency: Secondary | ICD-10-CM | POA: Diagnosis not present

## 2021-03-26 DIAGNOSIS — M810 Age-related osteoporosis without current pathological fracture: Secondary | ICD-10-CM | POA: Diagnosis not present

## 2021-03-26 DIAGNOSIS — Z1389 Encounter for screening for other disorder: Secondary | ICD-10-CM | POA: Diagnosis not present

## 2021-03-26 DIAGNOSIS — G43009 Migraine without aura, not intractable, without status migrainosus: Secondary | ICD-10-CM | POA: Diagnosis not present

## 2021-03-26 DIAGNOSIS — I10 Essential (primary) hypertension: Secondary | ICD-10-CM | POA: Diagnosis not present

## 2021-03-26 DIAGNOSIS — Z0000 Encounter for general adult medical examination without abnormal findings: Secondary | ICD-10-CM | POA: Diagnosis not present

## 2021-03-26 DIAGNOSIS — F0390 Unspecified dementia without behavioral disturbance: Secondary | ICD-10-CM | POA: Diagnosis not present

## 2021-03-26 DIAGNOSIS — G4700 Insomnia, unspecified: Secondary | ICD-10-CM | POA: Diagnosis not present

## 2021-03-26 DIAGNOSIS — E7800 Pure hypercholesterolemia, unspecified: Secondary | ICD-10-CM | POA: Diagnosis not present

## 2021-03-29 ENCOUNTER — Other Ambulatory Visit: Payer: Self-pay

## 2021-03-29 ENCOUNTER — Ambulatory Visit (INDEPENDENT_AMBULATORY_CARE_PROVIDER_SITE_OTHER): Payer: Medicare PPO

## 2021-03-29 DIAGNOSIS — M810 Age-related osteoporosis without current pathological fracture: Secondary | ICD-10-CM

## 2021-03-29 MED ORDER — DENOSUMAB 60 MG/ML ~~LOC~~ SOSY
60.0000 mg | PREFILLED_SYRINGE | Freq: Once | SUBCUTANEOUS | Status: AC
  Administered 2021-03-29: 60 mg via SUBCUTANEOUS

## 2021-03-29 NOTE — Progress Notes (Signed)
Pt verbalized consent to administer Prolia injection. Confirmed pt's name, DOB, allergies and medication. Prolia injection administered to pt's right arm. Pt tolerated well.

## 2021-04-03 NOTE — Telephone Encounter (Signed)
Last Prolia inj 03/29/21 °Next Prolia inj due 09/27/21 °

## 2021-05-15 ENCOUNTER — Ambulatory Visit
Admission: RE | Admit: 2021-05-15 | Discharge: 2021-05-15 | Disposition: A | Discharge status: Home / Self Care | Payer: Medicare PPO | Source: Ambulatory Visit | Attending: Family Medicine | Admitting: Family Medicine

## 2021-05-15 DIAGNOSIS — M810 Age-related osteoporosis without current pathological fracture: Secondary | ICD-10-CM | POA: Diagnosis not present

## 2021-05-15 DIAGNOSIS — M8588 Other specified disorders of bone density and structure, other site: Secondary | ICD-10-CM | POA: Diagnosis not present

## 2021-05-15 DIAGNOSIS — E2839 Other primary ovarian failure: Secondary | ICD-10-CM

## 2021-05-15 DIAGNOSIS — Z780 Asymptomatic menopausal state: Secondary | ICD-10-CM | POA: Diagnosis not present

## 2021-07-02 NOTE — Progress Notes (Unsigned)
PATIENT: Jeanne Lopez DOB: 06/27/1932  REASON FOR VISIT: follow up HISTORY FROM: patient  HISTORY OF PRESENT ILLNESS: Today 07/02/21  Jeanne Lopez   Update 06/28/2020 SS: Jeanne Lopez is an 88 year old female with history of migraine headache and memory disorder.  On low-dose gabapentin and Imitrex for headache.  On Aricept and Namenda for memory.  Is doing quite well, her headaches have essentially resolved.  About 6 weeks ago, she did have a few days of headache, Imitrex works great.  She rarely has to take Imitrex.  Memory is stable, noted no changes.  Lives with her daughter, [name].  No major issues.  She does her own ADLs.  Enjoys her little dog.  No recent falls.  Her right femur fracture has completely healed.  Her overall health is good.  Here today for evaluation accompanied by [name].  MMSE 18/30 today.  HISTORY 12/26/2019 SS: Ms. Jeanne Lopez is an 88 year old female history of migraine headache and memory disorder.  She is on low-dose gabapentin, Imitrex for headache.  She is on Aricept 10 mg daily, and Namenda.  She suffered a fall, with right femur fracture in August.  [facility].  Is now back at home, where her daughter lives with her.  She is walking with a walker, remains in PT.  Daughter has noted some decline in memory, is more forgetful.  She has felt that Namenda has been helpful.  She is sleeping fairly well, has a good appetite.  Continues to enjoy being with her little dog.  Headaches are rare, she did have one today, took Imitrex with good benefit.  Prior, was several months ago.  Presents today for follow-up via virtual visit.   REVIEW OF SYSTEMS: Out of a complete 14 system review of symptoms, the patient complains only of the following symptoms, and all other reviewed systems are negative.  Memory loss  ALLERGIES: Allergies  Allergen Reactions   Codeine Nausea And Vomiting    HOME MEDICATIONS: Outpatient Medications Prior to Visit   Medication Sig Dispense Refill   donepezil (ARICEPT) 10 MG tablet Take 1 tablet (10 mg total) by mouth at bedtime. 90 tablet 3   gabapentin (NEURONTIN) 100 MG capsule TAKE (1) CAPSULE TWICE DAILY. 180 capsule 3   memantine (NAMENDA) 10 MG tablet Take 1 tablet (10 mg total) by mouth 2 (two) times daily. 180 tablet 3   polyethylene glycol (MIRALAX / GLYCOLAX) packet Take 17 g by mouth daily as needed for mild constipation.      senna (SENOKOT) 8.6 MG TABS tablet Take 1 tablet by mouth.     SUMAtriptan (IMITREX) 50 MG tablet Take 1 tablet (50 mg total) by mouth every 2 (two) hours as needed. May repeat in 2 hours if headache persists or recurs. 10 tablet 3   traZODone (DESYREL) 100 MG tablet Take 1 tablet (100 mg total) by mouth at bedtime.     venlafaxine XR (EFFEXOR-XR) 150 MG 24 hr capsule Take 150 mg by mouth daily.     No facility-administered medications prior to visit.    PAST MEDICAL HISTORY: Past Medical History:  Diagnosis Date   Anxiety    Depression    Heart murmur    Insomnia    Memory disorder 04/02/2017   Meniere's disease    Migraine    Osteoporosis     PAST SURGICAL HISTORY: Past Surgical History:  Procedure Laterality Date   APPENDECTOMY     BREAST LUMPECTOMY Right  DILATION AND CURETTAGE OF UTERUS     FEMUR IM NAIL Right 10/06/2019   Procedure: INTRAMEDULLARY (IM) NAIL FEMORAL;  Surgeon: Myrene Galas, MD;  Location: [facility] OR;  Service: Orthopedics;  Laterality: Right;    FAMILY HISTORY: Family History  Problem Relation Age of Onset   Hypertension Brother    Cancer Sister    Hypertension Sister    Hypertension Mother    Heart attack Mother    Hypertension Father    Stroke Father    Alcohol abuse Daughter    Depression Daughter    Drug abuse Daughter    Alcohol abuse Son    Depression Son    Drug abuse Son    CVA Maternal Grandmother    Kidney disease Neg Hx    Hyperlipidemia Neg Hx    Heart disease Neg Hx    Early death Neg Hx    Arthritis Neg  Hx     SOCIAL HISTORY: Social History   Socioeconomic History   Marital status: Widowed    Spouse name: Not on file   Number of children: 3   Years of education: College   Highest education level: Not on file  Occupational History   Occupation: Retired  Tobacco Use   Smoking status: Never   Smokeless tobacco: Never  Vaping Use   Vaping Use: Never used  Substance and Sexual Activity   Alcohol use: No   Drug use: No   Sexual activity: Not Currently  Other Topics Concern   Not on file  Social History Narrative   Lives with daughter and dog.    Grandchildren in frequently to help her.    Retired.    Right handed    Caffeine use: Coffee every morning, 1 glass tea daily   Social Determinants of Health   Financial Resource Strain: Not on file  Food Insecurity: Not on file  Transportation Needs: Not on file  Physical Activity: Not on file  Stress: Not on file  Social Connections: Not on file  Intimate Partner Violence: Not on file   PHYSICAL EXAM  There were no vitals filed for this visit.  There is no height or weight on file to calculate BMI.  Generalized: Well developed, in no acute distress     06/28/2020    2:51 PM 05/25/2019    2:23 PM 10/09/2017   11:37 AM  MMSE - Mini Mental State Exam  Orientation to time 3 1 3   Orientation to Place 4 4 4   Registration 3 3 3   Attention/ Calculation 0 2 5  Recall 0 2 2  Language- name 2 objects 2 2 2   Language- repeat 1 1 0  Language- follow 3 step command 3 3 3   Language- read & follow direction 1 1 1   Write a sentence 1 1 1   Copy design 0 0 1  Total score 18 20 25     Neurological examination  Mentation: Alert oriented to time, place, history taking. Follows all commands speech and language fluent Cranial nerve II-XII: Pupils were equal round reactive to light. Extraocular movements were full, visual field were full on confrontational test. Facial sensation and strength were normal. Head turning and shoulder shrug   were normal and symmetric. Motor: The motor testing reveals 5 over 5 strength of all 4 extremities. Good symmetric motor tone is noted throughout.  Sensory: Sensory testing is intact to soft touch on all 4 extremities. No evidence of extinction is noted.  Coordination: Cerebellar testing reveals good finger-nose-finger  and heel-to-shin bilaterally.  Gait and station: Gait is slightly wide-based, but steady and independent. Reflexes: Deep tendon reflexes are symmetric and normal bilaterally.   DIAGNOSTIC DATA (LABS, IMAGING, TESTING) - I reviewed patient records, labs, notes, testing and imaging myself where available.  Lab Results  Component Value Date   WBC 5.7 10/11/2019   HGB 9.2 (L) 10/11/2019   HCT 29.5 (L) 10/11/2019   MCV 93.4 10/11/2019   PLT 184 10/11/2019      Component Value Date/Time   NA 143 02/21/2021 1508   K 4.8 02/21/2021 1508   CL 104 02/21/2021 1508   CO2 33 (H) 02/21/2021 1508   GLUCOSE 88 02/21/2021 1508   BUN 19 02/21/2021 1508   CREATININE 0.97 02/21/2021 1508   CREATININE 0.77 07/08/2017 1125   CALCIUM 10.1 02/21/2021 1508   PROT 4.9 (L) 10/07/2019 0314   ALBUMIN 2.8 (L) 10/07/2019 0314   AST 31 10/07/2019 0314   ALT 13 10/07/2019 0314   ALKPHOS 46 10/07/2019 0314   BILITOT 0.6 10/07/2019 0314   GFRNONAA >60 10/11/2019 0449   GFRNONAA 72 07/08/2017 1125   GFRAA >60 10/11/2019 0449   GFRAA 83 07/08/2017 1125   Lab Results  Component Value Date   CHOL 242 (H) 07/12/2014   HDL 78.80 07/12/2014   LDLCALC 145 (H) 07/12/2014   TRIG 93.0 07/12/2014   CHOLHDL 3 07/12/2014   No results found for: HGBA1C Lab Results  Component Value Date   VITAMINB12 874 08/16/2013   Lab Results  Component Value Date   TSH 3.874 03/20/2018    ASSESSMENT AND PLAN 88 y.o. year old female   1.  Migraine headaches -rare, doing well  -Continue gabapentin 100 mg twice daily -Continue Imitrex as needed for acute headache  2.  Memory disturbance, MMSE  18/30 -She looks really well today, in good spirits  -Continue Aricept, Namenda -Doing well living with her daughter -Follow-up in 1 year or sooner if needed  Otila Kluver, DNP 07/02/2021, 3:56 PM [facility] [address] [place], [place] [address] [phone]

## 2021-07-03 ENCOUNTER — Ambulatory Visit: Payer: Medicare PPO | Admitting: Neurology

## 2021-07-03 ENCOUNTER — Encounter: Payer: Self-pay | Admitting: Neurology

## 2021-07-03 VITALS — BP 111/44 | HR 48 | Ht 63.0 in | Wt 119.5 lb

## 2021-07-03 DIAGNOSIS — I10 Essential (primary) hypertension: Secondary | ICD-10-CM | POA: Insufficient documentation

## 2021-07-03 DIAGNOSIS — I129 Hypertensive chronic kidney disease with stage 1 through stage 4 chronic kidney disease, or unspecified chronic kidney disease: Secondary | ICD-10-CM | POA: Insufficient documentation

## 2021-07-03 DIAGNOSIS — M129 Arthropathy, unspecified: Secondary | ICD-10-CM | POA: Insufficient documentation

## 2021-07-03 DIAGNOSIS — Z800 Family history of malignant neoplasm of digestive organs: Secondary | ICD-10-CM | POA: Insufficient documentation

## 2021-07-03 DIAGNOSIS — F419 Anxiety disorder, unspecified: Secondary | ICD-10-CM | POA: Insufficient documentation

## 2021-07-03 DIAGNOSIS — H8109 Meniere's disease, unspecified ear: Secondary | ICD-10-CM | POA: Insufficient documentation

## 2021-07-03 DIAGNOSIS — F0150 Vascular dementia without behavioral disturbance: Secondary | ICD-10-CM

## 2021-07-03 DIAGNOSIS — I079 Rheumatic tricuspid valve disease, unspecified: Secondary | ICD-10-CM | POA: Insufficient documentation

## 2021-07-03 DIAGNOSIS — R2689 Other abnormalities of gait and mobility: Secondary | ICD-10-CM | POA: Insufficient documentation

## 2021-07-03 DIAGNOSIS — G43709 Chronic migraine without aura, not intractable, without status migrainosus: Secondary | ICD-10-CM

## 2021-07-03 DIAGNOSIS — G43909 Migraine, unspecified, not intractable, without status migrainosus: Secondary | ICD-10-CM | POA: Insufficient documentation

## 2021-07-03 DIAGNOSIS — Z8249 Family history of ischemic heart disease and other diseases of the circulatory system: Secondary | ICD-10-CM | POA: Insufficient documentation

## 2021-07-03 DIAGNOSIS — K219 Gastro-esophageal reflux disease without esophagitis: Secondary | ICD-10-CM | POA: Insufficient documentation

## 2021-07-03 DIAGNOSIS — Z13220 Encounter for screening for lipoid disorders: Secondary | ICD-10-CM | POA: Insufficient documentation

## 2021-07-03 DIAGNOSIS — E785 Hyperlipidemia, unspecified: Secondary | ICD-10-CM | POA: Insufficient documentation

## 2021-07-03 DIAGNOSIS — M810 Age-related osteoporosis without current pathological fracture: Secondary | ICD-10-CM | POA: Insufficient documentation

## 2021-07-03 DIAGNOSIS — F5101 Primary insomnia: Secondary | ICD-10-CM | POA: Insufficient documentation

## 2021-07-03 DIAGNOSIS — E7800 Pure hypercholesterolemia, unspecified: Secondary | ICD-10-CM | POA: Insufficient documentation

## 2021-07-03 DIAGNOSIS — N3941 Urge incontinence: Secondary | ICD-10-CM | POA: Insufficient documentation

## 2021-07-03 DIAGNOSIS — E2839 Other primary ovarian failure: Secondary | ICD-10-CM | POA: Insufficient documentation

## 2021-07-03 MED ORDER — MEMANTINE HCL 10 MG PO TABS: 10.0000 mg | ORAL_TABLET | Freq: Two times a day (BID) | ORAL | 3 refills | Status: DC

## 2021-07-03 MED ORDER — DONEPEZIL HCL 10 MG PO TABS: 10.0000 mg | ORAL_TABLET | Freq: Every day | ORAL | 3 refills | Status: DC

## 2021-07-03 MED ORDER — GABAPENTIN 100 MG PO CAPS
ORAL_CAPSULE | ORAL | 3 refills | Status: DC
Start: 2021-07-03 — End: 2022-07-08

## 2021-07-03 NOTE — Patient Instructions (Signed)
Monitor heart rate, keep > 55, if continues to be low may consider reduction of Aricept for now continue current medications, see you back in 1 year

## 2021-07-11 DIAGNOSIS — R001 Bradycardia, unspecified: Secondary | ICD-10-CM | POA: Diagnosis not present

## 2021-08-10 NOTE — Telephone Encounter (Signed)
Prolia VOB initiated via [email or web address]  Last Prolia inj 03/29/21 Next Prolia inj due 09/27/21  Prior Auth: APPROVED PA# [id number] Valid: 03/06/21-02/09/22

## 2021-08-21 ENCOUNTER — Telehealth: Payer: Self-pay

## 2021-08-21 NOTE — Telephone Encounter (Signed)
NOTES SCANNED TO REFERRAL 

## 2021-08-26 ENCOUNTER — Telehealth: Payer: Self-pay | Admitting: Neurology

## 2021-08-26 ENCOUNTER — Other Ambulatory Visit: Payer: Self-pay | Admitting: Neurology

## 2021-08-26 NOTE — Telephone Encounter (Signed)
Pt daughter is calling and requesting that Maralyn Sago go back up on the donepezil (ARICEPT) 10 MG tablet. Pt daughter said Maralyn Sago went down on medication because Pt heart weight low but its still low and she feel it had nothing to do with medication. Pt daughter said mother is having bad headaches now and is going to the  cardiologist on August 3rd. She is requesting a return call

## 2021-08-27 NOTE — Telephone Encounter (Signed)
I spoke to the patient's dgt. Feels headaches are not migraines. She told me she doesn't really drink much water throughout the day. She is going to make sure she is staying well hydrated. She will keep [place] posted.   I spoke to Maralyn Sago about the donepezil dosage. She does not feel her low heart rate is related. However, she her cardiology evaluation is in a couple of weeks, we will stay on 5mg  until she has been seen. Her daughter agrees with this plan.

## 2021-09-02 NOTE — Telephone Encounter (Signed)
Pt ready for scheduling on or after 09/27/21   Out-of-pocket cost due at time of visit: $0   Primary: [employer] Prolia co-insurance: 0% Admin fee co-insurance: 0%   Secondary: n/a Prolia co-insurance:  Admin fee co-insurance:    Deductible: does not apply   Prior Auth: APPROVED PA# [id number] Valid: 03/06/21-02/09/22     ** This summary of benefits is an estimation of the patient's out-of-pocket cost. Exact cost may very based on individual plan coverage.

## 2021-09-12 ENCOUNTER — Encounter: Payer: Self-pay | Admitting: Cardiovascular Disease

## 2021-09-12 ENCOUNTER — Ambulatory Visit: Payer: Medicare PPO | Admitting: Cardiovascular Disease

## 2021-09-12 VITALS — BP 128/56 | HR 51 | Ht 63.0 in | Wt 122.0 lb

## 2021-09-12 DIAGNOSIS — I10 Essential (primary) hypertension: Secondary | ICD-10-CM

## 2021-09-12 DIAGNOSIS — I444 Left anterior fascicular block: Secondary | ICD-10-CM

## 2021-09-12 DIAGNOSIS — R001 Bradycardia, unspecified: Secondary | ICD-10-CM | POA: Diagnosis not present

## 2021-09-12 DIAGNOSIS — I350 Nonrheumatic aortic (valve) stenosis: Secondary | ICD-10-CM | POA: Diagnosis not present

## 2021-09-12 NOTE — Patient Instructions (Signed)
Medication Instructions:  STOP Amlodipine   *If you need a refill on your cardiac medications before your next appointment, please call your pharmacy*   Testing/Procedures: Echocardiogram - Your physician has requested that you have an echocardiogram. Echocardiography is a painless test that uses sound waves to create images of your heart. It provides your doctor with information about the size and shape of your heart and how well your heart's chambers and valves are working. This procedure takes approximately one hour. There are no restrictions for this procedure.     Follow-Up: At [facility], you and your health needs are our priority.  As part of our continuing mission to provide you with exceptional heart care, we have created designated Provider Care Teams.  These Care Teams include your primary Cardiologist (physician) and Advanced Practice Providers (APPs -  Physician Assistants and Nurse Practitioners) who all work together to provide you with the care you need, when you need it.  We recommend signing up for the patient portal called "MyChart".  Sign up information is provided on this After Visit Summary.  MyChart is used to connect with patients for Virtual Visits (Telemedicine).  Patients are able to view lab/test results, encounter notes, upcoming appointments, etc.  Non-urgent messages can be sent to your provider as well.   To learn more about what you can do with MyChart, go to [email or web address].    Your next appointment:   12 month(s)  The format for your next appointment:   In Person  Provider:   Lennie Odor, MD

## 2021-09-12 NOTE — Progress Notes (Signed)
Cardiology Office Note:   Date:  09/12/2021  NAME:  Jeanne Lopez    MRN: [id number] DOB:  Sep 25, 1932   PCP:  Carol Ada, MD  Cardiologist:  None  Electrophysiologist:  None   Referring MD: Carol Ada, MD   Chief Complaint  Patient presents with   New Patient (Initial Visit)   Bradycardia   History of Present Illness:   Jeanne Lopez is a 88 y.o. female with a hx of HTN, dementia who is being seen today for the evaluation of bradycardia at the request of Carol Ada, MD. pulse has been noted to be slow.  I have cut back on amlodipine as well as Aricept.  Heart rate 51 today.  She is not having any dizziness or shortness of breath.  No chest pain.  She does have a prominent murmur on exam.  Echo in 2016 showed mild aortic stenosis.  This needs follow-up.  Her EKG demonstrates sinus bradycardia heart rate 51 with a left anterior fascicular block.  She reports no chest pain.  Her daughter lives with her.  They go on walks.  They can walk several blocks without limitations.  Her heart rate seems to be incidentally low.  Recent TSH in February of this year 2.34.  She has never had a heart attack or stroke.  She is on amlodipine but has never really had high blood pressure per the daughter.  Her blood pressure has been quite low.  We discussed stopping amlodipine which I think is reasonable.  She informs me she had a hip fracture and high blood pressure during that admission and has been on blood pressure medicine since that time.  We discussed stopping this today.  She is okay to do that.  She is a never smoker.  She does not drink alcohol or use drugs.  She does have dementia which is followed by neurology.  She is on Aricept and Namenda.  Both are known to cause bradycardia.  She is without complaints.  Murmur is most consistent with progression of aortic stenosis.  Problem List Dementia HTN Aortic stenosis -mild 2016  Past Medical History: Past Medical History:  Diagnosis Date    Anxiety    Depression    Heart murmur    Hypertension    Insomnia    Memory disorder 04/02/2017   Meniere's disease    Migraine    Osteoporosis     Past Surgical History: Past Surgical History:  Procedure Laterality Date   APPENDECTOMY     BREAST LUMPECTOMY Right    DILATION AND CURETTAGE OF UTERUS     FEMUR IM NAIL Right 10/06/2019   Procedure: INTRAMEDULLARY (IM) NAIL FEMORAL;  Surgeon: Altamese Odessa, MD;  Location: [facility];  Service: Orthopedics;  Laterality: Right;    Current Medications: Current Meds  Medication Sig   donepezil (ARICEPT) 10 MG tablet Take 1 tablet (10 mg total) by mouth at bedtime. (Patient taking differently: Take 5 mg by mouth at bedtime.)   gabapentin (NEURONTIN) 100 MG capsule TAKE (1) CAPSULE TWICE DAILY.   memantine (NAMENDA) 10 MG tablet Take 1 tablet (10 mg total) by mouth 2 (two) times daily.   polyethylene glycol (MIRALAX / GLYCOLAX) packet Take 17 g by mouth daily as needed for mild constipation.    senna (SENOKOT) 8.6 MG TABS tablet Take 1 tablet by mouth.   SUMAtriptan (IMITREX) 50 MG tablet Take 1 tablet (50 mg total) by mouth every 2 (two) hours as needed. May repeat in  2 hours if headache persists or recurs.   traZODone (DESYREL) 100 MG tablet Take 1 tablet (100 mg total) by mouth at bedtime.   venlafaxine XR (EFFEXOR-XR) 150 MG 24 hr capsule Take 150 mg by mouth daily.   [DISCONTINUED] amLODipine (NORVASC) 2.5 MG tablet Take 2.5 mg by mouth daily.   [DISCONTINUED] amLODipine (NORVASC) 5 MG tablet Take 5 mg by mouth daily.     Allergies:    Codeine   Social History: Social History   Socioeconomic History   Marital status: Widowed    Spouse name: Not on file   Number of children: 3   Years of education: College   Highest education level: Not on file  Occupational History   Occupation: Retired  Tobacco Use   Smoking status: Never   Smokeless tobacco: Never  Vaping Use   Vaping Use: Never used  Substance and Sexual Activity    Alcohol use: No   Drug use: No   Sexual activity: Not Currently  Other Topics Concern   Not on file  Social History Narrative   Lives with daughter and dog.    Grandchildren in frequently to help her.    Retired.    Right handed    Caffeine use: Coffee every morning, 1 glass tea daily   Social Determinants of Health   Financial Resource Strain: Low Risk  (03/18/2018)   Overall Financial Resource Strain (CARDIA)    Difficulty of Paying Living Expenses: Not hard at all  Food Insecurity: Unknown (03/18/2018)   Hunger Vital Sign    Worried About Running Out of Food in the Last Year: Patient refused    Ran Out of Food in the Last Year: Patient refused  Transportation Needs: Unknown (03/18/2018)   PRAPARE - Transportation    Lack of Transportation (Medical): Patient refused    Lack of Transportation (Non-Medical): Patient refused  Physical Activity: Sufficiently Active (03/18/2018)   Exercise Vital Sign    Days of Exercise per Week: 7 days    Minutes of Exercise per Session: 30 min  Stress: No Stress Concern Present (03/18/2018)   [employer] of Occupational Health - Occupational Stress Questionnaire    Feeling of Stress : Not at all  Social Connections: Somewhat Isolated (03/18/2018)   Social Connection and Isolation Panel [NHANES]    Frequency of Communication with Friends and Family: More than three times a week    Frequency of Social Gatherings with Friends and Family: Twice a week    Attends Religious Services: More than 4 times per year    Active Member of [employer] or Organizations: No    Attends Banker Meetings: Never    Marital Status: Widowed     Family History: The patient's family history includes Alcohol abuse in her daughter and son; CVA in her maternal grandmother; Cancer in her sister; Depression in her daughter and son; Drug abuse in her daughter and son; Heart attack in her mother; Hypertension in her brother, father, mother, and sister; Stroke in her  father. There is no history of Kidney disease, Hyperlipidemia, Heart disease, Early death, or Arthritis.  ROS:   All other ROS reviewed and negative. Pertinent positives noted in the HPI.     EKGs/Labs/Other Studies Reviewed:   The following studies were personally reviewed by me today:  EKG:  EKG is ordered today.  The ekg ordered today demonstrates sinus bradycardia heart rate 51, left anterior fascicular block, and was personally reviewed by me.   TTE 04/17/2014 -  Left ventricle: There was severe concentric LV wall thickening -    possibly consistent with global variant hypertrophic    cardiomyopathy. The estimated ejection fraction was 80%. There    was dynamic obstruction during Valsalvaat an indeterminate    location, with a peak velocity of 269 cm/sec and a peak gradient    of 29 mm Hg. Wall motion was normal; there were no regional wall    motion abnormalities. Doppler parameters are consistent with    abnormal left ventricular relaxation (grade 1 diastolic    dysfunction). The E/e&' ratio is >15, suggesting elevated LV    filling pressure.  - Aortic valve: Trileaflet. Sclerosis without stenosis. Mean    gradient (S): 10 mm Hg. Peak gradient (S): 15 mm Hg.  - Tricuspid valve: There was moderate regurgitation.  - Pulmonary arteries: PA peak pressure: 37 mm Hg (S).  - Inferior vena cava: The vessel was normal in size. The    respirophasic diameter changes were in the normal range (>= 50%),    consistent with normal central venous pressure.  - Pericardium, extracardiac: Small circumferential pericardial    effusion.   Recent Labs: 02/21/2021: BUN 19; Creatinine, Ser 0.97; Potassium 4.8; Sodium 143   Recent Lipid Panel    Component Value Date/Time   CHOL 242 (H) 07/12/2014 1153   TRIG 93.0 07/12/2014 1153   HDL 78.80 07/12/2014 1153   CHOLHDL 3 07/12/2014 1153   VLDL 18.6 07/12/2014 1153   LDLCALC 145 (H) 07/12/2014 1153    Physical Exam:   VS:  BP (!) 128/56 (BP  Location: Left Arm, Patient Position: Sitting, Cuff Size: Normal)   Pulse (!) 51   Ht 5\' 3"  (1.6 m)   Wt 122 lb (55.3 kg)   BMI 21.61 kg/m    Wt Readings from Last 3 Encounters:  09/12/21 122 lb (55.3 kg)  07/03/21 119 lb 8 oz (54.2 kg)  02/21/21 122 lb 3.2 oz (55.4 kg)    General: Well nourished, well developed, in no acute distress Head: Atraumatic, normal size  Eyes: PEERLA, EOMI  Neck: Supple, no JVD Endocrine: No thryomegaly Cardiac: Normal S1, S2; RRR; 3 out of 6 systolic ejection murmur, late peaking Lungs: Clear to auscultation bilaterally, no wheezing, rhonchi or rales  Abd: Soft, nontender, no hepatomegaly  Ext: No edema, pulses 2+ Musculoskeletal: No deformities, BUE and BLE strength normal and equal Skin: Warm and dry, no rashes   Neuro: Alert and oriented to person, place, time, and situation, CNII-XII grossly intact, no focal deficits  Psych: Normal mood and affect   ASSESSMENT:   ARIYANAH SIMMS is a 88 y.o. female who presents for the following: 1. Bradycardia   2. LAFB (left anterior fascicular block)   3. Nonrheumatic aortic valve stenosis   4. Primary hypertension     PLAN:   1. Bradycardia 2. LAFB (left anterior fascicular block) -Incidentally found.  No real symptoms from this.  Recent TSH is normal.  They have cut back on her Aricept with some improvement.  Both Aricept and Namenda are known to cause bradycardia.  They also cut back on her amlodipine.  Given her normal blood pressure that is low at home I would recommend to stop amlodipine.  This is a dihydropyridine calcium channel blocker which is not commonly associated bradycardia but is possible.  We will go ahead and stop this.  She also has a prominent murmur of aortic stenosis.  There is a common association between aortic stenosis and bradycardia.  She has had progression and she will need a repeat echo.  However, she really has no symptoms.  Would recommend she continue her Aricept and Namenda as  prescribed.  We will stop the amlodipine.  Her daughter will look out for symptoms such as dizziness or lightheadedness but she has none.  She can do as much activity as she likes.  I have recommended to drink plenty of water.  We will keep a close eye on her.  She will see me back in 1 year and/or sooner based on the results of her echo.  3. Nonrheumatic aortic valve stenosis -Mild aortic stenosis in 2016.  Repeat echo.  Murmur suggest that she has had progression.  4. Primary hypertension -Stop amlodipine.  BPs are low at home.  Could be contributed bradycardia.  She will keep a close eye on her blood pressure but I would recommend a very lenient blood pressure goal given her age and dementia.  Disposition: Return if symptoms worsen or fail to improve.  Medication Adjustments/Labs and Tests Ordered: Current medicines are reviewed at length with the patient today.  Concerns regarding medicines are outlined above.  Orders Placed This Encounter  Procedures   EKG 12-Lead   ECHOCARDIOGRAM COMPLETE   No orders of the defined types were placed in this encounter.   Patient Instructions  Medication Instructions:  STOP Amlodipine   *If you need a refill on your cardiac medications before your next appointment, please call your pharmacy*   Testing/Procedures: Echocardiogram - Your physician has requested that you have an echocardiogram. Echocardiography is a painless test that uses sound waves to create images of your heart. It provides your doctor with information about the size and shape of your heart and how well your heart's chambers and valves are working. This procedure takes approximately one hour. There are no restrictions for this procedure.     Follow-Up: At [facility], you and your health needs are our priority.  As part of our continuing mission to provide you with exceptional heart care, we have created designated Provider Care Teams.  These Care Teams include your primary  Cardiologist (physician) and Advanced Practice Providers (APPs -  Physician Assistants and Nurse Practitioners) who all work together to provide you with the care you need, when you need it.  We recommend signing up for the patient portal called "MyChart".  Sign up information is provided on this After Visit Summary.  MyChart is used to connect with patients for Virtual Visits (Telemedicine).  Patients are able to view lab/test results, encounter notes, upcoming appointments, etc.  Non-urgent messages can be sent to your provider as well.   To learn more about what you can do with MyChart, go to [email or web address].    Your next appointment:   12 month(s)  The format for your next appointment:   In Person  Provider:   Lennie Odor, MD            Signed, Lenna Gilford. Flora Lipps, MD, [facility]  [facility]  [address] [place], [place] [address] [phone]  09/12/2021 3:49 PM

## 2021-09-18 DIAGNOSIS — S72341D Displaced spiral fracture of shaft of right femur, subsequent encounter for closed fracture with routine healing: Secondary | ICD-10-CM | POA: Diagnosis not present

## 2021-10-02 ENCOUNTER — Ambulatory Visit (INDEPENDENT_AMBULATORY_CARE_PROVIDER_SITE_OTHER): Payer: Medicare PPO

## 2021-10-02 ENCOUNTER — Encounter: Payer: Self-pay | Admitting: Internal Medicine

## 2021-10-02 DIAGNOSIS — M810 Age-related osteoporosis without current pathological fracture: Secondary | ICD-10-CM | POA: Diagnosis not present

## 2021-10-02 MED ORDER — DENOSUMAB 60 MG/ML ~~LOC~~ SOSY
60.0000 mg | PREFILLED_SYRINGE | Freq: Once | SUBCUTANEOUS | Status: AC
  Administered 2021-10-02: 60 mg via SUBCUTANEOUS

## 2021-10-02 NOTE — Progress Notes (Signed)
Patient seen today for Prolia injection.  60mg/ml given in left arm subQ.  Patient tolerated well.  Follow up 6 months or as advised by provider.Patient verified name, DOB and provided verbal consent prior to administration. 

## 2021-10-03 ENCOUNTER — Ambulatory Visit (HOSPITAL_COMMUNITY): Payer: Medicare PPO | Attending: Cardiology

## 2021-10-03 DIAGNOSIS — I350 Nonrheumatic aortic (valve) stenosis: Secondary | ICD-10-CM | POA: Diagnosis not present

## 2021-10-03 LAB — ECHOCARDIOGRAM COMPLETE
AR max vel: 1.46 cm2
AV Area VTI: 1.52 cm2
AV Area mean vel: 1.44 cm2
AV Mean grad: 13.6 mmHg
AV Peak grad: 23.5 mmHg
Ao pk vel: 2.43 m/s
Area-P 1/2: 2.26 cm2
S' Lateral: 1.9 cm

## 2021-10-10 NOTE — Telephone Encounter (Signed)
Last Prolia inj 10/02/21 Next Prolia inj due 04/05/22 

## 2021-10-16 ENCOUNTER — Telehealth: Payer: Self-pay | Admitting: Cardiovascular Disease

## 2021-10-16 DIAGNOSIS — M810 Age-related osteoporosis without current pathological fracture: Secondary | ICD-10-CM | POA: Diagnosis not present

## 2021-10-16 DIAGNOSIS — F0390 Unspecified dementia without behavioral disturbance: Secondary | ICD-10-CM | POA: Diagnosis not present

## 2021-10-16 DIAGNOSIS — F5101 Primary insomnia: Secondary | ICD-10-CM | POA: Diagnosis not present

## 2021-10-16 DIAGNOSIS — I10 Essential (primary) hypertension: Secondary | ICD-10-CM | POA: Diagnosis not present

## 2021-10-16 NOTE — Telephone Encounter (Signed)
Follow Up:     Patient's daughter is calling to see if patient's Echo are ready from 10-03-21

## 2021-10-16 NOTE — Telephone Encounter (Signed)
Jeanne Rives, MD  10/04/2021  2:25 PM EDT     Mild to moderate AS. She needs to see [place] yearly.    [name] T. Flora Lipps, MD, [facility]  [facility]  [address], [address] [place], [place] [address] [phone]  2:25 PM    Spoke to daughter-aware of results and verbalized understanding.  She will call with any new/worsening symptoms.

## 2021-10-22 ENCOUNTER — Telehealth: Payer: Self-pay | Admitting: Neurology

## 2021-10-22 NOTE — Telephone Encounter (Signed)
I returned pt's daughter call. Reports she had cardiology evaluation on 09/12/2021 and not concerns noted on assessment. She was taken off amlodipine.   Daughter wanted to know if it would be ok to resume the aricept 10 mg daily? She has been taking the 5 mg daily until f/u with cardiologist took place.   Daughter also wanted to report over the last 30 days pt has had 3 noticeably stronger h/a than in the past. Daughter reports the sumatriptan works when used but 3 within a month is higher than her normal frequency.   I advised I would send message to NP updating on this information.

## 2021-10-22 NOTE — Telephone Encounter (Signed)
I called pt's daughter back. No answer, left a message asking  her to call me back.

## 2021-10-22 NOTE — Telephone Encounter (Signed)
Pt's daughter called back and we discussed recommendation. She verbalized understanding and agreement to plan. At this time she will continue to monitor headaches if number increases she will let us know and we could readdress ubrelvy. Daughter reports she will also begin with keeping a log her moms headaches.

## 2021-10-22 NOTE — Telephone Encounter (Signed)
I reviewed cardiology note, they recommended continuing Aricept and Namenda as prescribed.  I would continue Aricept at the lower dose which is 5 mg daily.  I would NOT increase it back up to 10 mg. For headaches, I would monitor for now, would she be open to trying 1 of the newer rescue medications for migraine such as Ubrelvy for acute headache treatment, given her age concern with triptan use? I would try Ubrelvy 50 mg as needed if she is willing.

## 2021-10-22 NOTE — Telephone Encounter (Signed)
Pt daughter ( May) is calling. Stated Pt went to Cardiologist and had some test ran. May is calling to discuss medication  donepezil (ARICEPT) 10 MG tablet to see if Maralyn Sago wants to go back to 10 mg. May stated Pt have had several headaches this summer. May is requesting a call back from nurse

## 2021-10-29 DIAGNOSIS — Z961 Presence of intraocular lens: Secondary | ICD-10-CM | POA: Diagnosis not present

## 2021-10-29 DIAGNOSIS — H5203 Hypermetropia, bilateral: Secondary | ICD-10-CM | POA: Diagnosis not present

## 2021-10-29 DIAGNOSIS — H52223 Regular astigmatism, bilateral: Secondary | ICD-10-CM | POA: Diagnosis not present

## 2021-10-29 DIAGNOSIS — H524 Presbyopia: Secondary | ICD-10-CM | POA: Diagnosis not present

## 2021-10-29 DIAGNOSIS — H3540 Unspecified peripheral retinal degeneration: Secondary | ICD-10-CM | POA: Diagnosis not present

## 2021-10-29 DIAGNOSIS — Z9849 Cataract extraction status, unspecified eye: Secondary | ICD-10-CM | POA: Diagnosis not present

## 2022-02-20 ENCOUNTER — Encounter: Payer: Self-pay | Admitting: Internal Medicine

## 2022-02-20 ENCOUNTER — Ambulatory Visit: Payer: Medicare PPO | Admitting: Internal Medicine

## 2022-02-20 VITALS — BP 128/74 | HR 56 | Ht 63.0 in | Wt 125.4 lb

## 2022-02-20 DIAGNOSIS — E559 Vitamin D deficiency, unspecified: Secondary | ICD-10-CM

## 2022-02-20 DIAGNOSIS — M810 Age-related osteoporosis without current pathological fracture: Secondary | ICD-10-CM | POA: Diagnosis not present

## 2022-02-20 NOTE — Patient Instructions (Addendum)
Please continue Prolia.  Let me know about the most recent vitamin D, calcium and kidney function from Dr. Tamala Julian.  You should have an endocrinology follow-up appointment in 1 year.

## 2022-02-20 NOTE — Progress Notes (Signed)
Patient ID: Jeanne Lopez, female   DOB: August 23, 1932, 89 y.o.   MRN: [id number] HPI  Jeanne Lopez is a 89 y.o.-year-old female, initially referred by Dr. Jeremy Johann, presenting for follow-up for osteoporosis. Last visit 1 year ago. She is here with her daughter who offers most of the history, especially related to her medical history, symptoms, lab tests, medications.  Interim history: No falls or fractures since last visit. No vertigo or vision problems. She feels well, without complaints today.  Reviewed and addended history: Pt was dx with OP in her 95s.  She had: a vertebral fracture found on VFA analysis (01/2013): moderate wedge, L2. Reviewing the chart, she also had a right foot fracture (12/2000). Closed displaced spiral fracture of the right femur (10/05/2019) - after a fall in the yard (lost footing).  She had a rod implanted >> was in rehab >> healed well.  She also has dizziness/vertigo (Meniere's ds.) -Controlled.  Reviewed patient's DXA scan reports:  Date L1-L4 (L2)T score RFN T score LFN T score 33% distal Radius  05/15/2021 ([place], [place]) L1-L4: -2.3 Jeanne Lopez (h/o R hip fracture) Total left femur: -2.9 -3.3  Ultra distal radius: -6.1  07/16/2015 [facility], [place])  -2.8(+6.8%*)  -2.4 -2.9 -3.9  01/26/2013 ([facility] [place]) -3.2 -2.5 -3.0 -4.4  08/07/2008 ([place] [facility]) L1-L4: -3.0 -3.0 (however, the femur not properly rotated, ROI not properly defined) -2.8 (however, the femur not properly rotated, ROI not properly defined)  Jeanne Lopez  12/16/2000 L1-L4: -3.7 Total Hip: -3.4    12/05/1998 L1-L4: -3.8 Total Hip: -3.3    05/18/1997 L1-L4: -3.7 Total Hip: -3.3     Reviewed previous and current osteoporosis treatment:  - estrogen 1976-1986 - on Fosamax 1986-2003 - on Boniva 2004-2015 -On Prolia: 09/15/2013, 05/30/2014, 11/2014, 06/11/2015, 04/17/2016, 11/12/2016, 06/30/2017, 01/19/2018, 05/30/2020 03/29/2021 and 10/02/2021  No history of  hyperparathyroidism.  No history of hyperparathyroidism.  No history of kidney stones.  She has had some low calcium readings from when she was in the hospital in 03/2018 and 09/2019, most likely due to IV fluids: 09/18/2020: Calcium 9.89 - normal Lab Results  Component Value Date   PTH 25.9 08/11/2013   CALCIUM 10.1 02/21/2021   CALCIUM 8.9 10/11/2019   CALCIUM 8.8 (L) 10/10/2019   CALCIUM 8.8 (L) 10/09/2019   CALCIUM 8.7 (L) 10/08/2019   CALCIUM 8.3 (L) 10/07/2019   CALCIUM 9.3 10/05/2019   CALCIUM 8.5 (L) 03/22/2018   CALCIUM 8.1 (L) 03/21/2018   CALCIUM 8.2 (L) 03/20/2018   No history of thyrotoxicosis: 03/21/2020: TSH 2.74 Lab Results  Component Value Date   TSH 3.874 03/20/2018   Vitamin D levels were reviewed: Lab Results  Component Value Date   VD25OH 57.97 02/21/2021   VD25OH 24.62 (L) 10/07/2019   VD25OH 48.05 07/08/2017   VD25OH 39.30 07/08/2016   VD25OH 48.51 06/11/2015   VD25OH 34.91 06/09/2014   She continues on vitamin D 1000 units daily.  She eats plenty of green leafy vegetables, and not much dairy.  No CKD. Last BUN/Cr: Lab Results  Component Value Date   BUN 19 02/21/2021   CREATININE 0.97 02/21/2021   She is walking with her dog for exercise.  ROS: + back pain + Per HPI  I reviewed pt's medications, allergies, PMH, social hx, family hx, and changes were documented in the history of present illness. Otherwise, unchanged from my initial visit note.  Past Medical History:  Diagnosis Date   Anxiety    Depression  Heart murmur    Hypertension    Insomnia    Memory disorder 04/02/2017   Meniere's disease    Migraine    Osteoporosis    Past Surgical History:  Procedure Laterality Date   APPENDECTOMY     BREAST LUMPECTOMY Right    DILATION AND CURETTAGE OF UTERUS     FEMUR IM NAIL Right 10/06/2019   Procedure: INTRAMEDULLARY (IM) NAIL FEMORAL;  Surgeon: Myrene Galas, MD;  Location: [facility];  Service: Orthopedics;  Laterality: Right;    History   Social History   Marital Status: Widowed    Spouse Name: Jeanne Lopez    Number of Children: 3   Social History Main Topics   Smoking status: Never Smoker    Smokeless tobacco: Not on file   Alcohol Use: No   Drug Use: No   Social History Narrative   Lives with daughter and dog. Grandchildren in frequently to help her.    Retired.    Current Outpatient Medications on File Prior to Visit  Medication Sig Dispense Refill   donepezil (ARICEPT) 10 MG tablet Take 1 tablet (10 mg total) by mouth at bedtime. (Patient taking differently: Take 5 mg by mouth at bedtime.) 90 tablet 3   gabapentin (NEURONTIN) 100 MG capsule TAKE (1) CAPSULE TWICE DAILY. 180 capsule 3   memantine (NAMENDA) 10 MG tablet Take 1 tablet (10 mg total) by mouth 2 (two) times daily. 180 tablet 3   polyethylene glycol (MIRALAX / GLYCOLAX) packet Take 17 g by mouth daily as needed for mild constipation.      senna (SENOKOT) 8.6 MG TABS tablet Take 1 tablet by mouth.     SUMAtriptan (IMITREX) 50 MG tablet Take 1 tablet (50 mg total) by mouth every 2 (two) hours as needed. May repeat in 2 hours if headache persists or recurs. 10 tablet 3   traZODone (DESYREL) 100 MG tablet Take 1 tablet (100 mg total) by mouth at bedtime.     venlafaxine XR (EFFEXOR-XR) 150 MG 24 hr capsule Take 150 mg by mouth daily.     No current facility-administered medications on file prior to visit.   Allergies  Allergen Reactions   Codeine Nausea And Vomiting   FH: see HPI + - HTN: in sister, mother - CVA: in MGM  PE: BP 128/74 (BP Location: Left Arm, Patient Position: Sitting, Cuff Size: Normal)   Pulse (!) 56   Ht 5\' 3"  (1.6 m)   Wt 125 lb 6.4 oz (56.9 kg)   SpO2 94%   BMI 22.21 kg/m   Wt Readings from Last 3 Encounters:  02/20/22 125 lb 6.4 oz (56.9 kg)  09/12/21 122 lb (55.3 kg)  07/03/21 119 lb 8 oz (54.2 kg)   Constitutional: normal weight, in NAD Eyes:  EOMI, no exophthalmos ENT: no neck masses, no cervical  lymphadenopathy Cardiovascular: bradycardia, RR, No RG, + 1/6 SEM Respiratory: CTA B Musculoskeletal: no deformities Skin:no rashes Neurological: no tremor with outstretched hands  Assessment: 1. Osteoporosis - 1 incidental vb fx: moderate wedge - L2 >> healed - s/p 20 years of Bisphosphonates - no SEs - on Boniva previously, now on Prolia   2.  Vitamin D insufficiency  Plan: 1. Osteoporosis -age-related/postmenopausal -pt was started on Prolia in 09/2013, but she had a period of time off the medication after 8 injections (last in 01/2018).  At that time, she was lost for follow-up for 2 years and 8 months.  She ended up having a right femoral fracture  in 09/2019.  We discussed at that time that it is likely that most of the Prolia effect was lost before her fracture and she should not leave more than 6 to 7 months between Prolia injections.  Prolia was resumed in 05/2020.  She continues to do this consistently afterwards.  Since last visit, she had 2 injections, on 03/29/2021 and 10/02/2021 -She tolerates Prolia well, without side effects -We did discuss at last visit about the possibility of switching to Reclast but she opted to continue Prolia -We also discussed about possibly trying skeletal loading, but she declined. -At today's visit, we  discussed about possibly continuing with Prolia for 10 years or more.  I would consider the starting point 05/2020. -At today's visit, they tell me that she had labs by PCP 2 months ago.  Will try to get the records.  -She had another bone density since last visit and this showed improved T-scores.  We reviewed this report together with patient and her daughter. -Plan to repeat another bone density scan 2 years from the previous.  We discussed about the importance of taking on the same machine. -I will see her back in 1 year  2.  Vitamin D insufficiency -She continues on 1000 units vitamin D daily -Latest level was normal a year ago -At today's  visit, they tell me that they believe this was checked again at the last visit with PCP 2 months ago.  Daughter will look at home and let me know.  If not, will check the level when she returns for her Prolia injection in 03/2022.  Philemon Kingdom, MD PhD [facility]

## 2022-02-27 NOTE — Telephone Encounter (Signed)
Prolia VOB initiated via [email or web address]  Last Prolia inj 10/02/21 Next Prolia inj due 04/05/22    

## 2022-03-10 NOTE — Telephone Encounter (Signed)
Prior auth renewal required for PROLIA  PA PROCESS DETAILS: PA is required. PA can be initiated by calling [phone] or online at [email or web address].   

## 2022-03-14 NOTE — Telephone Encounter (Signed)
Prior Authorization initiated for PROLIA via [email or web address] KEY: [id number], PA Case ID #: [id number]

## 2022-03-19 NOTE — Telephone Encounter (Signed)
PA# [id number] Valid: 03/06/21-02/10/23

## 2022-03-20 NOTE — Telephone Encounter (Signed)
Pt ready for scheduling on or after 04/05/22  Out-of-pocket cost due at time of visit: $0  Primary: [employer] [place] Prolia co-insurance: 0% Admin fee co-insurance: 0%  Secondary: n/a Prolia co-insurance:  Admin fee co-insurance:   Deductible: does not apply  Prior Auth: APPROVED PA# [id number] Valid: 03/06/21-02/10/23      ** This summary of benefits is an estimation of the patient's out-of-pocket cost. Exact cost may very based on individual plan coverage.

## 2022-04-01 DIAGNOSIS — Z0000 Encounter for general adult medical examination without abnormal findings: Secondary | ICD-10-CM | POA: Diagnosis not present

## 2022-04-01 DIAGNOSIS — M810 Age-related osteoporosis without current pathological fracture: Secondary | ICD-10-CM | POA: Diagnosis not present

## 2022-04-01 DIAGNOSIS — I071 Rheumatic tricuspid insufficiency: Secondary | ICD-10-CM | POA: Diagnosis not present

## 2022-04-01 DIAGNOSIS — Z1331 Encounter for screening for depression: Secondary | ICD-10-CM | POA: Diagnosis not present

## 2022-04-01 DIAGNOSIS — G43909 Migraine, unspecified, not intractable, without status migrainosus: Secondary | ICD-10-CM | POA: Diagnosis not present

## 2022-04-01 DIAGNOSIS — G4700 Insomnia, unspecified: Secondary | ICD-10-CM | POA: Diagnosis not present

## 2022-04-01 DIAGNOSIS — E7800 Pure hypercholesterolemia, unspecified: Secondary | ICD-10-CM | POA: Diagnosis not present

## 2022-04-01 DIAGNOSIS — I10 Essential (primary) hypertension: Secondary | ICD-10-CM | POA: Diagnosis not present

## 2022-04-01 DIAGNOSIS — K5900 Constipation, unspecified: Secondary | ICD-10-CM | POA: Diagnosis not present

## 2022-04-08 ENCOUNTER — Ambulatory Visit (INDEPENDENT_AMBULATORY_CARE_PROVIDER_SITE_OTHER): Payer: Medicare PPO

## 2022-04-08 VITALS — BP 114/68 | Ht 63.0 in | Wt 125.0 lb

## 2022-04-08 DIAGNOSIS — M810 Age-related osteoporosis without current pathological fracture: Secondary | ICD-10-CM | POA: Diagnosis not present

## 2022-04-08 MED ORDER — DENOSUMAB 60 MG/ML ~~LOC~~ SOSY
60.0000 mg | PREFILLED_SYRINGE | Freq: Once | SUBCUTANEOUS | Status: AC
  Administered 2022-04-08: 60 mg via SUBCUTANEOUS

## 2022-04-08 NOTE — Progress Notes (Signed)
Patient verbally confirmed name, date of birth, and correct medication to be administered. Prolia injection administered and pt tolerated well.  

## 2022-04-09 NOTE — Telephone Encounter (Signed)
Last Prolia inj 04/08/22 Next Prolia inj due 10/08/22

## 2022-05-08 ENCOUNTER — Emergency Department (HOSPITAL_COMMUNITY): Payer: Medicare PPO

## 2022-05-08 ENCOUNTER — Inpatient Hospital Stay (HOSPITAL_COMMUNITY): Payer: Medicare PPO

## 2022-05-08 ENCOUNTER — Other Ambulatory Visit: Payer: Self-pay

## 2022-05-08 ENCOUNTER — Encounter (HOSPITAL_COMMUNITY): Payer: Self-pay

## 2022-05-08 ENCOUNTER — Observation Stay (HOSPITAL_COMMUNITY)
Admission: EM | Admit: 2022-05-08 | Discharge: 2022-05-09 | Disposition: A | Discharge status: Home Health | Payer: Medicare PPO | Attending: Internal Medicine | Admitting: Internal Medicine

## 2022-05-08 DIAGNOSIS — R4182 Altered mental status, unspecified: Principal | ICD-10-CM | POA: Insufficient documentation

## 2022-05-08 DIAGNOSIS — R531 Weakness: Secondary | ICD-10-CM | POA: Diagnosis not present

## 2022-05-08 DIAGNOSIS — F0390 Unspecified dementia without behavioral disturbance: Secondary | ICD-10-CM

## 2022-05-08 DIAGNOSIS — Z79899 Other long term (current) drug therapy: Secondary | ICD-10-CM | POA: Diagnosis not present

## 2022-05-08 DIAGNOSIS — I10 Essential (primary) hypertension: Secondary | ICD-10-CM | POA: Diagnosis present

## 2022-05-08 DIAGNOSIS — R29898 Other symptoms and signs involving the musculoskeletal system: Secondary | ICD-10-CM | POA: Diagnosis not present

## 2022-05-08 DIAGNOSIS — W19XXXA Unspecified fall, initial encounter: Secondary | ICD-10-CM | POA: Diagnosis not present

## 2022-05-08 DIAGNOSIS — J069 Acute upper respiratory infection, unspecified: Secondary | ICD-10-CM | POA: Insufficient documentation

## 2022-05-08 DIAGNOSIS — R0902 Hypoxemia: Secondary | ICD-10-CM | POA: Diagnosis not present

## 2022-05-08 DIAGNOSIS — E785 Hyperlipidemia, unspecified: Secondary | ICD-10-CM | POA: Diagnosis present

## 2022-05-08 DIAGNOSIS — F418 Other specified anxiety disorders: Secondary | ICD-10-CM | POA: Diagnosis present

## 2022-05-08 DIAGNOSIS — Z1152 Encounter for screening for COVID-19: Secondary | ICD-10-CM | POA: Diagnosis not present

## 2022-05-08 DIAGNOSIS — I129 Hypertensive chronic kidney disease with stage 1 through stage 4 chronic kidney disease, or unspecified chronic kidney disease: Secondary | ICD-10-CM | POA: Insufficient documentation

## 2022-05-08 DIAGNOSIS — G319 Degenerative disease of nervous system, unspecified: Secondary | ICD-10-CM | POA: Diagnosis not present

## 2022-05-08 DIAGNOSIS — N189 Chronic kidney disease, unspecified: Secondary | ICD-10-CM | POA: Diagnosis not present

## 2022-05-08 DIAGNOSIS — M6281 Muscle weakness (generalized): Secondary | ICD-10-CM | POA: Insufficient documentation

## 2022-05-08 DIAGNOSIS — R413 Other amnesia: Secondary | ICD-10-CM | POA: Diagnosis present

## 2022-05-08 DIAGNOSIS — I639 Cerebral infarction, unspecified: Secondary | ICD-10-CM

## 2022-05-08 DIAGNOSIS — B9789 Other viral agents as the cause of diseases classified elsewhere: Secondary | ICD-10-CM | POA: Diagnosis not present

## 2022-05-08 DIAGNOSIS — R262 Difficulty in walking, not elsewhere classified: Secondary | ICD-10-CM | POA: Diagnosis not present

## 2022-05-08 DIAGNOSIS — J3489 Other specified disorders of nose and nasal sinuses: Secondary | ICD-10-CM | POA: Diagnosis not present

## 2022-05-08 LAB — CBC WITH DIFFERENTIAL/PLATELET
Abs Immature Granulocytes: 0.05 10*3/uL (ref 0.00–0.07)
Basophils Absolute: 0.1 10*3/uL (ref 0.0–0.1)
Basophils Relative: 0 %
Eosinophils Absolute: 0 10*3/uL (ref 0.0–0.5)
Eosinophils Relative: 0 %
HCT: 41.2 % (ref 36.0–46.0)
Hemoglobin: 13.4 g/dL (ref 12.0–15.0)
Immature Granulocytes: 0 %
Lymphocytes Relative: 12 %
Lymphs Abs: 1.4 10*3/uL (ref 0.7–4.0)
MCH: 31.5 pg (ref 26.0–34.0)
MCHC: 32.5 g/dL (ref 30.0–36.0)
MCV: 96.9 fL (ref 80.0–100.0)
Monocytes Absolute: 1.2 10*3/uL — ABNORMAL HIGH (ref 0.1–1.0)
Monocytes Relative: 11 %
Neutro Abs: 8.9 10*3/uL — ABNORMAL HIGH (ref 1.7–7.7)
Neutrophils Relative %: 77 %
Platelets: 148 10*3/uL — ABNORMAL LOW (ref 150–400)
RBC: 4.25 MIL/uL (ref 3.87–5.11)
RDW: 14.4 % (ref 11.5–15.5)
WBC: 11.6 10*3/uL — ABNORMAL HIGH (ref 4.0–10.5)
nRBC: 0 % (ref 0.0–0.2)

## 2022-05-08 LAB — RESP PANEL BY RT-PCR (RSV, FLU A&B, COVID)  RVPGX2
Influenza A by PCR: NEGATIVE
Influenza B by PCR: NEGATIVE
Resp Syncytial Virus by PCR: NEGATIVE
SARS Coronavirus 2 by RT PCR: NEGATIVE

## 2022-05-08 LAB — COMPREHENSIVE METABOLIC PANEL
ALT: 17 U/L (ref 0–44)
AST: 27 U/L (ref 15–41)
Albumin: 3.5 g/dL (ref 3.5–5.0)
Alkaline Phosphatase: 61 U/L (ref 38–126)
Anion gap: 8 (ref 5–15)
BUN: 13 mg/dL (ref 8–23)
CO2: 25 mmol/L (ref 22–32)
Calcium: 8.6 mg/dL — ABNORMAL LOW (ref 8.9–10.3)
Chloride: 104 mmol/L (ref 98–111)
Creatinine, Ser: 0.84 mg/dL (ref 0.44–1.00)
GFR, Estimated: >60 mL/min (ref >60)
Glucose, Bld: 115 mg/dL — ABNORMAL HIGH (ref 70–99)
Potassium: 3.8 mmol/L (ref 3.5–5.1)
Sodium: 137 mmol/L (ref 135–145)
Total Bilirubin: 0.9 mg/dL (ref 0.3–1.2)
Total Protein: 6.1 g/dL — ABNORMAL LOW (ref 6.5–8.1)

## 2022-05-08 LAB — URINALYSIS, ROUTINE W REFLEX MICROSCOPIC
Bilirubin Urine: NEGATIVE
Glucose, UA: NEGATIVE mg/dL
Hgb urine dipstick: NEGATIVE
Ketones, ur: NEGATIVE mg/dL
Leukocytes,Ua: NEGATIVE
Nitrite: NEGATIVE
Protein, ur: NEGATIVE mg/dL
Specific Gravity, Urine: 1.011 (ref 1.005–1.030)
pH: 6 (ref 5.0–8.0)

## 2022-05-08 LAB — BLOOD GAS, VENOUS
Acid-Base Excess: 1 mmol/L (ref 0.0–2.0)
Bicarbonate: 26 mmol/L (ref 20.0–28.0)
O2 Saturation: 55.7 %
Patient temperature: 37
pCO2, Ven: 42 mmHg — ABNORMAL LOW (ref 44–60)
pH, Ven: 7.4 (ref 7.25–7.43)
pO2, Ven: 32 mmHg (ref 32–45)

## 2022-05-08 LAB — CBC
HCT: 36.5 % (ref 36.0–46.0)
Hemoglobin: 12 g/dL (ref 12.0–15.0)
MCH: 31.5 pg (ref 26.0–34.0)
MCHC: 32.9 g/dL (ref 30.0–36.0)
MCV: 95.8 fL (ref 80.0–100.0)
Platelets: 141 10*3/uL — ABNORMAL LOW (ref 150–400)
RBC: 3.81 MIL/uL — ABNORMAL LOW (ref 3.87–5.11)
RDW: 14.4 % (ref 11.5–15.5)
WBC: 10.6 10*3/uL — ABNORMAL HIGH (ref 4.0–10.5)
nRBC: 0 % (ref 0.0–0.2)

## 2022-05-08 LAB — LIPID PANEL
Cholesterol: 184 mg/dL (ref 0–200)
HDL: 60 mg/dL (ref >40)
LDL Cholesterol: 112 mg/dL — ABNORMAL HIGH (ref 0–99)
Total CHOL/HDL Ratio: 3.1 RATIO
Triglycerides: 58 mg/dL (ref <150)
VLDL: 12 mg/dL (ref 0–40)

## 2022-05-08 LAB — TROPONIN I (HIGH SENSITIVITY)
Troponin I (High Sensitivity): 20 ng/L — ABNORMAL HIGH (ref <18)
Troponin I (High Sensitivity): 21 ng/L — ABNORMAL HIGH (ref <18)

## 2022-05-08 LAB — TSH: TSH: 1.29 u[IU]/mL (ref 0.350–4.500)

## 2022-05-08 LAB — CREATININE, SERUM
Creatinine, Ser: 0.96 mg/dL (ref 0.44–1.00)
GFR, Estimated: 57 mL/min — ABNORMAL LOW (ref >60)

## 2022-05-08 LAB — CK: Total CK: 118 U/L (ref 38–234)

## 2022-05-08 LAB — MAGNESIUM: Magnesium: 2 mg/dL (ref 1.7–2.4)

## 2022-05-08 MED ORDER — ACETAMINOPHEN 650 MG RE SUPP: 650.0000 mg | Freq: Four times a day (QID) | RECTAL | Status: DC | PRN

## 2022-05-08 MED ORDER — ASPIRIN 325 MG PO TBEC
325.0000 mg | DELAYED_RELEASE_TABLET | Freq: Every day | ORAL | Status: DC
  Administered 2022-05-08: 325 mg via ORAL
  Filled 2022-05-08: qty 1

## 2022-05-08 MED ORDER — CLOPIDOGREL BISULFATE 300 MG PO TABS
300.0000 mg | ORAL_TABLET | Freq: Once | ORAL | Status: AC
  Administered 2022-05-08: 300 mg via ORAL
  Filled 2022-05-08: qty 1

## 2022-05-08 MED ORDER — ASPIRIN 81 MG PO TBEC
81.0000 mg | DELAYED_RELEASE_TABLET | Freq: Every day | ORAL | Status: DC
  Administered 2022-05-09: 81 mg via ORAL
  Filled 2022-05-08: qty 1

## 2022-05-08 MED ORDER — SODIUM CHLORIDE 0.9 % IV BOLUS (SEPSIS)
1000.0000 mL | Freq: Once | INTRAVENOUS | Status: AC
  Administered 2022-05-08: 1000 mL via INTRAVENOUS

## 2022-05-08 MED ORDER — ONDANSETRON HCL 4 MG/2ML IJ SOLN
4.0000 mg | Freq: Four times a day (QID) | INTRAMUSCULAR | Status: DC | PRN
  Filled 2022-05-08: qty 2

## 2022-05-08 MED ORDER — MEMANTINE HCL 10 MG PO TABS
10.0000 mg | ORAL_TABLET | Freq: Two times a day (BID) | ORAL | Status: DC
  Administered 2022-05-08 – 2022-05-09 (×2): 10 mg via ORAL
  Filled 2022-05-08 (×2): qty 1

## 2022-05-08 MED ORDER — CLOPIDOGREL BISULFATE 75 MG PO TABS
75.0000 mg | ORAL_TABLET | Freq: Every day | ORAL | Status: DC
  Administered 2022-05-09: 75 mg via ORAL
  Filled 2022-05-08: qty 1

## 2022-05-08 MED ORDER — POLYETHYLENE GLYCOL 3350 17 G PO PACK: 17.0000 g | PACK | Freq: Every day | ORAL | Status: DC | PRN

## 2022-05-08 MED ORDER — TRAZODONE HCL 100 MG PO TABS
100.0000 mg | ORAL_TABLET | Freq: Every day | ORAL | Status: DC
  Administered 2022-05-08: 100 mg via ORAL
  Filled 2022-05-08: qty 1

## 2022-05-08 MED ORDER — ACETAMINOPHEN 325 MG PO TABS: 650.0000 mg | ORAL_TABLET | Freq: Four times a day (QID) | ORAL | Status: DC | PRN

## 2022-05-08 MED ORDER — DONEPEZIL HCL 10 MG PO TABS
10.0000 mg | ORAL_TABLET | Freq: Every day | ORAL | Status: DC
  Administered 2022-05-08: 10 mg via ORAL
  Filled 2022-05-08: qty 1

## 2022-05-08 MED ORDER — DOCUSATE SODIUM 100 MG PO CAPS
100.0000 mg | ORAL_CAPSULE | Freq: Two times a day (BID) | ORAL | Status: DC
  Administered 2022-05-08 – 2022-05-09 (×2): 100 mg via ORAL
  Filled 2022-05-08 (×2): qty 1

## 2022-05-08 MED ORDER — ENOXAPARIN SODIUM 40 MG/0.4ML IJ SOSY
40.0000 mg | PREFILLED_SYRINGE | INTRAMUSCULAR | Status: DC
  Administered 2022-05-08: 40 mg via SUBCUTANEOUS
  Filled 2022-05-08: qty 0.4

## 2022-05-08 MED ORDER — GABAPENTIN 100 MG PO CAPS
100.0000 mg | ORAL_CAPSULE | Freq: Two times a day (BID) | ORAL | Status: DC
  Administered 2022-05-08 – 2022-05-09 (×2): 100 mg via ORAL
  Filled 2022-05-08 (×2): qty 1

## 2022-05-08 MED ORDER — VENLAFAXINE HCL ER 75 MG PO CP24
150.0000 mg | ORAL_CAPSULE | Freq: Every day | ORAL | Status: DC
  Administered 2022-05-09: 150 mg via ORAL
  Filled 2022-05-08: qty 2

## 2022-05-08 MED ORDER — ONDANSETRON HCL 4 MG PO TABS: 4.0000 mg | ORAL_TABLET | Freq: Four times a day (QID) | ORAL | Status: DC | PRN

## 2022-05-08 MED ORDER — DONEPEZIL HCL 5 MG PO TABS: 5.0000 mg | ORAL_TABLET | Freq: Every day | ORAL | Status: DC

## 2022-05-08 NOTE — ED Notes (Signed)
Pt to ct with this RN 

## 2022-05-08 NOTE — Consult Note (Signed)
NEUROLOGY TELECONSULTATION NOTE   Date of service: May 08, 2022 Patient Name: Jeanne Lopez MRN:  [id number] DOB:  [birth year] Reason for consult: telestroke  Requesting Provider: Dr. Georgina Snell Consult Participants: myself, bedside RN, telestroke RN, patient Location of the provider: [facility] Location of the patient: [facility]  This consult was provided via telemedicine with 2-way video and audio communication. The patient/family was informed that care would be provided in this way and agreed to receive care in this manner.   _ _ _   _ __   _ __ _ _  __ __   _ __   __ _  History of Present Illness   89 yo patient with dementia brought in for RLE weakness. LKW 9:30pm last night. Exam significant for RLE drift and disorientation (latter is baseline). NIHSS = 3. RLE weakness has improved somewhat since family called 911. Head CT no acute process on personal review. TNK not administered 2/2 presentation outside the window. CTA not performed 2/2 exam not c/w LVO and high premorbid mRS (oriented only to self at baseline 2/2 dementia).   ROS   UTA 2/2 dementia  Past History   The following was personally reviewed:  Past Medical History:  Diagnosis Date   Anxiety    Depression    Heart murmur    Hypertension    Insomnia    Memory disorder 04/02/2017   Meniere's disease    Migraine    Osteoporosis    Past Surgical History:  Procedure Laterality Date   APPENDECTOMY     BREAST LUMPECTOMY Right    DILATION AND CURETTAGE OF UTERUS     FEMUR IM NAIL Right 10/06/2019   Procedure: INTRAMEDULLARY (IM) NAIL FEMORAL;  Surgeon: Altamese [place], MD;  Location: [place];  Service: Orthopedics;  Laterality: Right;   Family History  Problem Relation Age of Onset   Hypertension Brother    Cancer Sister    Hypertension Sister    Hypertension Mother    Heart attack Mother    Hypertension Father    Stroke Father    Alcohol abuse Daughter    Depression Daughter    Drug abuse Daughter     Alcohol abuse Son    Depression Son    Drug abuse Son    CVA Maternal Grandmother    Kidney disease Neg Hx    Hyperlipidemia Neg Hx    Heart disease Neg Hx    Early death Neg Hx    Arthritis Neg Hx    Social History   Socioeconomic History   Marital status: Widowed    Spouse name: Not on file   Number of children: 3   Years of education: College   Highest education level: Not on file  Occupational History   Occupation: Retired  Tobacco Use   Smoking status: Never   Smokeless tobacco: Never  Vaping Use   Vaping Use: Never used  Substance and Sexual Activity   Alcohol use: No   Drug use: No   Sexual activity: Not Currently  Other Topics Concern   Not on file  Social History Narrative   Lives with daughter and dog.    Grandchildren in frequently to help her.    Retired.    Right handed    Caffeine use: Coffee every morning, 1 glass tea daily   Social Determinants of Health   Financial Resource Strain: Low Risk  (03/18/2018)   Overall Financial Resource Strain (CARDIA)    Difficulty of Paying Living  Expenses: Not hard at all  Food Insecurity: Unknown (03/18/2018)   Hunger Vital Sign    Worried About [place] in the Last Year: Patient declined    [place] in the Last Year: Patient declined  Transportation Needs: Unknown (03/18/2018)   PRAPARE - Hydrologist (Medical): Patient declined    Lack of Transportation (Non-Medical): Patient declined  Physical Activity: Sufficiently Active (03/18/2018)   Exercise Vital Sign    Days of Exercise per Week: 7 days    Minutes of Exercise per Session: 30 min  Stress: No Stress Concern Present (03/18/2018)   [place]    Feeling of Stress : Not at all  Social Connections: Somewhat Isolated (03/18/2018)   Social Connection and Isolation Panel [NHANES]    Frequency of Communication with Friends and Family: More than three times a  week    Frequency of Social Gatherings with Friends and Family: Twice a week    Attends Religious Services: More than 4 times per year    Active Member of [employer] or Organizations: No    Attends Archivist Meetings: Never    Marital Status: Widowed   Allergies  Allergen Reactions   Codeine Nausea And Vomiting    Medications   (Not in a hospital admission)     Current Facility-Administered Medications:    sodium chloride 0.9 % bolus 1,000 mL, 1,000 mL, Intravenous, Once, Georgina Snell C, MD, Last Rate: 500 mL/hr at 05/08/22 1246, 1,000 mL at 05/08/22 1246  Current Outpatient Medications:    donepezil (ARICEPT) 10 MG tablet, Take 1 tablet (10 mg total) by mouth at bedtime. (Patient taking differently: Take 5 mg by mouth at bedtime.), Disp: 90 tablet, Rfl: 3   gabapentin (NEURONTIN) 100 MG capsule, TAKE (1) CAPSULE TWICE DAILY., Disp: 180 capsule, Rfl: 3   memantine (NAMENDA) 10 MG tablet, Take 1 tablet (10 mg total) by mouth 2 (two) times daily., Disp: 180 tablet, Rfl: 3   polyethylene glycol (MIRALAX / GLYCOLAX) packet, Take 17 g by mouth daily as needed for mild constipation. , Disp: , Rfl:    senna (SENOKOT) 8.6 MG TABS tablet, Take 1 tablet by mouth., Disp: , Rfl:    SUMAtriptan (IMITREX) 50 MG tablet, Take 1 tablet (50 mg total) by mouth every 2 (two) hours as needed. May repeat in 2 hours if headache persists or recurs., Disp: 10 tablet, Rfl: 3   traZODone (DESYREL) 100 MG tablet, Take 1 tablet (100 mg total) by mouth at bedtime., Disp: , Rfl:    venlafaxine XR (EFFEXOR-XR) 150 MG 24 hr capsule, Take 150 mg by mouth daily., Disp: , Rfl:   Vitals   Vitals:   05/08/22 1153 05/08/22 1200 05/08/22 1208 05/08/22 1233  BP:   (!) 125/48 98/61  Pulse:   (!) 57 (!) 58  Resp:   19 15  Temp:   98.1 F (36.7 C)   SpO2: 94%  93% 100%  Weight:  57 kg    Height:  5\' 3"  (1.6 m)       Body mass index is 22.26 kg/m.  Physical Exam   Exam performed over telemedicine  with 2-way video and audio communication and with assistance of bedside RN  Physical Exam Gen: A&O x1, NAD Resp: normal WOB CV: extremities appear well-perfused  Neuro: *MS: A&O x1 (baseline). Follows simple commands *Speech: nondysarthric, no aphasia, able to name and repeat *CN:  PERRL 56mm, EOMI, VFF by confrontation, sensation intact, smile symmetric, hearing intact to voice *Motor:   Normal bulk.  No tremor, rigidity or bradykinesia. RLE drift but not to bed, all other extremities full strength *Sensory: SILT. Symmetric. No double-simultaneous extinction.  *Coordination:  Finger-to-nose, heel-to-shin, rapid alternating motions were intact. *Reflexes:  UTA 2/2 tele-exam *Gait: deferred  NIHSS  1a Level of Conscious.: 0 1b LOC Questions: 2 1c LOC Commands: 0 2 Best Gaze: 0 3 Visual: 0 4 Facial Palsy: 0 5a Motor Arm - left: 0 5b Motor Arm - Right: 0 6a Motor Leg - Left: 0 6b Motor Leg - Right: 1 7 Limb Ataxia: 0 8 Sensory: 0 9 Best Language: 0 10 Dysarthria: 0 11 Extinct. and Inatten.: 0  TOTAL: 3 (1 new from RLE, 2 for questions is baseline)   Premorbid mRS = 4   Labs   CBC:  Recent Labs  Lab 05/08/22 1214  WBC 11.6*  NEUTROABS 8.9*  HGB 13.4  HCT 41.2  MCV 96.9  PLT 148*    Basic Metabolic Panel:  Lab Results  Component Value Date   NA 137 05/08/2022   K 3.8 05/08/2022   CO2 25 05/08/2022   GLUCOSE 115 (H) 05/08/2022   BUN 13 05/08/2022   CREATININE 0.84 05/08/2022   CALCIUM 8.6 (L) 05/08/2022   GFRNONAA >60 05/08/2022   GFRAA >60 10/11/2019   Lipid Panel:  Lab Results  Component Value Date   LDLCALC 145 (H) 07/12/2014   HgbA1c: No results found for: "HGBA1C" Urine Drug Screen: No results found for: "LABOPIA", "COCAINSCRNUR", "LABBENZ", "AMPHETMU", "THCU", "LABBARB"  Alcohol Level No results found for: "ETH"  CT Head without contrast: No acute process on personal review  Impression   89 yo patient with dementia brought in for RLE  weakness c/f acute ischemic stroke. LKW 9:30pm last night. Head CT no acute process. TNK not administered 2/2 presentation outside the window.  Recommendations   - Admit to [facility] for stroke workup - Permissive HTN x48 hrs from sx onset or until stroke ruled out by MRI goal BP <220/110. PRN labetalol or hydralazine if BP above these parameters. Avoid oral antihypertensives. - MRI brain wo contrast - CTA or MRA H&N - TTE  - Check A1c and LDL + add statin per guidelines - ASA 81mg  daily + plavix 75mg  daily x21 days f/b ASA 81mg  daily monotherapy after that - q4 hr neuro checks - STAT head CT for any change in neuro exam - Tele - PT/OT/SLP - Stroke education - Amb referral to neurology upon discharge    Notify neurology on patient's arrival to [facility], stroke team will see her tomorrow. ______________________________________________________________________   Thank you for the opportunity to take part in the care of this patient. If you have any further questions, please contact the neurology consultation attending.  Signed,  Su Monks, MD [facility] [phone]  If 7pm- 7am, please page neurology on call as listed in [facility].  **Any copied and pasted documentation in this note was written by me in another application not billed for and pasted by me into this document.

## 2022-05-08 NOTE — H&P (Signed)
History and Physical  Jeanne Lopez [id number] DOB: 1932-06-17 DOA: 05/08/2022  PCP: Carol Ada, MD   Chief Complaint: weakness   HPI: Jeanne Lopez is a 89 y.o. female with medical history significant for anxiety, depression, dementia who lives with her family in the community, and was found on the ground this morning was brought to the the ER for evaluation of global weakness and some slight confusion.  She normally has some slight memory issues, but is otherwise pretty active with her family, this past weekend went out of town on a train ride and did well, when she came back she was quite tired and since then has been having a bit of a wet sounding cough.  However, no fevers chills, no nausea vomiting, chest pain.  She was pretty normal but tired last evening at 9:30 PM when she went to bed and was last seen normal by her family.  This morning, her daughter came to wake her up and found her on the ground.  Patient unable to state why she was on the ground, does not remember anything.   ED Course: She was brought to the ER for evaluation, where workup including vital signs and lab work was unrevealing.  She had some subjective right-sided weakness so code stroke was called.  Recommendations were received from Dr. Quinn Axe of neurology, noncontrast head CT was unremarkable.  Dr. Quinn Axe recommends completing stroke workup, so hospitalist was contacted for admission.  Review of Systems: Please see HPI for pertinent positives and negatives. A complete 10 system review of systems are otherwise negative.  Past Medical History:  Diagnosis Date   Anxiety    Depression    Heart murmur    Hypertension    Insomnia    Memory disorder 04/02/2017   Meniere's disease    Migraine    Osteoporosis    Past Surgical History:  Procedure Laterality Date   APPENDECTOMY     BREAST LUMPECTOMY Right    DILATION AND CURETTAGE OF UTERUS     FEMUR IM NAIL Right 10/06/2019   Procedure: INTRAMEDULLARY (IM)  NAIL FEMORAL;  Surgeon: Altamese [place], MD;  Location: [place];  Service: Orthopedics;  Laterality: Right;    Social History:  reports that she has never smoked. She has never used smokeless tobacco. She reports that she does not drink alcohol and does not use drugs.   Allergies  Allergen Reactions   Codeine Nausea And Vomiting    Family History  Problem Relation Age of Onset   Hypertension Brother    Cancer Sister    Hypertension Sister    Hypertension Mother    Heart attack Mother    Hypertension Father    Stroke Father    Alcohol abuse Daughter    Depression Daughter    Drug abuse Daughter    Alcohol abuse Son    Depression Son    Drug abuse Son    CVA Maternal Grandmother    Kidney disease Neg Hx    Hyperlipidemia Neg Hx    Heart disease Neg Hx    Early death Neg Hx    Arthritis Neg Hx      Prior to Admission medications   Medication Sig Start Date End Date Taking? Authorizing Provider  donepezil (ARICEPT) 10 MG tablet Take 1 tablet (10 mg total) by mouth at bedtime. Patient taking differently: Take 5 mg by mouth at bedtime. 07/03/21   Suzzanne Cloud, NP  gabapentin (NEURONTIN) 100 MG capsule TAKE (1)  CAPSULE TWICE DAILY. 07/03/21   Suzzanne Cloud, NP  memantine (NAMENDA) 10 MG tablet Take 1 tablet (10 mg total) by mouth 2 (two) times daily. 07/03/21   Suzzanne Cloud, NP  polyethylene glycol [facility] / Floria Raveling) packet Take 17 g by mouth daily as needed for mild constipation.     [provider]  senna (SENOKOT) 8.6 MG TABS tablet Take 1 tablet by mouth.    [provider]  SUMAtriptan (IMITREX) 50 MG tablet Take 1 tablet (50 mg total) by mouth every 2 (two) hours as needed. May repeat in 2 hours if headache persists or recurs. 08/26/21   Suzzanne Cloud, NP  traZODone (DESYREL) 100 MG tablet Take 1 tablet (100 mg total) by mouth at bedtime. 03/24/18   Nita Sells, MD  venlafaxine XR (EFFEXOR-XR) 150 MG 24 hr capsule Take 150 mg by mouth daily.  02/12/18   [provider]    Physical Exam: BP 133/80   Pulse 74   Temp 98.1 F (36.7 C)   Resp 15   Ht 5\' 3"  (1.6 m)   Wt 57 kg   SpO2 95%   BMI 22.26 kg/m   General:  Alert, oriented to self and year only, calm, in no acute distress per daughter and grandson at the bedside, she seems to essentially be back to her baseline mental status. Eyes: EOMI, clear conjuctivae, white sclerea Neck: supple, no masses, trachea mildline  Cardiovascular: RRR, no murmurs or rubs, no peripheral edema  Respiratory: clear to auscultation bilaterally, no wheezes, no crackles  Abdomen: soft, nontender, nondistended, normal bowel tones heard  Skin: dry, no rashes  Musculoskeletal: no joint effusions, normal range of motion  Psychiatric: appropriate affect, normal speech  Neurologic: extraocular muscles intact, clear speech, moving all extremities with intact sensorium and normal strength         Labs on Admission:  Basic Metabolic Panel: Recent Labs  Lab 05/08/22 1214  NA 137  K 3.8  CL 104  CO2 25  GLUCOSE 115*  BUN 13  CREATININE 0.84  CALCIUM 8.6*  MG 2.0   Liver Function Tests: Recent Labs  Lab 05/08/22 1214  AST 27  ALT 17  ALKPHOS 61  BILITOT 0.9  PROT 6.1*  ALBUMIN 3.5   No results for input(s): "LIPASE", "AMYLASE" in the last 168 hours. No results for input(s): "AMMONIA" in the last 168 hours. CBC: Recent Labs  Lab 05/08/22 1214  WBC 11.6*  NEUTROABS 8.9*  HGB 13.4  HCT 41.2  MCV 96.9  PLT 148*   Cardiac Enzymes: Recent Labs  Lab 05/08/22 1214  CKTOTAL 118   BNP (last 3 results) No results for input(s): "BNP" in the last 8760 hours.  ProBNP (last 3 results) No results for input(s): "PROBNP" in the last 8760 hours.  CBG: No results for input(s): "GLUCAP" in the last 168 hours.  Radiological Exams on Admission: CT HEAD CODE STROKE WO CONTRAST  Result Date: 05/08/2022 CLINICAL DATA:  Code stroke.  Right-sided weakness EXAM: CT HEAD WITHOUT  CONTRAST TECHNIQUE: Contiguous axial images were obtained from the base of the skull through the vertex without intravenous contrast. RADIATION DOSE REDUCTION: This exam was performed according to the departmental dose-optimization program which includes automated exposure control, adjustment of the mA and/or kV according to patient size and/or use of iterative reconstruction technique. COMPARISON:  Brain MRI 09/06/2013 FINDINGS: Brain: There is no acute intracranial hemorrhage, extra-axial fluid collection, or acute infarct. Parenchymal volume is within expected limits  for age. The ventricles are normal in size. Gray-white differentiation is preserved. Confluent hypodensity in the supratentorial white matter likely reflects sequela of advanced chronic small-vessel ischemic change The pituitary and suprasellar region are normal. There is no mass lesion. There is no mass effect or midline shift. Vascular: No hyperdense vessel is seen. Skull: Normal. Negative for fracture or focal lesion. Sinuses/Orbits: There is layering fluid in the left maxillary and bilateral sphenoid sinuses. Bilateral lens implants are in place. The globes and orbits are otherwise unremarkable. Other: None. ASPECTS [facility] Stroke Program Early CT Score) - Ganglionic level infarction (caudate, lentiform nuclei, internal capsule, insula, M1-M3 cortex): 7 - Supraganglionic infarction (M4-M6 cortex): 3 Total score (0-10 with 10 being normal): 10 IMPRESSION: 1. No acute intracranial pathology. 2. Layering fluid in the left maxillary and sphenoid sinuses can be seen with acute sinusitis in the correct clinical setting These results were called by telephone at the time of interpretation on 05/08/2022 at 1:08 pm to provider Georgina Snell , who verbally acknowledged these results. Electronically Signed   By: Valetta Mole M.D.   On: 05/08/2022 13:17   DG Chest Portable 1 View  Result Date: 05/08/2022 CLINICAL DATA:  Altered mental status EXAM:  PORTABLE CHEST 1 VIEW COMPARISON:  10/05/2019 FINDINGS: Mild cardiomegaly. No focal pulmonary opacity. No pleural effusion or pneumothorax. No acute osseous abnormality. IMPRESSION: No acute cardiopulmonary process. Mild cardiomegaly. Electronically Signed   By: Merilyn Baba M.D.   On: 05/08/2022 12:28    Assessment/Plan Principal Problem:   Acute ischemic stroke (HCC) vs TIA -patient presents with being found down, her mild confusion and global weakness is resolving spontaneously.  No evidence of infection to explain this presentation.  Dr. Quinn Axe of neurology recommends admission for complete stroke workup. -Inpatient admission to [facility] -Formal inpatient neurology consultation requested -Patient can have regular diet after passing nurse bedside swallow screen -Patient was loaded with aspirin and Plavix -Note she seems to be returning rapidly to her normal baseline -Will check MRI brain without contrast, and carotid Dopplers -Will check fasting lipids, as well as hemoglobin A1c -Continue chronic home medications as indicated once reconciled  Active Problems:   Dementia arising in the senium and presenium ([place])   Depression with anxiety   Memory disorder   Chronic kidney disease due to hypertension   Benign essential hypertension   Dyslipidemia  DVT prophylaxis: Lovenox     Code Status: Full Code, confirmed with her daughter at the bedside this afternoon.  Consults called: ER provider has consulted neurology Dr. Quinn Axe, the patient was a code stroke.  Admission status: The appropriate patient status for this patient is INPATIENT. Inpatient status is judged to be reasonable and necessary in order to provide the required intensity of service to ensure the patient's safety. The patient's presenting symptoms, physical exam findings, and initial radiographic and laboratory data in the context of their chronic comorbidities is felt to place them at high risk for further clinical  deterioration. Furthermore, it is not anticipated that the patient will be medically stable for discharge from the hospital within 2 midnights of admission.    I certify that at the point of admission it is my clinical judgment that the patient will require inpatient hospital care spanning beyond 2 midnights from the point of admission due to high intensity of service, high risk for further deterioration and high frequency of surveillance required   Time spent: 48 minutes  Whit Bruni Neva Seat MD [facility] Pager [phone]  If  7PM-7AM, please contact night-coverage [email or web address] Password [facility]  05/08/2022, 3:09 PM

## 2022-05-08 NOTE — Progress Notes (Signed)
Bilateral carotid ultrasound study completed.   Please see CV Procedures for preliminary results.  Javaris Wigington, RVT  3:31 PM 05/08/22

## 2022-05-08 NOTE — ED Notes (Signed)
Report to Johann Capers, Therapist, sports at [facility]

## 2022-05-08 NOTE — Progress Notes (Signed)
[place] 1252 - pt being taken to CT at this time with stroke cart following Dr. Quinn Axe paged [phone] Dr. Quinn Axe on camera 1301 Pt being taken back to room and Dr. Quinn Axe states no further needs from telestroke at this time 1315  R leg weakness, alert to self with dementia at baseline per bedside staff  LNW 2130 3/27 Pt lives with her daughter and was able to walk normally before she went to bed but today has not been able to due to R leg weakness

## 2022-05-08 NOTE — ED Notes (Signed)
Stroke Notification to operator complete.

## 2022-05-08 NOTE — ED Notes (Signed)
[facility] called for pt transport to [facility]

## 2022-05-08 NOTE — Progress Notes (Signed)
Neurology brief note  89 yo patient with dementia brought in for RLE weakness LKW 9:30pm last night. Exam significant for RLE drift and disorientation (latter is baseline). NIHSS = 3. Head CT no acute process on personal review. TNK not administered 2/2 presentation outside the window.   Interim recommendations:  - Admit to [facility] for stroke workup - Permissive HTN x48 hrs from sx onset or until stroke ruled out by MRI goal BP <220/110. PRN labetalol or hydralazine if BP above these parameters. Avoid oral antihypertensives. - MRI brain wo contrast - CTA or MRA H&N - TTE  - Check A1c and LDL + add statin per guidelines - ASA 81mg  daily + plavix 75mg  daily x21 days f/b ASA 81mg  daily monotherapy after that - q4 hr neuro checks - STAT head CT for any change in neuro exam - Tele - PT/OT/SLP - Stroke education - Amb referral to neurology upon discharge   Notify neurology on patient's arrival to [facility], stroke team will see her tomorrow.  Full consult note to follow.  Su Monks, MD [facility] [phone]  If 7pm- 7am, please page neurology on call as listed in [place].

## 2022-05-08 NOTE — ED Triage Notes (Signed)
BIB EMS from home for increased weakness, pt was found this AM on the floor by her family. Pt states she slid to the floor and did not fall, did not have strength to get up. Pt has strong urinary odor per EMS and family concerned for UTI. Hx of dementia.

## 2022-05-08 NOTE — ED Notes (Signed)
Pt back from ct

## 2022-05-08 NOTE — ED Notes (Signed)
Pt is now moving right leg appropriately

## 2022-05-08 NOTE — ED Provider Notes (Signed)
[place] EMERGENCY DEPARTMENT AT [facility] Provider Note   CSN: [id number] Arrival date & time: 05/08/22  1145     History  Chief Complaint  Patient presents with   Weakness    Jeanne Lopez is a 89 y.o. female.  With PMH of HTN, HLD, dementia, osteoporosis, depression, anxiety brought in by daughter from home for concern for altered mental status and weakness.  Patient's daughter notes that they went out of town this weekend to [place] on a trip to visit family.  When they got home she developed a cough and generalized fatigue.  She had been giving her some cough medicine as listed on directions and helping her sleep more however this morning when she found her she was on the ground and confused.  She does not think she hit her head or lost consciousness.  She has been so weak she has been unable to walk.  She has had increased weakness in her legs.  Her last known normal was around 9:30 PM last night when she went to bed.  She has not been on any anticoagulation.  She has had no vomiting or diarrhea.  She had a foul smell of urine on her concerned that maybe she had a UTI.  She has history of a fractured femur however she is still ambulatory with assistance.  Patient's daughter also notes increased confusion and decreased mentation from baseline.  HPI     Home Medications Prior to Admission medications   Medication Sig Start Date End Date Taking? Authorizing Provider  donepezil (ARICEPT) 10 MG tablet Take 1 tablet (10 mg total) by mouth at bedtime. Patient taking differently: Take 5 mg by mouth at bedtime. 07/03/21   Suzzanne Cloud, NP  gabapentin (NEURONTIN) 100 MG capsule TAKE (1) CAPSULE TWICE DAILY. 07/03/21   Suzzanne Cloud, NP  memantine (NAMENDA) 10 MG tablet Take 1 tablet (10 mg total) by mouth 2 (two) times daily. 07/03/21   Suzzanne Cloud, NP  polyethylene glycol [facility] / Floria Raveling) packet Take 17 g by mouth daily as needed for mild constipation.     [provider]  senna (SENOKOT) 8.6 MG TABS tablet Take 1 tablet by mouth.    [provider]  SUMAtriptan (IMITREX) 50 MG tablet Take 1 tablet (50 mg total) by mouth every 2 (two) hours as needed. May repeat in 2 hours if headache persists or recurs. 08/26/21   Suzzanne Cloud, NP  traZODone (DESYREL) 100 MG tablet Take 1 tablet (100 mg total) by mouth at bedtime. 03/24/18   Nita Sells, MD  venlafaxine XR (EFFEXOR-XR) 150 MG 24 hr capsule Take 150 mg by mouth daily. 02/12/18   [provider]      Allergies    Codeine    Review of Systems   Review of Systems  Physical Exam Updated Vital Signs BP (!) 129/48   Pulse (!) 59   Temp 98.1 F (36.7 C)   Resp 19   Ht 5\' 3"  (1.6 m)   Wt 57 kg   SpO2 96%   BMI 22.26 kg/m  Physical Exam Constitutional: Alert and oriented.x person.  GCS 14 for confusion. Eyes: Conjunctivae are normal. ENT      Head: Normocephalic and atraumatic.      Neck: No stridor.  No midline tenderness step-offs or deformity. Cardiovascular: S1, S2,  Normal and symmetric distal pulses are present in all extremities.Warm and well perfused. Respiratory: Normal respiratory effort.  Intermittent wet  cough.  O2 sat 93 on RA Gastrointestinal: Soft and nontender.  Musculoskeletal: No pitting edema of lower extremities Neurologic: Normal speech and language.  No facial droop.  PERRL.  No movement of right lower extremity against gravity.  Slight drift of right upper extremity against gravity.  Full strength of left upper and lower extremity.  Sensation grossly intact.   Skin: Skin is warm, dry and intact. No rash noted. Psychiatric: Mood and affect are normal. Speech and behavior are normal.  ED Results / Procedures / Treatments   Labs (all labs ordered are listed, but only abnormal results are displayed) Labs Reviewed  COMPREHENSIVE METABOLIC PANEL - Abnormal; Notable for the following components:      Result Value   Glucose, Bld 115 (*)     Calcium 8.6 (*)    Total Protein 6.1 (*)    All other components within normal limits  CBC WITH DIFFERENTIAL/PLATELET - Abnormal; Notable for the following components:   WBC 11.6 (*)    Platelets 148 (*)    Neutro Abs 8.9 (*)    Monocytes Absolute 1.2 (*)    All other components within normal limits  BLOOD GAS, VENOUS - Abnormal; Notable for the following components:   pCO2, Ven 42 (*)    All other components within normal limits  LIPID PANEL - Abnormal; Notable for the following components:   LDL Cholesterol 112 (*)    All other components within normal limits  TROPONIN I (HIGH SENSITIVITY) - Abnormal; Notable for the following components:   Troponin I (High Sensitivity) 20 (*)    All other components within normal limits  TROPONIN I (HIGH SENSITIVITY) - Abnormal; Notable for the following components:   Troponin I (High Sensitivity) 21 (*)    All other components within normal limits  RESP PANEL BY RT-PCR (RSV, FLU A&B, COVID)  RVPGX2  MAGNESIUM  TSH  URINALYSIS, ROUTINE W REFLEX MICROSCOPIC  CK  CBC  CREATININE, SERUM  HEMOGLOBIN [id number]  BASIC METABOLIC PANEL  CBC    EKG EKG Interpretation  Date/Time:  Thursday May 08 2022 12:08:14 EDT Ventricular Rate:  60 PR Interval:  162 QRS Duration: 98 QT Interval:  422 QTC Calculation: 422 R Axis:   -60 Text Interpretation: Sinus rhythm Probable left atrial enlargement Left anterior fascicular block Probable anteroseptal infarct, old Borderline repolarization abnormality T wave inversions/ST changes lateral leads Confirmed by Georgina Snell [phone]) on 05/08/2022 12:12:35 PM  Radiology VAS US CAROTID  Result Date: 05/08/2022 Carotid Arterial Duplex Study Patient Name:  Jeanne Lopez  Date of Exam:   05/08/2022 Medical Rec #: [id number]        Accession #:    [id number] Date of Birth: 1932/12/31         Patient Gender: F Patient Age:   76 years Exam Location:  [facility] Procedure:      VAS US CAROTID Referring Phys:  [facility] --------------------------------------------------------------------------------  Indications:  CVA and Weakness. Risk Factors: Hypertension. Limitations   Today's exam was limited due to Patient movement. Performing Technologist: McKayla Maag RVT, [place]  Examination Guidelines: A complete evaluation includes B-mode imaging, spectral Doppler, color Doppler, and power Doppler as needed of all accessible portions of each vessel. Bilateral testing is considered an integral part of a complete examination. Limited examinations for reoccurring indications may be performed as noted.  Right Carotid Findings: +----------+--------+--------+--------+------------------+--------+           PSV cm/sEDV cm/sStenosisPlaque DescriptionComments +----------+--------+--------+--------+------------------+--------+ CCA Prox  82  9                                          +----------+--------+--------+--------+------------------+--------+ CCA Distal65      9                                          +----------+--------+--------+--------+------------------+--------+ ICA Prox  42      7       1-39%                              +----------+--------+--------+--------+------------------+--------+ ICA Mid   104     26                                         +----------+--------+--------+--------+------------------+--------+ ICA Distal70      17                                         +----------+--------+--------+--------+------------------+--------+ ECA       102     2                                          +----------+--------+--------+--------+------------------+--------+ +----------+--------+-------+----------------+-------------------+           PSV cm/sEDV cmsDescribe        Arm Pressure (mmHG) +----------+--------+-------+----------------+-------------------+ Subclavian190            Multiphasic, WNL                     +----------+--------+-------+----------------+-------------------+ +---------+--------+--+--------+--+---------+ VertebralPSV cm/s94EDV cm/s17Antegrade +---------+--------+--+--------+--+---------+  Left Carotid Findings: +----------+--------+--------+--------+------------------+--------+           PSV cm/sEDV cm/sStenosisPlaque DescriptionComments +----------+--------+--------+--------+------------------+--------+ CCA Prox  118     13                                         +----------+--------+--------+--------+------------------+--------+ CCA Distal69      10                                         +----------+--------+--------+--------+------------------+--------+ ICA Prox  67      17      1-39%                              +----------+--------+--------+--------+------------------+--------+ ICA Mid   95      14                                         +----------+--------+--------+--------+------------------+--------+ ICA Distal89      14                                         +----------+--------+--------+--------+------------------+--------+  ECA       98      11                                         +----------+--------+--------+--------+------------------+--------+ +----------+--------+--------+----------------+-------------------+           PSV cm/sEDV cm/sDescribe        Arm Pressure (mmHG) +----------+--------+--------+----------------+-------------------+ Subclavian116             Multiphasic, WNL                    +----------+--------+--------+----------------+-------------------+ +---------+--------+--+--------+--+---------+ VertebralPSV cm/s73EDV cm/s15Antegrade +---------+--------+--+--------+--+---------+   Summary: Right Carotid: Velocities in the right ICA are consistent with a 1-39% stenosis. Left Carotid: Velocities in the left ICA are consistent with a 1-39% stenosis. Vertebrals:  Bilateral vertebral arteries  demonstrate antegrade flow. Subclavians: Normal flow hemodynamics were seen in bilateral subclavian              arteries. *See table(s) above for measurements and observations.  Electronically signed by Antony Contras MD on 05/08/2022 at 5:09:10 PM.    Final    CT HEAD CODE STROKE WO CONTRAST  Result Date: 05/08/2022 CLINICAL DATA:  Code stroke.  Right-sided weakness EXAM: CT HEAD WITHOUT CONTRAST TECHNIQUE: Contiguous axial images were obtained from the base of the skull through the vertex without intravenous contrast. RADIATION DOSE REDUCTION: This exam was performed according to the departmental dose-optimization program which includes automated exposure control, adjustment of the mA and/or kV according to patient size and/or use of iterative reconstruction technique. COMPARISON:  Brain MRI 09/06/2013 FINDINGS: Brain: There is no acute intracranial hemorrhage, extra-axial fluid collection, or acute infarct. Parenchymal volume is within expected limits for age. The ventricles are normal in size. Gray-white differentiation is preserved. Confluent hypodensity in the supratentorial white matter likely reflects sequela of advanced chronic small-vessel ischemic change The pituitary and suprasellar region are normal. There is no mass lesion. There is no mass effect or midline shift. Vascular: No hyperdense vessel is seen. Skull: Normal. Negative for fracture or focal lesion. Sinuses/Orbits: There is layering fluid in the left maxillary and bilateral sphenoid sinuses. Bilateral lens implants are in place. The globes and orbits are otherwise unremarkable. Other: None. ASPECTS [facility] Stroke Program Early CT Score) - Ganglionic level infarction (caudate, lentiform nuclei, internal capsule, insula, M1-M3 cortex): 7 - Supraganglionic infarction (M4-M6 cortex): 3 Total score (0-10 with 10 being normal): 10 IMPRESSION: 1. No acute intracranial pathology. 2. Layering fluid in the left maxillary and sphenoid sinuses can be  seen with acute sinusitis in the correct clinical setting These results were called by telephone at the time of interpretation on 05/08/2022 at 1:08 pm to provider Georgina Snell , who verbally acknowledged these results. Electronically Signed   By: Valetta Mole M.D.   On: 05/08/2022 13:17   DG Chest Portable 1 View  Result Date: 05/08/2022 CLINICAL DATA:  Altered mental status EXAM: PORTABLE CHEST 1 VIEW COMPARISON:  10/05/2019 FINDINGS: Mild cardiomegaly. No focal pulmonary opacity. No pleural effusion or pneumothorax. No acute osseous abnormality. IMPRESSION: No acute cardiopulmonary process. Mild cardiomegaly. Electronically Signed   By: Merilyn Baba M.D.   On: 05/08/2022 12:28    Procedures .Critical Care  Performed by: Elgie Congo, MD Authorized by: Elgie Congo, MD   Critical care provider statement:    Critical care time (minutes):  30   Critical care was  necessary to treat or prevent imminent or life-threatening deterioration of the following conditions:  CNS failure or compromise   Critical care was time spent personally by me on the following activities:  Development of treatment plan with patient or surrogate, discussions with consultants, evaluation of patient's response to treatment, examination of patient, ordering and review of laboratory studies, ordering and review of radiographic studies, ordering and performing treatments and interventions, pulse oximetry, re-evaluation of patient's condition, review of old charts and obtaining history from patient or surrogate   Care discussed with: admitting provider       Medications Ordered in ED Medications  clopidogrel (PLAVIX) tablet 75 mg (has no administration in time range)  memantine (NAMENDA) tablet 10 mg (has no administration in time range)  traZODone (DESYREL) tablet 100 mg (has no administration in time range)  venlafaxine XR (EFFEXOR-XR) 24 hr capsule 150 mg (has no administration in time range)   gabapentin (NEURONTIN) capsule 100 mg (has no administration in time range)  enoxaparin (LOVENOX) injection 40 mg (has no administration in time range)  acetaminophen (TYLENOL) tablet 650 mg (has no administration in time range)    Or  acetaminophen (TYLENOL) suppository 650 mg (has no administration in time range)  docusate sodium (COLACE) capsule 100 mg (has no administration in time range)  polyethylene glycol (MIRALAX / GLYCOLAX) packet 17 g (has no administration in time range)  ondansetron (ZOFRAN) tablet 4 mg (has no administration in time range)    Or  ondansetron (ZOFRAN) injection 4 mg (has no administration in time range)  donepezil (ARICEPT) tablet 10 mg (has no administration in time range)  aspirin EC tablet 81 mg (has no administration in time range)  sodium chloride 0.9 % bolus 1,000 mL (0 mLs Intravenous Stopped 05/08/22 1439)  clopidogrel (PLAVIX) tablet 300 mg (300 mg Oral Given 05/08/22 1440)    ED Course/ Medical Decision Making/ A&P Clinical Course as of 05/08/22 1829  Thu May 08, 2022  1308 Called by code stroke radiologist no acute stroke or hemorrhage on head CT. [VB]    Clinical Course User Index [VB] Elgie Congo, MD   {                             Medical Decision Making Jeanne Lopez is a 89 y.o. female.  With PMH of HTN, HLD, dementia, osteoporosis, depression, anxiety brought in by daughter from home for concern for altered mental status and weakness.    On exam, patient confused and noted to have right-sided weakness.  Last known normal 9:30 PM yesterday.  Out of TNK window however code stroke called.  CT head reviewed by me no ICH.  No evidence of stroke per radiologist.  Was evaluated by Dr. Su Monks who noted patient is out of TNK window and recommended admission to [facility] for further stroke workup as well as starting ASA/Plavix.    Also further evaluate for any causes of delirium in the setting of dementia leading to further altered  mental status.  She did have a mild leukocytosis 11.6 with left shift and mild thrombocytopenia platelet count 148.  VBG without hypercarbia.  Glucose 115.  No other acute electrolyte abnormalities.  Chest x-ray without pneumonia.  Basic infectious workup initiated with RVP and UA as well.  D/w hospitalist plans for admission for CVA workup.  Amount and/or Complexity of Data Reviewed Labs: ordered. Radiology: ordered.  Risk OTC drugs. Prescription drug management. Decision  regarding hospitalization.    Final Clinical Impression(s) / ED Diagnoses Final diagnoses:  Altered mental status, unspecified altered mental status type  Right sided weakness  Viral URI    Rx / DC Orders ED Discharge Orders     None         Elgie Congo, MD 05/08/22 [phone]

## 2022-05-08 NOTE — ED Notes (Signed)
Pt to MRI via stretcher.

## 2022-05-08 NOTE — ED Notes (Signed)
X-ray at bedside

## 2022-05-09 ENCOUNTER — Inpatient Hospital Stay (HOSPITAL_BASED_OUTPATIENT_CLINIC_OR_DEPARTMENT_OTHER): Payer: Medicare PPO

## 2022-05-09 ENCOUNTER — Other Ambulatory Visit (HOSPITAL_COMMUNITY): Payer: Medicare PPO

## 2022-05-09 DIAGNOSIS — I639 Cerebral infarction, unspecified: Secondary | ICD-10-CM | POA: Diagnosis not present

## 2022-05-09 DIAGNOSIS — I6389 Other cerebral infarction: Secondary | ICD-10-CM

## 2022-05-09 LAB — BASIC METABOLIC PANEL
Anion gap: 9 (ref 5–15)
BUN: 13 mg/dL (ref 8–23)
CO2: 22 mmol/L (ref 22–32)
Calcium: 8.2 mg/dL — ABNORMAL LOW (ref 8.9–10.3)
Chloride: 106 mmol/L (ref 98–111)
Creatinine, Ser: 0.93 mg/dL (ref 0.44–1.00)
GFR, Estimated: 59 mL/min — ABNORMAL LOW (ref >60)
Glucose, Bld: 95 mg/dL (ref 70–99)
Potassium: 3.2 mmol/L — ABNORMAL LOW (ref 3.5–5.1)
Sodium: 137 mmol/L (ref 135–145)

## 2022-05-09 LAB — CBC
HCT: 35.8 % — ABNORMAL LOW (ref 36.0–46.0)
Hemoglobin: 11.9 g/dL — ABNORMAL LOW (ref 12.0–15.0)
MCH: 31.7 pg (ref 26.0–34.0)
MCHC: 33.2 g/dL (ref 30.0–36.0)
MCV: 95.5 fL (ref 80.0–100.0)
Platelets: 129 10*3/uL — ABNORMAL LOW (ref 150–400)
RBC: 3.75 MIL/uL — ABNORMAL LOW (ref 3.87–5.11)
RDW: 14.3 % (ref 11.5–15.5)
WBC: 10.2 10*3/uL (ref 4.0–10.5)
nRBC: 0 % (ref 0.0–0.2)

## 2022-05-09 LAB — ECHOCARDIOGRAM COMPLETE
Height: 63 in
Weight: 2010.6 oz

## 2022-05-09 LAB — HEMOGLOBIN A1C
Hgb A1c MFr Bld: 5.5 % (ref 4.8–5.6)
Mean Plasma Glucose: 111 mg/dL

## 2022-05-09 MED ORDER — ASPIRIN 81 MG PO TBEC: 81.0000 mg | DELAYED_RELEASE_TABLET | Freq: Every day | ORAL | 12 refills | Status: DC

## 2022-05-09 MED ORDER — POTASSIUM CHLORIDE CRYS ER 20 MEQ PO TBCR
40.0000 meq | EXTENDED_RELEASE_TABLET | Freq: Once | ORAL | Status: AC
  Administered 2022-05-09: 40 meq via ORAL
  Filled 2022-05-09: qty 2

## 2022-05-09 MED ORDER — ACETAMINOPHEN 325 MG PO TABS: 650.0000 mg | ORAL_TABLET | Freq: Four times a day (QID) | ORAL | Status: AC | PRN

## 2022-05-09 MED ORDER — GUAIFENESIN-DM 100-10 MG/5ML PO SYRP: 10.0000 mL | ORAL_SOLUTION | Freq: Four times a day (QID) | ORAL | 0 refills | Status: DC | PRN

## 2022-05-09 MED ORDER — AMOXICILLIN-POT CLAVULANATE 875-125 MG PO TABS: 1.0000 | ORAL_TABLET | Freq: Two times a day (BID) | ORAL | 0 refills | Status: AC

## 2022-05-09 MED ORDER — ATORVASTATIN CALCIUM 40 MG PO TABS: 40.0000 mg | ORAL_TABLET | Freq: Every day | ORAL | 11 refills | Status: DC

## 2022-05-09 MED ORDER — AMOXICILLIN-POT CLAVULANATE 875-125 MG PO TABS
1.0000 | ORAL_TABLET | Freq: Two times a day (BID) | ORAL | Status: DC
  Administered 2022-05-09: 1 via ORAL
  Filled 2022-05-09: qty 1

## 2022-05-09 MED ORDER — CLOPIDOGREL BISULFATE 75 MG PO TABS: 75.0000 mg | ORAL_TABLET | Freq: Every day | ORAL | 0 refills | Status: AC

## 2022-05-09 MED ORDER — GUAIFENESIN-DM 100-10 MG/5ML PO SYRP: 10.0000 mL | ORAL_SOLUTION | Freq: Four times a day (QID) | ORAL | Status: DC | PRN

## 2022-05-09 NOTE — Discharge Summary (Signed)
Physician Discharge Summary  VANESHA BURDON [id number] DOB: June 09, 1932 DOA: 05/08/2022  PCP: Carol Ada, MD  Admit date: 05/08/2022 Discharge date: 05/09/2022  Admitted From: Home Disposition: Home with home health PT/OT  Recommendations for Outpatient Follow-up:  Follow up with PCP in 1-2 weeks   Home Health: PT/OT Equipment/Devices: Available at home  Discharge Condition: Fair CODE STATUS: Full code Diet recommendation: Low-salt diet  Discharge summary: 89 year old with history of anxiety depression and dementia lives at home with her daughter who found her on the floor of her bedroom with a global weakness and slight confusion.  Patient recently had traveled in a train, she came back quite tired and cough but no fever.  She was given cough medication at night.  She was brought to the ER.  Hemodynamically stable.  Subjective right-sided weakness, code stroke was called.  She was not candidate for tPA due to unknown duration.  She was globally weak.  Chest x-ray was normal.  RSV, influenza and COVID test were negative.  Seen by teleneurology.  Admitted to the hospital for stroke/TIA workup.  Patient did good clinical recovery.  Gradually normalizing now remains globally weak.  TIA versus viral syndrome: Clinical findings, global weakness.  Possible right-sided weakness.  Currently resolved. CT head findings, no acute findings.  Layering fluid in the left maxillary and sphenoid sinuses. MRI of the brain, motion degraded exam.  No evidence of acute infarct.  Advanced chronic small vessel ischemic changes.  Mild to moderate generalized parenchymal atrophy.  Paranasal disease as prescribed. Carotid Doppler, normal flow hemodynamics, no clinically significant stenosis. Transcranial Doppler, results pending. 2D echocardiogram, normal ejection fraction.  No source of intracardiac thrombus.  Grade 1 diastolic dysfunction. Antiplatelet therapy, none at home.  Neurology recommended aspirin  and Plavix for 3 weeks then aspirin alone. LDL 112, will start Lipitor 40 mg daily. Hemoglobin A1c, 5.5. Therapy recommendations, PT/OT at home.  Viral syndrome/bronchitis:  Also with evidence of sinusitis, patient without any symptoms.  Probably causing above presentations.  Chest x-ray was clear.  She has a wet cough.  Will prescribe 5 days of Augmentin, chest physiotherapy, incentive spirometry.  She will use Mucinex and over-the-counter cough medications.  Chronic medical issues including Dementia Anxiety depression Essential hypertension Hyperlipidemia as above.   Medically stable.  Seen by PT OT.  Lives at home with her daughter.  Able to go home with therapies at home.     Discharge Diagnoses:  Principal Problem:   Acute ischemic stroke [facility]) Active Problems:   Dementia arising in the senium and presenium ([place])   Depression with anxiety   Memory disorder   Chronic kidney disease due to hypertension   Benign essential hypertension   Dyslipidemia    Discharge Instructions  Discharge Instructions     Diet - low sodium heart healthy   Complete by: As directed    Increase activity slowly   Complete by: As directed    Increase activity slowly   Complete by: As directed       Allergies as of 05/09/2022       Reactions   Codeine Nausea And Vomiting        Medication List     TAKE these medications    acetaminophen 325 MG tablet Commonly known as: TYLENOL Take 2 tablets (650 mg total) by mouth every 6 (six) hours as needed for mild pain (or Fever >/= 101).   amoxicillin-clavulanate 875-125 MG tablet Commonly known as: AUGMENTIN Take 1 tablet by mouth every  12 (twelve) hours for 5 days.   aspirin EC 81 MG tablet Take 1 tablet (81 mg total) by mouth daily. Swallow whole. Start taking on: May 10, 2022   atorvastatin 40 MG tablet Commonly known as: Lipitor Take 1 tablet (40 mg total) by mouth daily.   clopidogrel 75 MG tablet Commonly known as:  PLAVIX Take 1 tablet (75 mg total) by mouth daily for 21 days. Start taking on: May 10, 2022   docusate sodium 100 MG capsule Commonly known as: COLACE Take 200 mg by mouth daily.   donepezil 10 MG tablet Commonly known as: ARICEPT Take 1 tablet (10 mg total) by mouth at bedtime. What changed: how much to take   ferrous sulfate 325 (65 FE) MG tablet Take 325 mg by mouth daily with breakfast.   gabapentin 100 MG capsule Commonly known as: NEURONTIN TAKE (1) CAPSULE TWICE DAILY.   guaiFENesin-dextromethorphan 100-10 MG/5ML syrup Commonly known as: ROBITUSSIN DM Take 10 mLs by mouth every 6 (six) hours as needed for cough.   memantine 10 MG tablet Commonly known as: NAMENDA Take 1 tablet (10 mg total) by mouth 2 (two) times daily.   multivitamin with minerals Tabs tablet Take 1 tablet by mouth daily.   polyethylene glycol 17 g packet Commonly known as: MIRALAX / GLYCOLAX Take 17 g by mouth daily as needed for mild constipation.   SUMAtriptan 50 MG tablet Commonly known as: IMITREX Take 1 tablet (50 mg total) by mouth every 2 (two) hours as needed. May repeat in 2 hours if headache persists or recurs.   traZODone 100 MG tablet Commonly known as: DESYREL Take 1 tablet (100 mg total) by mouth at bedtime.   venlafaxine XR 150 MG 24 hr capsule Commonly known as: EFFEXOR-XR Take 150 mg by mouth daily.   VITAMIN C PO Take 1 tablet by mouth daily.   VITAMIN D PO Take 1 tablet by mouth daily.               Durable Medical Equipment  (From admission, onward)           Start     Ordered   05/09/22 1454  For home use only DME Walker rolling  Once       Question Answer Comment  Walker: With 5 Inch Wheels   Patient needs a walker to treat with the following condition Weakness      05/09/22 1453            Follow-up Information     [facility] Follow up.   Specialty: Home Health Services Why: The home health agency will contact you for  the first home visit. Contact information: [address] [place] [address] [phone]                Allergies  Allergen Reactions   Codeine Nausea And Vomiting    Consultations: Neurology   Procedures/Studies: VAS [place] TRANSCRANIAL DOPPLER  Result Date: 05/09/2022  Transcranial Doppler Patient Name:  RASHELLE GESCHKE  Date of Exam:   05/09/2022 Medical Rec #: [id number]        Accession #:    [id number] Date of Birth: 1932/07/05         Patient Gender: F Patient Age:   35 years Exam Location:  [facility] Procedure:      VAS [place] TRANSCRANIAL DOPPLER Referring Phys: PRAMOD SETHI --------------------------------------------------------------------------------  Indications: Stroke work-up. Limitations for diagnostic windows: Unable to insonate left transtemporal  window. Comparison Study: No prior studies. Performing Technologist: Darlin Coco RDMS, RVT  Examination Guidelines: A complete evaluation includes B-mode imaging, spectral Doppler, color Doppler, and power Doppler as needed of all accessible portions of each vessel. Bilateral testing is considered an integral part of a complete examination. Limited examinations for reoccurring indications may be performed as noted.  +----------+---------------+----------+-----------+-------+ RIGHT TCD Right VM (cm/s)Depth (cm)PulsatilityComment +----------+---------------+----------+-----------+-------+ MCA            47.00                  1.86            +----------+---------------+----------+-----------+-------+ ACA           -35.00                  1.62            +----------+---------------+----------+-----------+-------+ Term ICA       37.00                  1.67            +----------+---------------+----------+-----------+-------+ PCA P1         33.00                  1.45            +----------+---------------+----------+-----------+-------+ Opthalmic      23.00                  1.48             +----------+---------------+----------+-----------+-------+ ICA siphon     40.00                  1.83            +----------+---------------+----------+-----------+-------+ Vertebral     -21.00                  1.44            +----------+---------------+----------+-----------+-------+ Distal ICA     16.00                  0.85            +----------+---------------+----------+-----------+-------+  +----------+--------------+----------+-----------+------------------+ LEFT TCD  Left VM (cm/s)Depth (cm)Pulsatility     Comment       +----------+--------------+----------+-----------+------------------+ MCA                                          Unable to insonate +----------+--------------+----------+-----------+------------------+ ACA                                          Unable to insonate +----------+--------------+----------+-----------+------------------+ Term ICA                                     Unable to insonate +----------+--------------+----------+-----------+------------------+ PCA P1                                       Unable to insonate +----------+--------------+----------+-----------+------------------+ Opthalmic     26.00  1.70                       +----------+--------------+----------+-----------+------------------+ ICA siphon    38.00                  1.68                       +----------+--------------+----------+-----------+------------------+ Vertebral     -21.00                 1.63                       +----------+--------------+----------+-----------+------------------+  +------------+-------+-------+             VM cm/sComment +------------+-------+-------+ Prox Basilar-23.00         +------------+-------+-------+ Dist Basilar-21.00         +------------+-------+-------+    Preliminary    ECHOCARDIOGRAM COMPLETE  Result Date: 05/09/2022    ECHOCARDIOGRAM REPORT   Patient Name:    DAPHNEY SANZARI Date of Exam: 05/09/2022 Medical Rec #:  [id number]       Height: Accession #:    [id number]      Weight: Date of Birth:  Feb 04, 1933        BSA: Patient Age:    42 years        BP:           117/46 mmHg Patient Gender: F               HR:           66 bpm. Exam Location:  Inpatient Procedure: 2D Echo, Cardiac Doppler and Color Doppler Indications:    Stroke  History:        Patient has prior history of Echocardiogram examinations, most                 recent 10/03/2021. Stroke; Risk Factors:Hypertension and                 Dyslipidemia.  Sonographer:    Marella Chimes Referring Phys: [id number] [place]  1. Challenging windows. Left ventricular ejection fraction, by estimation, is 60 to 65%. The left ventricle has normal function. The left ventricle has no regional wall motion abnormalities. There is moderate left ventricular hypertrophy. Left ventricular diastolic parameters are consistent with Grade I diastolic dysfunction (impaired relaxation).  2. Right ventricular systolic function is normal. The right ventricular size is normal. Tricuspid regurgitation signal is inadequate for assessing PA pressure.  3. Left atrial size was moderately dilated.  4. Moderate pericardial effusion. The pericardial effusion is posterior to the left ventricle. There is no evidence of cardiac tamponade.  5. No evidence of mitral valve regurgitation.  6. The aortic valve is calcified. Aortic valve regurgitation is mild. Mild to moderate aortic valve stenosis.  7. The inferior vena cava is normal in size with <50% respiratory variability, suggesting right atrial pressure of 8 mmHg. FINDINGS  Left Ventricle: Challenging windows. Left ventricular ejection fraction, by estimation, is 60 to 65%. The left ventricle has normal function. The left ventricle has no regional wall motion abnormalities. The left ventricular internal cavity size was normal in size. There is moderate left ventricular hypertrophy. Left  ventricular diastolic parameters are consistent with Grade I diastolic dysfunction (impaired relaxation). Right Ventricle: The right ventricular size is normal. Right ventricular systolic function is normal. Tricuspid regurgitation signal is inadequate for assessing PA pressure. Left  Atrium: Left atrial size was moderately dilated. Right Atrium: Right atrial size was normal in size. Pericardium: A moderately sized pericardial effusion is present. The pericardial effusion is posterior to the left ventricle. There is no evidence of cardiac tamponade. Mitral Valve: No evidence of mitral valve regurgitation. Tricuspid Valve: Tricuspid valve regurgitation is not demonstrated. Aortic Valve: The aortic valve is calcified. Aortic valve regurgitation is mild. Mild to moderate aortic stenosis is present. Aorta: The aortic root and ascending aorta are structurally normal, with no evidence of dilitation. Venous: The inferior vena cava is normal in size with less than 50% respiratory variability, suggesting right atrial pressure of 8 mmHg. IAS/Shunts: No atrial level shunt detected by color flow Doppler. Phineas Inches Electronically signed by Phineas Inches Signature Date/Time: 05/09/2022/10:26:20 AM    Final    MR BRAIN WO CONTRAST  Result Date: 05/08/2022 CLINICAL DATA:  Provided history: Mental status change, persistent or worsening. Increased weakness. Patient found down. EXAM: MRI HEAD WITHOUT CONTRAST TECHNIQUE: Multiplanar, multiecho pulse sequences of the brain and surrounding structures were obtained without intravenous contrast. COMPARISON:  Head CT 05/08/2022. Brain MRI 09/06/2013. FINDINGS: Intermittently motion degraded examination, limiting evaluation. Most notably, there is severe motion degradation of the sagittal T1-weighted sequence, moderate/severe motion degradation of the axial T2 sequence and moderate/severe motion degradation of the axial T2 FLAIR sequence. Brain: Mild-to-moderate generalized parenchymal  atrophy. Moderately advanced multifocal T2 FLAIR hyperintense signal abnormality within the cerebral white matter, nonspecific but compatible with chronic small vessel ischemic disease. There are a few punctate chronic microhemorrhages scattered within the supratentorial brain. The diffusion-weighted imaging is of good quality and there is no evidence of an acute infarct. Within the limitations of motion degradation, no intracranial mass or extra-axial fluid collection is identified. No midline shift. Vascular: Maintained flow voids within the proximal large arterial vessels. Skull and upper cervical spine: Within the limitations of motion degradation, no focal suspicious marrow lesion is identified. Sinuses/Orbits: No mass or acute finding within the imaged orbits. Prior bilateral ocular lens replacement. Mild mucosal thickening within the right maxillary sinus. Fluid level, and mild background mucosal thickening, within the left maxillary sinus. Mild mucosal thickening within the right sphenoid sinus. Extensive T2 hyperintense opacification of the left sphenoid sinus. Mild mucosal thickening within bilateral ethmoid air cells. IMPRESSION: 1. Significantly motion degraded examination, limiting evaluation. 2. The diffusion-weighted imaging is of good quality and there is no evidence of an acute infarct. 3. Moderately advanced chronic small vessel ischemic changes within the cerebral white matter. 4. Mild-to-moderate generalized parenchymal atrophy. 5. Paranasal sinus disease, as described. Electronically Signed   By: Kellie Simmering D.O.   On: 05/08/2022 18:30   VAS US CAROTID  Result Date: 05/08/2022 Carotid Arterial Duplex Study Patient Name:  ANYELA MACDOUGALL  Date of Exam:   05/08/2022 Medical Rec #: [id number]        Accession #:    [id number] Date of Birth: October 08, 1932         Patient Gender: F Patient Age:   34 years Exam Location:  [facility] Procedure:      VAS US CAROTID Referring Phys: M[facility]  --------------------------------------------------------------------------------  Indications:  CVA and Weakness. Risk Factors: Hypertension. Limitations   Today's exam was limited due to Patient movement. Performing Technologist: McKayla Maag RVT, [place]  Examination Guidelines: A complete evaluation includes B-mode imaging, spectral Doppler, color Doppler, and power Doppler as needed of all accessible portions of each vessel. Bilateral testing is considered an integral part of a  complete examination. Limited examinations for reoccurring indications may be performed as noted.  Right Carotid Findings: +----------+--------+--------+--------+------------------+--------+           PSV cm/sEDV cm/sStenosisPlaque DescriptionComments +----------+--------+--------+--------+------------------+--------+ CCA Prox  82      9                                          +----------+--------+--------+--------+------------------+--------+ CCA Distal65      9                                          +----------+--------+--------+--------+------------------+--------+ ICA Prox  42      7       1-39%                              +----------+--------+--------+--------+------------------+--------+ ICA Mid   104     26                                         +----------+--------+--------+--------+------------------+--------+ ICA Distal70      17                                         +----------+--------+--------+--------+------------------+--------+ ECA       102     2                                          +----------+--------+--------+--------+------------------+--------+ +----------+--------+-------+----------------+-------------------+           PSV cm/sEDV cmsDescribe        Arm Pressure (mmHG) +----------+--------+-------+----------------+-------------------+ Subclavian190            Multiphasic, WNL                     +----------+--------+-------+----------------+-------------------+ +---------+--------+--+--------+--+---------+ VertebralPSV cm/s94EDV cm/s17Antegrade +---------+--------+--+--------+--+---------+  Left Carotid Findings: +----------+--------+--------+--------+------------------+--------+           PSV cm/sEDV cm/sStenosisPlaque DescriptionComments +----------+--------+--------+--------+------------------+--------+ CCA Prox  118     13                                         +----------+--------+--------+--------+------------------+--------+ CCA Distal69      10                                         +----------+--------+--------+--------+------------------+--------+ ICA Prox  67      17      1-39%                              +----------+--------+--------+--------+------------------+--------+ ICA Mid   95      14                                         +----------+--------+--------+--------+------------------+--------+  ICA Distal89      14                                         +----------+--------+--------+--------+------------------+--------+ ECA       98      11                                         +----------+--------+--------+--------+------------------+--------+ +----------+--------+--------+----------------+-------------------+           PSV cm/sEDV cm/sDescribe        Arm Pressure (mmHG) +----------+--------+--------+----------------+-------------------+ Subclavian116             Multiphasic, WNL                    +----------+--------+--------+----------------+-------------------+ +---------+--------+--+--------+--+---------+ VertebralPSV cm/s73EDV cm/s15Antegrade +---------+--------+--+--------+--+---------+   Summary: Right Carotid: Velocities in the right ICA are consistent with a 1-39% stenosis. Left Carotid: Velocities in the left ICA are consistent with a 1-39% stenosis. Vertebrals:  Bilateral vertebral arteries  demonstrate antegrade flow. Subclavians: Normal flow hemodynamics were seen in bilateral subclavian              arteries. *See table(s) above for measurements and observations.  Electronically signed by Antony Contras MD on 05/08/2022 at 5:09:10 PM.    Final    CT HEAD CODE STROKE WO CONTRAST  Result Date: 05/08/2022 CLINICAL DATA:  Code stroke.  Right-sided weakness EXAM: CT HEAD WITHOUT CONTRAST TECHNIQUE: Contiguous axial images were obtained from the base of the skull through the vertex without intravenous contrast. RADIATION DOSE REDUCTION: This exam was performed according to the departmental dose-optimization program which includes automated exposure control, adjustment of the mA and/or kV according to patient size and/or use of iterative reconstruction technique. COMPARISON:  Brain MRI 09/06/2013 FINDINGS: Brain: There is no acute intracranial hemorrhage, extra-axial fluid collection, or acute infarct. Parenchymal volume is within expected limits for age. The ventricles are normal in size. Gray-white differentiation is preserved. Confluent hypodensity in the supratentorial white matter likely reflects sequela of advanced chronic small-vessel ischemic change The pituitary and suprasellar region are normal. There is no mass lesion. There is no mass effect or midline shift. Vascular: No hyperdense vessel is seen. Skull: Normal. Negative for fracture or focal lesion. Sinuses/Orbits: There is layering fluid in the left maxillary and bilateral sphenoid sinuses. Bilateral lens implants are in place. The globes and orbits are otherwise unremarkable. Other: None. ASPECTS [facility] Stroke Program Early CT Score) - Ganglionic level infarction (caudate, lentiform nuclei, internal capsule, insula, M1-M3 cortex): 7 - Supraganglionic infarction (M4-M6 cortex): 3 Total score (0-10 with 10 being normal): 10 IMPRESSION: 1. No acute intracranial pathology. 2. Layering fluid in the left maxillary and sphenoid sinuses can be  seen with acute sinusitis in the correct clinical setting These results were called by telephone at the time of interpretation on 05/08/2022 at 1:08 pm to provider Georgina Snell , who verbally acknowledged these results. Electronically Signed   By: Valetta Mole M.D.   On: 05/08/2022 13:17   DG Chest Portable 1 View  Result Date: 05/08/2022 CLINICAL DATA:  Altered mental status EXAM: PORTABLE CHEST 1 VIEW COMPARISON:  10/05/2019 FINDINGS: Mild cardiomegaly. No focal pulmonary opacity. No pleural effusion or pneumothorax. No acute osseous abnormality. IMPRESSION: No acute cardiopulmonary process. Mild cardiomegaly. Electronically Signed  By: Merilyn Baba M.D.   On: 05/08/2022 12:28   (Echo, Carotid, EGD, Colonoscopy, ERCP)    Subjective: Seen and examined in the morning rounds.  Daughter at the bedside.  Detailed discussion done.  Results discussed.  Discussed with neurology for discharge recommendations. Patient does have significant wet cough.   Discharge Exam: Vitals:   05/09/22 1219 05/09/22 1335  BP: (!) 97/41 (!) 122/109  Pulse: (!) 53   Resp: 17 18  Temp: 98.8 F (37.1 C)   SpO2: 94%    Vitals:   05/09/22 0404 05/09/22 0855 05/09/22 1219 05/09/22 1335  BP: (!) 117/46 (!) 114/44 (!) 97/41 (!) 122/109  Pulse: (!) 46 68 (!) 53   Resp: 17 15 17 18   Temp:  99.8 F (37.7 C) 98.8 F (37.1 C)   TempSrc:  Oral Oral   SpO2: 95% 95% 94%   Weight:      Height:        General: Pt is alert, awake, not in acute distress Alert awake.  She is oriented to herself and family.  Not oriented to time or place.  Pleasant to conversation. Without any obvious neurological deficit.  Global weakness. Cardiovascular: RRR, S1/S2 +, no rubs, no gallops Respiratory: CTA bilaterally, he does have some upper airway conducted sounds. Abdominal: Soft, NT, ND, bowel sounds + Extremities: no edema, no cyanosis    The results of significant diagnostics from this hospitalization (including imaging,  microbiology, ancillary and laboratory) are listed below for reference.     Microbiology: Recent Results (from the past 240 hour(s))  Resp panel by RT-PCR (RSV, Flu A&B, Covid) Anterior Nasal Swab     Status: None   Collection Time: 05/08/22 12:41 PM   Specimen: Anterior Nasal Swab  Result Value Ref Range Status   SARS Coronavirus 2 by RT PCR NEGATIVE NEGATIVE Final    Comment: (NOTE) SARS-CoV-2 target nucleic acids are NOT DETECTED.  The SARS-CoV-2 RNA is generally detectable in upper respiratory specimens during the acute phase of infection. The lowest concentration of SARS-CoV-2 viral copies this assay can detect is 138 copies/mL. A negative result does not preclude SARS-Cov-2 infection and should not be used as the sole basis for treatment or other patient management decisions. A negative result may occur with  improper specimen collection/handling, submission of specimen other than nasopharyngeal swab, presence of viral mutation(s) within the areas targeted by this assay, and inadequate number of viral copies(<138 copies/mL). A negative result must be combined with clinical observations, patient history, and epidemiological information. The expected result is Negative.  Fact Sheet for Patients:  EntrepreneurPulse.com.au  Fact Sheet for Healthcare Providers:  IncredibleEmployment.be  This test is no t yet approved or cleared by the [place] FDA and  has been authorized for detection and/or diagnosis of SARS-CoV-2 by FDA under an Emergency Use Authorization (EUA). This EUA will remain  in effect (meaning this test can be used) for the duration of the COVID-19 declaration under Section 564(b)(1) of the Act, 21 U.S.C.section 360bbb-3(b)(1), unless the authorization is terminated  or revoked sooner.       Influenza A by PCR NEGATIVE NEGATIVE Final   Influenza B by PCR NEGATIVE NEGATIVE Final    Comment: (NOTE) The Xpert Xpress  SARS-CoV-2/FLU/RSV plus assay is intended as an aid in the diagnosis of influenza from Nasopharyngeal swab specimens and should not be used as a sole basis for treatment. Nasal washings and aspirates are unacceptable for Xpert Xpress SARS-CoV-2/FLU/RSV testing.  Fact Sheet for  Patients: EntrepreneurPulse.com.au  Fact Sheet for Healthcare Providers: IncredibleEmployment.be  This test is not yet approved or cleared by the [place] FDA and has been authorized for detection and/or diagnosis of SARS-CoV-2 by FDA under an Emergency Use Authorization (EUA). This EUA will remain in effect (meaning this test can be used) for the duration of the COVID-19 declaration under Section 564(b)(1) of the Act, 21 U.S.C. section 360bbb-3(b)(1), unless the authorization is terminated or revoked.     Resp Syncytial Virus by PCR NEGATIVE NEGATIVE Final    Comment: (NOTE) Fact Sheet for Patients: EntrepreneurPulse.com.au  Fact Sheet for Healthcare Providers: IncredibleEmployment.be  This test is not yet approved or cleared by the [place] FDA and has been authorized for detection and/or diagnosis of SARS-CoV-2 by FDA under an Emergency Use Authorization (EUA). This EUA will remain in effect (meaning this test can be used) for the duration of the COVID-19 declaration under Section 564(b)(1) of the Act, 21 U.S.C. section 360bbb-3(b)(1), unless the authorization is terminated or revoked.  Performed at [facility], [place] [address] [place],  [address]      Labs: BNP (last 3 results) No results for input(s): "BNP" in the last 8760 hours. Basic Metabolic Panel: Recent Labs  Lab 05/08/22 1214 05/08/22 1901 05/09/22 0735  NA 137  --  137  K 3.8  --  3.2*  CL 104  --  106  CO2 25  --  22  GLUCOSE 115*  --  95  BUN 13  --  13  CREATININE 0.84 0.96 0.93  CALCIUM 8.6*  --  8.2*  MG 2.0  --    --    Liver Function Tests: Recent Labs  Lab 05/08/22 1214  AST 27  ALT 17  ALKPHOS 61  BILITOT 0.9  PROT 6.1*  ALBUMIN 3.5   No results for input(s): "LIPASE", "AMYLASE" in the last 168 hours. No results for input(s): "AMMONIA" in the last 168 hours. CBC: Recent Labs  Lab 05/08/22 1214 05/08/22 1901 05/09/22 0735  WBC 11.6* 10.6* 10.2  NEUTROABS 8.9*  --   --   HGB 13.4 12.0 11.9*  HCT 41.2 36.5 35.8*  MCV 96.9 95.8 95.5  PLT 148* 141* 129*   Cardiac Enzymes: Recent Labs  Lab 05/08/22 1214  CKTOTAL 118   BNP: Invalid input(s): "POCBNP" CBG: No results for input(s): "GLUCAP" in the last 168 hours. D-Dimer No results for input(s): "DDIMER" in the last 72 hours. Hgb A1c Recent Labs    05/08/22 1901  HGBA1C 5.5   Lipid Profile Recent Labs    05/08/22 1450  CHOL 184  HDL 60  LDLCALC 112*  TRIG 58  CHOLHDL 3.1   Thyroid function studies Recent Labs    05/08/22 1241  TSH 1.290   Anemia work up No results for input(s): "VITAMINB12", "FOLATE", "FERRITIN", "TIBC", "IRON", "RETICCTPCT" in the last 72 hours. Urinalysis    Component Value Date/Time   COLORURINE YELLOW 05/08/2022 1345   APPEARANCEUR CLEAR 05/08/2022 1345   LABSPEC 1.011 05/08/2022 1345   PHURINE 6.0 05/08/2022 1345   GLUCOSEU NEGATIVE 05/08/2022 1345   HGBUR NEGATIVE 05/08/2022 1345   BILIRUBINUR NEGATIVE 05/08/2022 1345   KETONESUR NEGATIVE 05/08/2022 1345   PROTEINUR NEGATIVE 05/08/2022 1345   UROBILINOGEN 0.2 09/13/2008 0852   NITRITE NEGATIVE 05/08/2022 1345   LEUKOCYTESUR NEGATIVE 05/08/2022 1345   Sepsis Labs Recent Labs  Lab 05/08/22 1214 05/08/22 1901 05/09/22 0735  WBC 11.6* 10.6* 10.2   Microbiology Recent Results (from  the past 240 hour(s))  Resp panel by RT-PCR (RSV, Flu A&B, Covid) Anterior Nasal Swab     Status: None   Collection Time: 05/08/22 12:41 PM   Specimen: Anterior Nasal Swab  Result Value Ref Range Status   SARS Coronavirus 2 by RT PCR NEGATIVE  NEGATIVE Final    Comment: (NOTE) SARS-CoV-2 target nucleic acids are NOT DETECTED.  The SARS-CoV-2 RNA is generally detectable in upper respiratory specimens during the acute phase of infection. The lowest concentration of SARS-CoV-2 viral copies this assay can detect is 138 copies/mL. A negative result does not preclude SARS-Cov-2 infection and should not be used as the sole basis for treatment or other patient management decisions. A negative result may occur with  improper specimen collection/handling, submission of specimen other than nasopharyngeal swab, presence of viral mutation(s) within the areas targeted by this assay, and inadequate number of viral copies(<138 copies/mL). A negative result must be combined with clinical observations, patient history, and epidemiological information. The expected result is Negative.  Fact Sheet for Patients:  EntrepreneurPulse.com.au  Fact Sheet for Healthcare Providers:  IncredibleEmployment.be  This test is no t yet approved or cleared by the [place] FDA and  has been authorized for detection and/or diagnosis of SARS-CoV-2 by FDA under an Emergency Use Authorization (EUA). This EUA will remain  in effect (meaning this test can be used) for the duration of the COVID-19 declaration under Section 564(b)(1) of the Act, 21 U.S.C.section 360bbb-3(b)(1), unless the authorization is terminated  or revoked sooner.       Influenza A by PCR NEGATIVE NEGATIVE Final   Influenza B by PCR NEGATIVE NEGATIVE Final    Comment: (NOTE) The Xpert Xpress SARS-CoV-2/FLU/RSV plus assay is intended as an aid in the diagnosis of influenza from Nasopharyngeal swab specimens and should not be used as a sole basis for treatment. Nasal washings and aspirates are unacceptable for Xpert Xpress SARS-CoV-2/FLU/RSV testing.  Fact Sheet for Patients: EntrepreneurPulse.com.au  Fact Sheet for Healthcare  Providers: IncredibleEmployment.be  This test is not yet approved or cleared by the [place] FDA and has been authorized for detection and/or diagnosis of SARS-CoV-2 by FDA under an Emergency Use Authorization (EUA). This EUA will remain in effect (meaning this test can be used) for the duration of the COVID-19 declaration under Section 564(b)(1) of the Act, 21 U.S.C. section 360bbb-3(b)(1), unless the authorization is terminated or revoked.     Resp Syncytial Virus by PCR NEGATIVE NEGATIVE Final    Comment: (NOTE) Fact Sheet for Patients: EntrepreneurPulse.com.au  Fact Sheet for Healthcare Providers: IncredibleEmployment.be  This test is not yet approved or cleared by the [place] FDA and has been authorized for detection and/or diagnosis of SARS-CoV-2 by FDA under an Emergency Use Authorization (EUA). This EUA will remain in effect (meaning this test can be used) for the duration of the COVID-19 declaration under Section 564(b)(1) of the Act, 21 U.S.C. section 360bbb-3(b)(1), unless the authorization is terminated or revoked.  Performed at [facility], [place] [address], [place], [place] [address]      Time coordinating discharge: 32 minutes  SIGNED:   Barb Merino, MD  [facility] 05/09/2022, 3:00 PM

## 2022-05-09 NOTE — Progress Notes (Signed)
AVS printed, Education provided. All questions answered.   Pt A/O x1-2 at the baseline. No neuro changes throughout the shift. IV removed and intact. Pt DC home with family.

## 2022-05-09 NOTE — Progress Notes (Signed)
Transcranial doppler study completed.   Please see CV Proc for preliminary results.   Darlin Coco, RDMS, RVT

## 2022-05-09 NOTE — Progress Notes (Signed)
  Echocardiogram 2D Echocardiogram has been performed.  Jeanne Lopez [name] 05/09/2022, 9:27 AM

## 2022-05-09 NOTE — Evaluation (Signed)
Physical Therapy Evaluation Patient Details Name: Jeanne Lopez MRN: [id number] DOB: 02/18/1932 Today's Date: 05/09/2022  History of Present Illness  Pt is 89 yo F who presented 05/08/22 to ED after she was found on the ground, global weakness and confusion. No evidence of acute infarct on MRI. Being worked up for TIA vs viral syndrome. PMH includes anxiety, depression, dementia.   Clinical Impression  Pt presents with condition above and deficits mentioned below, see PT Problem List. PTA, she was independent without an AD for functional mobility, living with her daughter and other relatives in a split-level house with a ramped entrance. Her bedroom is up a few steps but she can stay on the couch on the main level if needed. Currently, pt displays deficits in cognition (hx of dementia, but currently more confused per pt's daughter), gross strength, balance, and activity tolerance. Pt has difficulty initiating movements and stutters when trying to step from a line of one colored tiles to another line of different colored tiles. Pt is currently requiring minA-modA for bed mobility and minA for transfers and to ambulate with a RW, needing assistance to guide the RW at times. Pt's daughter confirmed pt has the level of care she needs at home. Pt would likely due better in her home due to it being a familiar environment considering her dementia. Will continue to follow acutely and pt could benefit from follow-up PT at d/c.     Recommendations for follow up therapy are one component of a multi-disciplinary discharge planning process, led by the attending physician.  Recommendations may be updated based on patient status, additional functional criteria and insurance authorization.  Follow Up Recommendations       Assistance Recommended at Discharge Frequent or constant Supervision/Assistance  Patient can return home with the following  A little help with walking and/or transfers;A little help with  bathing/dressing/bathroom;Assistance with cooking/housework;Direct supervision/assist for medications management;Direct supervision/assist for financial management;Assist for transportation;Help with stairs or ramp for entrance    Equipment Recommendations None recommended by PT (has all needed DME)  Recommendations for Other Services       Functional Status Assessment Patient has had a recent decline in their functional status and demonstrates the ability to make significant improvements in function in a reasonable and predictable amount of time.     Precautions / Restrictions Precautions Precautions: Fall Restrictions Weight Bearing Restrictions: No      Mobility  Bed Mobility Overal bed mobility: Needs Assistance Bed Mobility: Supine to Sit, Sit to Supine     Supine to sit: HOB elevated, Min assist Sit to supine: Mod assist, HOB elevated   General bed mobility comments: MinA to lift trunk to sit up R EOB with extra time and multi-modal cues to initiate. ModA to direct trunk and legs with return to supine as pt initially looked like she was about to lay down at the foot of the bed    Transfers Overall transfer level: Needs assistance Equipment used: Rolling walker (2 wheels), 1 person hand held assist Transfers: Sit to/from Stand Sit to Stand: Min assist           General transfer comment: MinA to come to stand from EOB 3x, 2x to RW and 1x to PT with anterior approach HHA. Pt needs extra time and cues to initiate.    Ambulation/Gait Ambulation/Gait assistance: Min assist Gait Distance (Feet): 60 Feet Assistive device: Rolling walker (2 wheels) Gait Pattern/deviations: Step-through pattern, Decreased step length - right, Decreased step length -  left, Decreased stride length, Decreased dorsiflexion - right, Decreased dorsiflexion - left, Shuffle, Trunk flexed, Drifts right/left Gait velocity: reduced Gait velocity interpretation: <1.31 ft/sec, indicative of household  ambulator   General Gait Details: Pt takes slow, small shuffling steps with a flexed posture and inferior gaze. Pt needing verbal and tactile cues to improve posture and advance feet, intermittently needing RW guidance due to veering the RW to the L often. MinA for balance. Pt stopping when approaching where the floor changes from one tile color to another, like a line, and taking small, stuttering steps in place, almost Parkinsonian like.  Stairs            Wheelchair Mobility    Modified Rankin (Stroke Patients Only)       Balance Overall balance assessment: Needs assistance Sitting-balance support: No upper extremity supported, Feet supported Sitting balance-Leahy Scale: Fair     Standing balance support: Bilateral upper extremity supported, During functional activity Standing balance-Leahy Scale: Poor Standing balance comment: Reliant on RW and up to minA                             Pertinent Vitals/Pain Pain Assessment Pain Assessment: Faces Faces Pain Scale: Hurts a little bit Pain Location: discomfort with coughing Pain Descriptors / Indicators: Grimacing Pain Intervention(s): Limited activity within patient's tolerance, Monitored during session    Home Living Family/patient expects to be discharged to:: Private residence Living Arrangements: Children Available Help at Discharge: Family;Available 24 hours/day (daughter, granddaughter) Type of Home: House Home Access: Ramped entrance     Alternate Level Stairs-Number of Steps: 3 Home Layout: Other (Comment);Multi-level (split level) Home Equipment: Rolling Walker (2 wheels);Rollator (4 wheels);Shower seat;BSC/3in1 Additional Comments: split level house, 3 steps for pt to access bedroom and bathroom.    Prior Function Prior Level of Function : Independent/Modified Independent             Mobility Comments: independent, recently returned from family vacation ADLs Comments: setup for showers      Hand Dominance        Extremity/Trunk Assessment   Upper Extremity Assessment Upper Extremity Assessment: Defer to OT evaluation    Lower Extremity Assessment Lower Extremity Assessment: RLE deficits/detail;LLE deficits/detail;Generalized weakness RLE Deficits / Details: MMT scores of 3+ hip flexion (chronic issues after a prior fx per daughter), 4+ knee extension, 4+ ankle dorsiflexion; denied numbness/tingling bil LLE Deficits / Details: MMT scores of 4 hip flexion, 4+ knee extension, 4+ ankle dorsiflexion; denied numbness/tingling bil    Cervical / Trunk Assessment Cervical / Trunk Assessment: Normal  Communication   Communication: No difficulties  Cognition Arousal/Alertness: Awake/alert Behavior During Therapy: WFL for tasks assessed/performed, Flat affect Overall Cognitive Status: History of cognitive impairments - at baseline                                 General Comments: able to answer questions appropriately. slow processing, STM deficits, difficulty initiating        General Comments General comments (skin integrity, edema, etc.): provided daughter with gait belt and discussed home set-up and pt staying on couch downstairs for now until her cog and strength improve due to her high risk for falls, confusion, and in case of emergency    Exercises     Assessment/Plan    PT Assessment Patient needs continued PT services  PT Problem List Decreased strength;Decreased activity  tolerance;Decreased balance;Decreased mobility;Decreased cognition;Decreased coordination;Decreased knowledge of use of DME       PT Treatment Interventions DME instruction;Gait training;Stair training;Functional mobility training;Therapeutic activities;Therapeutic exercise;Balance training;Neuromuscular re-education;Cognitive remediation;Patient/family education    PT Goals (Current goals can be found in the Care Plan section)  Acute Rehab PT Goals Patient Stated Goal: to  improve PT Goal Formulation: With patient/family Time For Goal Achievement: 05/23/22 Potential to Achieve Goals: Good    Frequency Min 3X/week     Co-evaluation               AM-PAC PT "6 Clicks" Mobility  Outcome Measure Help needed turning from your back to your side while in a flat bed without using bedrails?: A Little Help needed moving from lying on your back to sitting on the side of a flat bed without using bedrails?: A Little Help needed moving to and from a bed to a chair (including a wheelchair)?: A Little Help needed standing up from a chair using your arms (e.g., wheelchair or bedside chair)?: A Little Help needed to walk in hospital room?: A Little Help needed climbing 3-5 steps with a railing? : A Lot 6 Click Score: 17    End of Session Equipment Utilized During Treatment: Gait belt Activity Tolerance: Patient tolerated treatment well Patient left: in bed;with call bell/phone within reach;with bed alarm set;with family/visitor present Nurse Communication: Mobility status PT Visit Diagnosis: Unsteadiness on feet (R26.81);Other abnormalities of gait and mobility (R26.89);Muscle weakness (generalized) (M62.81);Difficulty in walking, not elsewhere classified (R26.2)    Time: 1417-1500 PT Time Calculation (min) (ACUTE ONLY): 43 min   Charges:   PT Evaluation $PT Eval Moderate Complexity: 1 Mod PT Treatments $Gait Training: 8-22 mins $Therapeutic Activity: 8-22 mins        Moishe Spice, PT, DPT Acute Rehabilitation Services  Office: [phone]   Orvan Falconer 05/09/2022, 3:12 PM

## 2022-05-09 NOTE — Progress Notes (Addendum)
STROKE TEAM PROGRESS NOTE   SUBJECTIVE (INTERVAL HISTORY) Her daughter is at the bedside.  Per daughter, patient had a cold a few days ago They found her on the floor yesterday morning, they did not think she fell but likely slipped off of her bed. Patient was confused with generalized weakness Patient's history and presentation concerning for possible syncope episode. Discussed plan to complete stroke workup and possible discharge home today No evidence of acute infarct on brain imaging   OBJECTIVE Vitals:   05/09/22 0404 05/09/22 0855 05/09/22 1219 05/09/22 1335  BP: (!) 117/46 (!) 114/44 (!) 97/41 (!) 122/109  Pulse: (!) 46 68 (!) 53   Resp: 17 15 17 18   Temp:  99.8 F (37.7 C) 98.8 F (37.1 C)   TempSrc:  Oral Oral   SpO2: 95% 95% 94%   Weight:      Height:        CBC:  Recent Labs  Lab 05/08/22 1214 05/08/22 1901 05/09/22 0735  WBC 11.6* 10.6* 10.2  NEUTROABS 8.9*  --   --   HGB 13.4 12.0 11.9*  HCT 41.2 36.5 35.8*  MCV 96.9 95.8 95.5  PLT 148* 141* 129*    Basic Metabolic Panel:  Recent Labs  Lab 05/08/22 1214 05/08/22 1901 05/09/22 0735  NA 137  --  137  K 3.8  --  3.2*  CL 104  --  106  CO2 25  --  22  GLUCOSE 115*  --  95  BUN 13  --  13  CREATININE 0.84 0.96 0.93  CALCIUM 8.6*  --  8.2*  MG 2.0  --   --     Lipid Panel:  Recent Labs  Lab 05/08/22 1450  CHOL 184  TRIG 58  HDL 60  CHOLHDL 3.1  VLDL 12  LDLCALC 112*   HgbA1c:  Lab Results  Component Value Date   HGBA1C 5.5 05/08/2022   Urine Drug Screen: No results found for: "LABOPIA", "COCAINSCRNUR", "LABBENZ", "AMPHETMU", "THCU", "LABBARB"  Alcohol Level No results found for: "ETH"  IMAGING  Results for orders placed or performed during the hospital encounter of 05/08/22  MR BRAIN WO CONTRAST   Narrative   CLINICAL DATA:  Provided history: Mental status change, persistent or worsening. Increased weakness. Patient found down.  EXAM: MRI HEAD WITHOUT  CONTRAST  TECHNIQUE: Multiplanar, multiecho pulse sequences of the brain and surrounding structures were obtained without intravenous contrast.  COMPARISON:  Head CT 05/08/2022. Brain MRI 09/06/2013.  FINDINGS: Intermittently motion degraded examination, limiting evaluation. Most notably, there is severe motion degradation of the sagittal T1-weighted sequence, moderate/severe motion degradation of the axial T2 sequence and moderate/severe motion degradation of the axial T2 FLAIR sequence.  Brain:  Mild-to-moderate generalized parenchymal atrophy.  Moderately advanced multifocal T2 FLAIR hyperintense signal abnormality within the cerebral white matter, nonspecific but compatible with chronic small vessel ischemic disease.  There are a few punctate chronic microhemorrhages scattered within the supratentorial brain.  The diffusion-weighted imaging is of good quality and there is no evidence of an acute infarct.  Within the limitations of motion degradation, no intracranial mass or extra-axial fluid collection is identified. No midline shift.  Vascular: Maintained flow voids within the proximal large arterial vessels.  Skull and upper cervical spine: Within the limitations of motion degradation, no focal suspicious marrow lesion is identified.  Sinuses/Orbits: No mass or acute finding within the imaged orbits. Prior bilateral ocular lens replacement. Mild mucosal thickening within the right maxillary sinus. Fluid level, and  mild background mucosal thickening, within the left maxillary sinus. Mild mucosal thickening within the right sphenoid sinus. Extensive T2 hyperintense opacification of the left sphenoid sinus. Mild mucosal thickening within bilateral ethmoid air cells.  IMPRESSION: 1. Significantly motion degraded examination, limiting evaluation. 2. The diffusion-weighted imaging is of good quality and there is no evidence of an acute infarct. 3. Moderately advanced  chronic small vessel ischemic changes within the cerebral white matter. 4. Mild-to-moderate generalized parenchymal atrophy. 5. Paranasal sinus disease, as described.   Electronically Signed   By: Kellie Simmering D.O.   On: 05/08/2022 18:30   Results for orders placed or performed during the hospital encounter of 01/29/09  MR Brain W Wo Contrast   Narrative   Clinical Data: Tongue deviation towards the right.  Headache. Recent fall.   BUN and creatinine were obtained on site.  Results:  BUN 16 mg/dL, Creatinine 0.6 mg/dL.   MRI HEAD WITHOUT AND WITH CONTRAST   Technique:  Multiplanar, multiecho pulse sequences of the brain and surrounding structures were obtained according to standard protocol without and with intravenous contrast   Contrast: 10 ml Multihance   Comparison: Head CT 09/13/2008   Findings: Diffusion imaging does not show any acute or subacute infarction.  No abnormalities seen affecting the brainstem or cerebellum.  The cerebral hemispheres show age related atrophy with chronic small vessel disease affecting the deep and subcortical white matter.  No cortical or large vessel infarction.  No intra- axial mass lesion, hemorrhage, hydrocephalus or extra-axial collection.  There is a 1 cm cyst adjacent to the atrium of the right lateral ventricle.   No cranial nerve lesion is identified.  No skull or skull base lesion is seen.   Paranasal sinuses, middle ears and mastoids are clear.   IMPRESSION: No cause of the patient's described symptoms is identified. Specifically, no brainstem or lower cranial nerve lesion is seen.   Age related atrophy and chronic small vessel disease in the hemispheric white matter.  Provider: Iris Pert, Luiz Ochoa  Results for orders placed or performed during the hospital encounter of 09/13/08  CT Head Wo Contrast   Narrative   Clinical Data: Syncope   CT HEAD WITHOUT CONTRAST   Technique:  Contiguous axial images were  obtained from the base of the skull through the vertex without contrast.   Comparison: None   Findings: There is no evidence of acute intracranial hemorrhage, brain edema, mass lesion, acute infarction,   mass effect, or midline shift. Acute infarct may be inapparent on noncontrast CT. No other intra-axial abnormalities are seen, and the ventricles and sulci are within normal limits in size and symmetry.   No abnormal extra-axial fluid collections or masses are identified.  No significant calvarial abnormality.   IMPRESSION: Negative for bleed or other acute intracranial process.  Provider: Debbra Riding, Denton Meek       PHYSICAL EXAM General: Well-appearing elderly woman sitting in bed. HEENT: [place]/AT.  EOMI.  Anicteric sclera Respiratory: Unlabored. Cardiovascular: Regular rate and rhythm Extremities: Well-perfused  Neuro: Alert and awake.  Normal speech fluency and comprehension.  No dysarthria or aphasia.  No drift in bilateral upper extremities and left lower extremity.  No dysmetria.  Right lower extremity with drift secondary to chronic right hip pain and weakness.  Normal sensation to light touch.   ASSESSMENT/PLAN Ms. Jeanne Lopez is a 89 y.o. female with history of dementia, anxiety and depression who presented with weakness and a concern for right lower extremity weakness/drift. CT  head and MRI brain negative for acute intracranial abnormalities. Presentation not consistent with stroke. TIA possibility.  More likely an unwitnessed syncope episode  TIA versus syncopal episode.  Doubt seizure: Imaging negative.  Patient with no significant neurological deficit, RLE weakness is patient's baseline. CT head no acute intracranial pathology MRI head no evidence of acute infarct, moderately advanced chronic small vessel ischemic changes, mild to moderate generalized parenchymal atrophy Carotid Doppler normal flow hemodynamics, no clinically significant stenosis 2D  Echo EF 60-65%, moderate LVH, G1 DD, moderately dilated left atrium and moderate pericardial effusion Transcranial Doppler unremarkable LDL 112 HgbA1c 5.5% Diet Order             Diet - low sodium heart healthy           Diet regular Room service appropriate? Yes; Fluid consistency: Thin  Diet effective now                  No antithrombotic prior to admission, now on Plavix and aspirin ASA 81 mg daily and Plavix 75 mg daily for 3 weeks then ASA 81 mg daily Patient counseled to be compliant with her antithrombotic medications Ongoing aggressive stroke risk factor management Therapy recommendations: Home with PT/OT Disposition: Home  Hypertension Stable with SBP in the 90s to 120s  Permissive hypertension (OK if < 220/120) but gradually normalize in 5-7 days  Long-term BP goal normotensive  Hyperlipidemia Not on any statin prior to admission LDL 112, goal < 70 Lipitor 40 mg daily Continue statin on discharge  Diabetes type II HgbA1c 5.5%, goal < 7.0  Other Stroke Risk Factors Advanced age   Other Active Problems Dementia  Hospital day # 1  I have personally obtained history,examined this patient, reviewed notes, independently viewed imaging studies, participated in medical decision making and plan of care.ROS completed by me personally and pertinent positives fully documented  I have made any additions or clarifications directly to the above note. Agree with note above.  Patient is a poor historian and does not remember what happened.  She was found in the morning lying at the bottom of her bed without obvious injury.  She had generalized weakness which has resolved.  MRI is negative for acute stroke.  Recommend check EEG for seizures and cardiac monitoring for syncope.  Continue ongoing stroke workup.  Long discussion with the patient and daughter at the bedside and with Dr. Sloan Leiter.  Greater than 50% time during this 50-minute visit was spent in counseling and  coordination of care about her episode of possible TIA versus syncope and discussion about evaluation and treatment [place] questions.  Antony Contras, MD Medical Director [facility] Pager: [phone] 05/09/2022 4:56 PM  To contact Stroke Continuity provider, please refer to [email or web address] After hours, contact General Neurology

## 2022-05-09 NOTE — Evaluation (Signed)
Occupational Therapy Evaluation Patient Details Name: Jeanne Lopez MRN: [id number] DOB: Jun 21, 1932 Today's Date: 05/09/2022   History of Present Illness Pt is 89 yo F who presented to ED after she was found on the ground, global weakness and confusion. PMH includes anxiety, depression, dementia.   Clinical Impression   Pt admitted with the above diagnoses and presents with below problem list. Pt will benefit from continued acute OT to address the below listed deficits and maximize independence with basic ADLs prior to d/c home with family. PTA, pt was independent with ADLs, setup/supervision for showering (tub shower). Pt currently needs close min guard assist and use of external support (rw) for LB ADLs, functional mobility and transfers. Daughter present and involved throughout session.        Recommendations for follow up therapy are one component of a multi-disciplinary discharge planning process, led by the attending physician.  Recommendations may be updated based on patient status, additional functional criteria and insurance authorization.   Assistance Recommended at Discharge Frequent or constant Supervision/Assistance  Patient can return home with the following A little help with walking and/or transfers;A little help with bathing/dressing/bathroom;Assist for transportation    Functional Status Assessment  Patient has had a recent decline in their functional status and demonstrates the ability to make significant improvements in function in a reasonable and predictable amount of time.  Equipment Recommendations  None recommended by OT    Recommendations for Other Services PT consult     Precautions / Restrictions Precautions Precautions: Fall Restrictions Weight Bearing Restrictions: No      Mobility Bed Mobility Overal bed mobility: Needs Assistance Bed Mobility: Supine to Sit, Sit to Supine     Supine to sit: Min guard, HOB elevated Sit to supine: Min guard, Min  assist   General bed mobility comments: HOB elevated. extra time and effort. assist for truncal suport    Transfers Overall transfer level: Needs assistance Equipment used: Rolling walker (2 wheels) Transfers: Sit to/from Stand Sit to Stand: Min guard, Min assist           General transfer comment: close minguard, light assist. to/from EOB. rw.      Balance Overall balance assessment: Needs assistance Sitting-balance support: No upper extremity supported, Feet supported Sitting balance-Leahy Scale: Fair     Standing balance support: Bilateral upper extremity supported, During functional activity Standing balance-Leahy Scale: Poor Standing balance comment: needs external support for static standing balance                           ADL either performed or assessed with clinical judgement   ADL Overall ADL's : Needs assistance/impaired Eating/Feeding: Set up;Sitting   Grooming: Set up;Sitting   Upper Body Bathing: Set up;Sitting   Lower Body Bathing: Min guard;Sit to/from stand   Upper Body Dressing : Set up;Sitting   Lower Body Dressing: Min guard;Sit to/from stand   Toilet Transfer: Min guard;Ambulation;Rolling walker (2 wheels)   Toileting- Clothing Manipulation and Hygiene: Min guard;Sitting/lateral lean;Sit to/from stand       Functional mobility during ADLs: Min guard;Rolling walker (2 wheels) General ADL Comments: close min guard as pt with mild unsteadiness, no overt LOB. drifting to the left a bit returning to bed bumping into sink with physical cues needed to correct trajectory     Vision Baseline Vision/History: 1 Wears glasses       Perception     Praxis      Pertinent Vitals/Pain  Pain Assessment Pain Assessment: No/denies pain     Hand Dominance     Extremity/Trunk Assessment Upper Extremity Assessment Upper Extremity Assessment: Generalized weakness   Lower Extremity Assessment Lower Extremity Assessment: Defer to PT  evaluation   Cervical / Trunk Assessment Cervical / Trunk Assessment: Normal   Communication Communication Communication: No difficulties   Cognition Arousal/Alertness: Awake/alert Behavior During Therapy: WFL for tasks assessed/performed, Flat affect Overall Cognitive Status: History of cognitive impairments - at baseline                                 General Comments: able to answer questions appropriately. slow processing, STM deficits     General Comments  daughter present throughout and involved in session.    Exercises     Shoulder Instructions      Home Living Family/patient expects to be discharged to:: Private residence Living Arrangements: Children Available Help at Discharge: Family;Available 24 hours/day (daughter, granddaughter) Type of Home: House Home Access: Ramped entrance     Home Layout: Other (Comment);Multi-level (split level) Alternate Level Stairs-Number of Steps: 3 Alternate Level Stairs-Rails: Right;Left Bathroom Shower/Tub: Teacher, early years/pre: Standard     Home Equipment: Conservation officer, nature (2 wheels);Rollator (4 wheels);Shower seat;BSC/3in1   Additional Comments: split level house, 3 steps for pt to access bedroom and bathroom.      Prior Functioning/Environment Prior Level of Function : Independent/Modified Independent             Mobility Comments: independent, recently returned from family vacation ADLs Comments: setup for showers        OT Problem List: Decreased strength;Decreased activity tolerance;Impaired balance (sitting and/or standing);Decreased knowledge of precautions;Decreased knowledge of use of DME or AE      OT Treatment/Interventions: Self-care/ADL training;Energy conservation;DME and/or AE instruction;Therapeutic activities;Balance training;Patient/family education    OT Goals(Current goals can be found in the care plan section) Acute Rehab OT Goals Patient Stated Goal: feel  better OT Goal Formulation: With patient/family Time For Goal Achievement: 05/23/22 Potential to Achieve Goals: Good ADL Goals Pt Will Perform Grooming: with modified independence;standing Pt Will Perform Upper Body Dressing: with set-up;sitting Pt Will Perform Lower Body Dressing: with supervision;with set-up;sit to/from stand Pt Will Transfer to Toilet: with supervision;ambulating Pt Will Perform Toileting - Clothing Manipulation and hygiene: with set-up;with supervision;sit to/from stand;sitting/lateral leans Additional ADL Goal #1: pt will complete bed mobility at supervision level to prepare for EOB/OOB ADLs.  OT Frequency: Min 2X/week    Co-evaluation              AM-PAC OT "6 Clicks" Daily Activity     Outcome Measure Help from another person eating meals?: None Help from another person taking care of personal grooming?: None Help from another person toileting, which includes using toliet, bedpan, or urinal?: A Little Help from another person bathing (including washing, rinsing, drying)?: A Little Help from another person to put on and taking off regular upper body clothing?: A Little Help from another person to put on and taking off regular lower body clothing?: A Little 6 Click Score: 20   End of Session Equipment Utilized During Treatment: Rolling walker (2 wheels)  Activity Tolerance: Patient limited by fatigue;Patient tolerated treatment well Patient left: in bed;with call bell/phone within reach;with bed alarm set;with family/visitor present  OT Visit Diagnosis: Unsteadiness on feet (R26.81);Muscle weakness (generalized) (M62.81)  Time: [id number] OT Time Calculation (min): 22 min Charges:  OT General Charges $OT Visit: 1 Visit OT Evaluation $OT Eval Moderate Complexity: [place], OT Acute Rehabilitation Services Office: [phone]   Hortencia Pilar 05/09/2022, 11:39 AM

## 2022-05-09 NOTE — Care Management Obs Status (Signed)
[place] NOTIFICATION   Patient Details  Name: Jeanne Lopez MRN: [id number] Date of Birth: 1932/08/29   Medicare Observation Status Notification Given:  Yes    Pollie Friar, RN 05/09/2022, 4:17 PM

## 2022-05-09 NOTE — TOC Transition Note (Signed)
Transition of Care [facility]) - CM/SW Discharge Note   Patient Details  Name: Jeanne Lopez MRN: [id number] Date of Birth: April 25, 1932  [facility] CM/SW Contact:  Pollie Friar, RN Phone Number: 05/09/2022, 3:03 PM   Clinical Narrative:    Pt is discharging home with home health services through [facility]. Information on the AVS. Pt has all needed DME at home. (Walker/ wheelchair/ BSC/ cane/ ramp) Pt lives with her daughter and granddaughter. Pt has 24 hour supervision. Daughter over sees all of her medications and provides needed transportation. Daughter transporting home today.    Final next level of care: Home w Home Health Services Barriers to Discharge: No Barriers Identified   Patient Goals and CMS Choice CMS Medicare.gov Compare Post Acute Care list provided to:: Patient Represenative (must comment) Choice offered to / list presented to : Adult Children  Discharge Placement                         Discharge Plan and Services Additional resources added to the After Visit Summary for                            [employer] Arranged: PT, OT [place] Agency: [place] Date [facility] Agency Contacted: 05/09/22   Representative spoke with at [place]: [place] Determinants of Health ([place]) Interventions SDOH Screenings   Food Insecurity: No Food Insecurity (05/08/2022)  Housing: Low Risk  (05/08/2022)  Transportation Needs: No Transportation Needs (05/08/2022)  Utilities: Not At Risk (05/08/2022)  Financial Resource Strain: Low Risk  (03/18/2018)  Physical Activity: Sufficiently Active (03/18/2018)  Social Connections: Somewhat Isolated (03/18/2018)  Stress: No Stress Concern Present (03/18/2018)  Tobacco Use: Low Risk  (05/08/2022)     Readmission Risk Interventions     No data to display

## 2022-05-09 NOTE — Care Management CC44 (Signed)
Condition Code 44 Documentation Completed  Patient Details  Name: Jeanne Lopez MRN: [id number] Date of Birth: March 04, 1932   Condition Code 44 given:  Yes Patient signature on Condition Code 44 notice:  Yes Documentation of 2 MD's agreement:  Yes Code 44 added to claim:  Yes    Pollie Friar, RN 05/09/2022, 4:17 PM

## 2022-05-12 DIAGNOSIS — F0393 Unspecified dementia, unspecified severity, with mood disturbance: Secondary | ICD-10-CM | POA: Diagnosis not present

## 2022-05-12 DIAGNOSIS — F418 Other specified anxiety disorders: Secondary | ICD-10-CM | POA: Diagnosis not present

## 2022-05-12 DIAGNOSIS — F0394 Unspecified dementia, unspecified severity, with anxiety: Secondary | ICD-10-CM | POA: Diagnosis not present

## 2022-05-12 DIAGNOSIS — F05 Delirium due to known physiological condition: Secondary | ICD-10-CM | POA: Diagnosis not present

## 2022-05-12 DIAGNOSIS — F03918 Unspecified dementia, unspecified severity, with other behavioral disturbance: Secondary | ICD-10-CM | POA: Diagnosis not present

## 2022-05-12 DIAGNOSIS — I69351 Hemiplegia and hemiparesis following cerebral infarction affecting right dominant side: Secondary | ICD-10-CM | POA: Diagnosis not present

## 2022-05-12 DIAGNOSIS — I1310 Hypertensive heart and chronic kidney disease without heart failure, with stage 1 through stage 4 chronic kidney disease, or unspecified chronic kidney disease: Secondary | ICD-10-CM | POA: Diagnosis not present

## 2022-05-12 DIAGNOSIS — N189 Chronic kidney disease, unspecified: Secondary | ICD-10-CM | POA: Diagnosis not present

## 2022-05-12 DIAGNOSIS — I3139 Other pericardial effusion (noninflammatory): Secondary | ICD-10-CM | POA: Diagnosis not present

## 2022-05-12 IMAGING — RF DG C-ARM 1-60 MIN
1 series · 6 of 6 positions shown · non-contrast
Comparison: Right femur radiographs October 05, 2019

FLUOROSCOPY TIME:  2 minutes 27 seconds; 16.60 mGy; 6 acquired
images.

CLINICAL DATA: Open reduction internal fixation for fracture

EXAM:
RIGHT FEMUR 2 VIEWS; DG C-ARM 1-60 MIN

[Series 1: run · 6 of 6 slices shown]
[im 1/6]
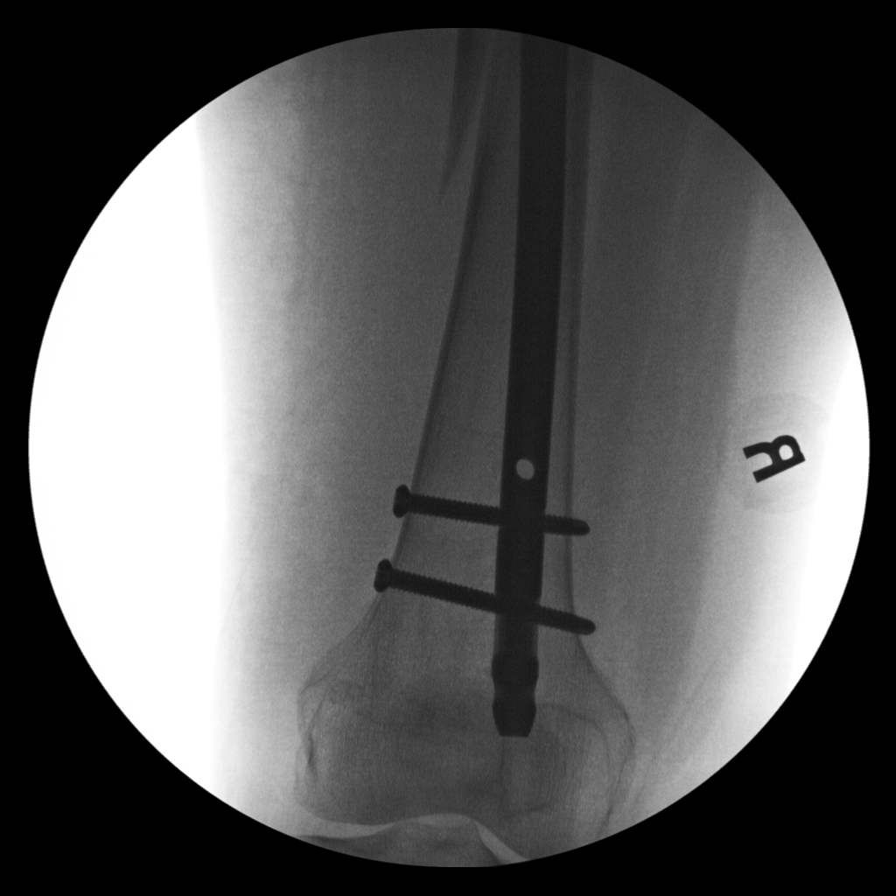
[im 2/6]
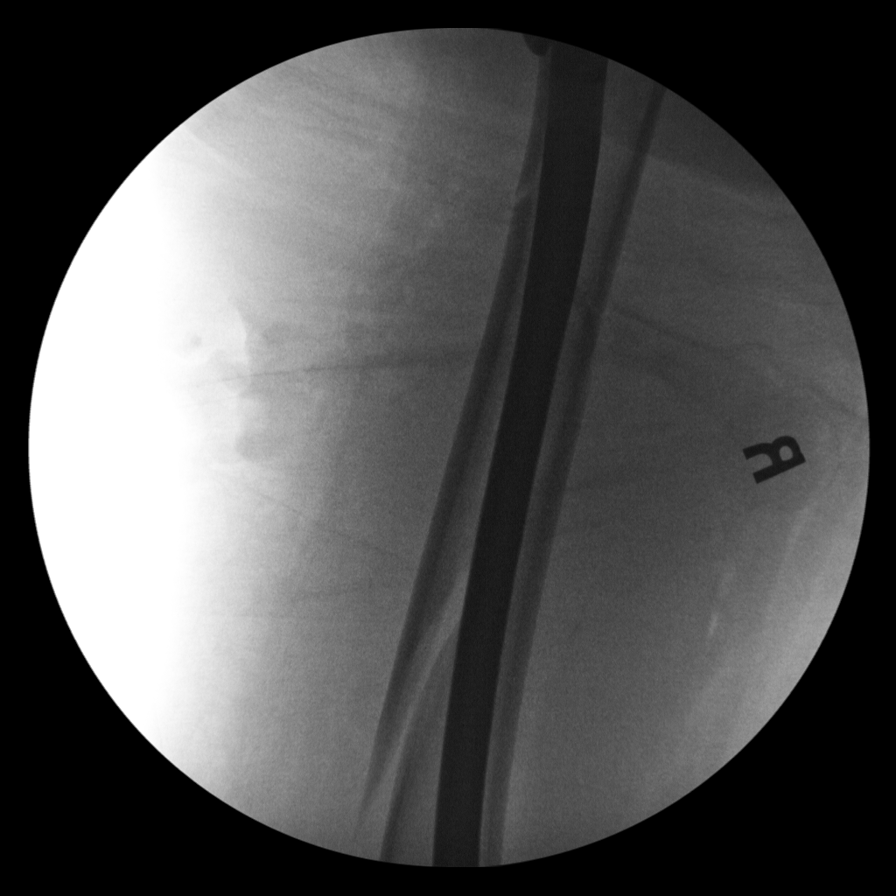
[im 3/6]
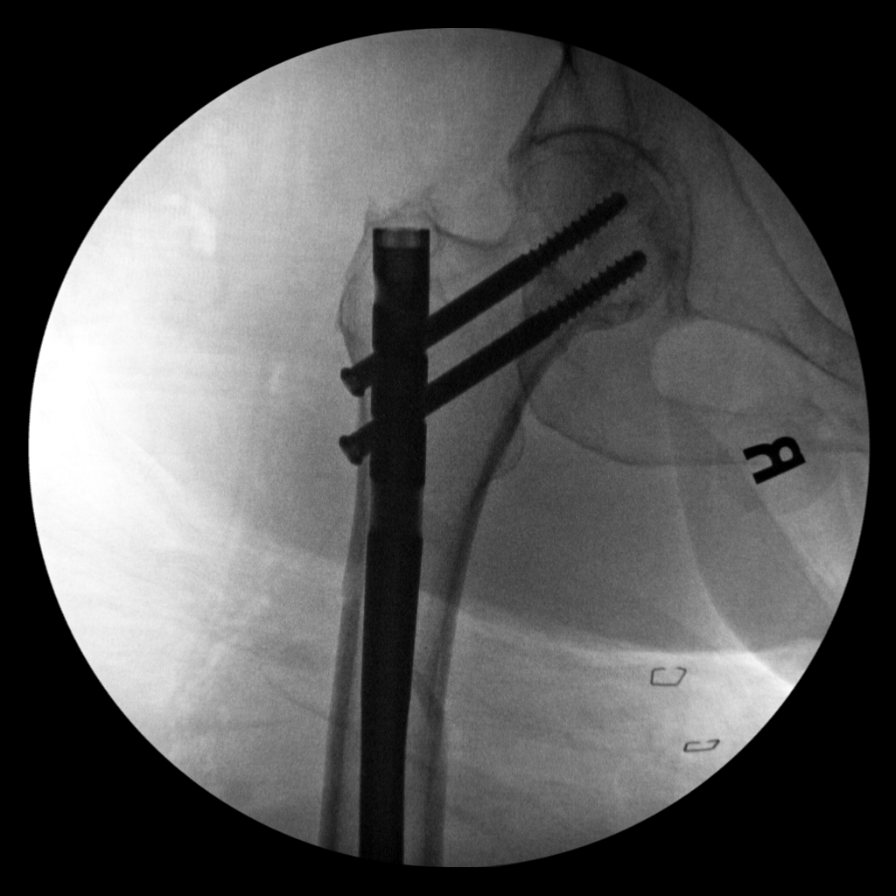
[im 4/6]
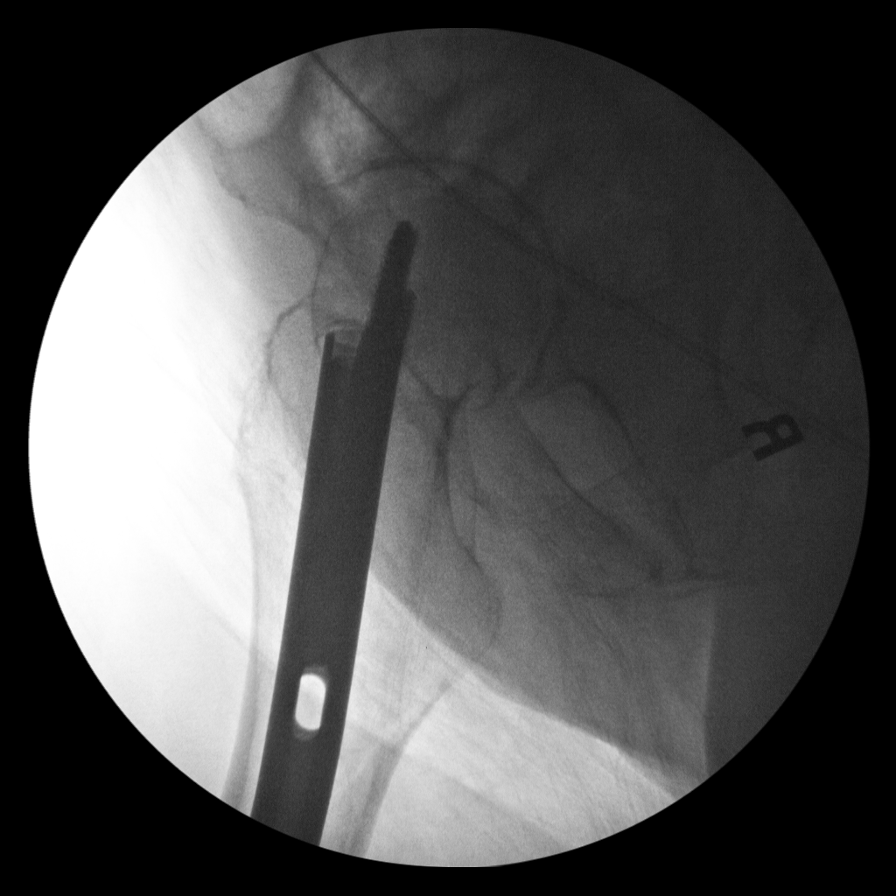
[im 5/6]
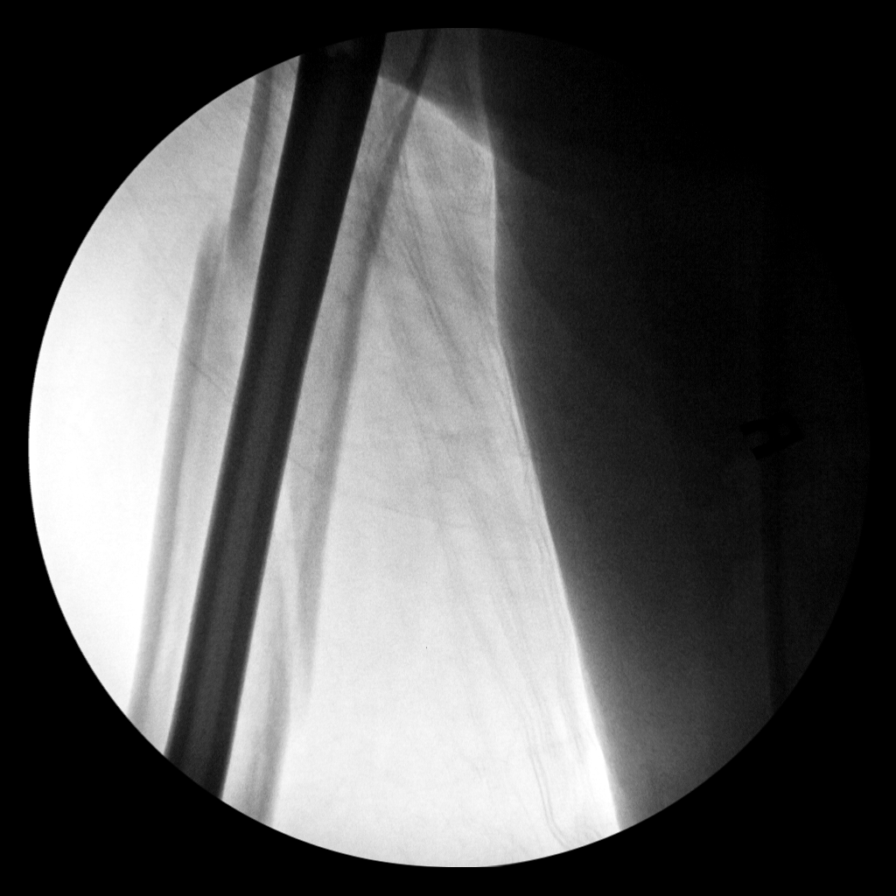
[im 6/6]
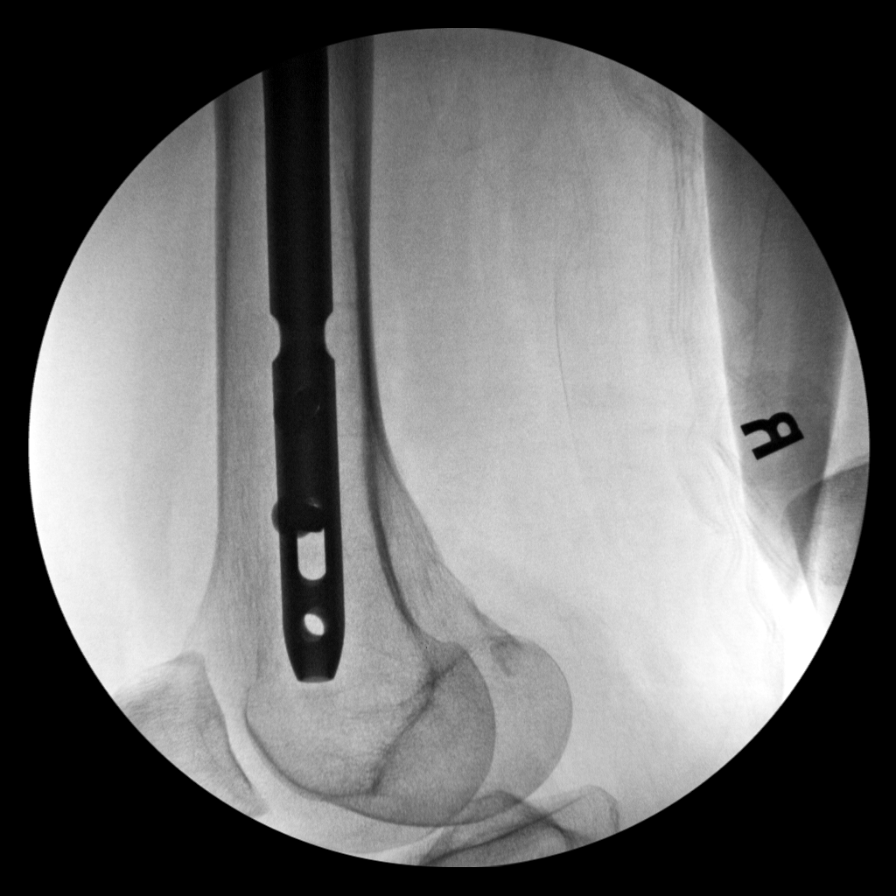

[6 of 6 positions shown; findings below may reference images not displayed]

FINDINGS: Frontal and lateral views show rod fixation through a comminuted
fracture of the mid femur. There are fixating screws proximally with
screw tips in the femoral head region. At the fracture site, there
is still mild displacement of fracture fragments but less
displacement than on preoperative study. No new fracture. No
dislocation. There is mild narrowing of the right hip joint.
IMPRESSION: Fixation through fracture of the mid femur with alignment near
anatomic. No new fracture. No dislocation. Fixation screws in
proximal femoral head. Moderate narrowing right hip joint.

## 2022-05-13 DIAGNOSIS — I69351 Hemiplegia and hemiparesis following cerebral infarction affecting right dominant side: Secondary | ICD-10-CM | POA: Diagnosis not present

## 2022-05-13 DIAGNOSIS — F03918 Unspecified dementia, unspecified severity, with other behavioral disturbance: Secondary | ICD-10-CM | POA: Diagnosis not present

## 2022-05-13 DIAGNOSIS — N189 Chronic kidney disease, unspecified: Secondary | ICD-10-CM | POA: Diagnosis not present

## 2022-05-13 DIAGNOSIS — F0393 Unspecified dementia, unspecified severity, with mood disturbance: Secondary | ICD-10-CM | POA: Diagnosis not present

## 2022-05-13 DIAGNOSIS — F418 Other specified anxiety disorders: Secondary | ICD-10-CM | POA: Diagnosis not present

## 2022-05-13 DIAGNOSIS — I3139 Other pericardial effusion (noninflammatory): Secondary | ICD-10-CM | POA: Diagnosis not present

## 2022-05-13 DIAGNOSIS — F0394 Unspecified dementia, unspecified severity, with anxiety: Secondary | ICD-10-CM | POA: Diagnosis not present

## 2022-05-13 DIAGNOSIS — I1310 Hypertensive heart and chronic kidney disease without heart failure, with stage 1 through stage 4 chronic kidney disease, or unspecified chronic kidney disease: Secondary | ICD-10-CM | POA: Diagnosis not present

## 2022-05-13 DIAGNOSIS — F05 Delirium due to known physiological condition: Secondary | ICD-10-CM | POA: Diagnosis not present

## 2022-05-15 DIAGNOSIS — F05 Delirium due to known physiological condition: Secondary | ICD-10-CM | POA: Diagnosis not present

## 2022-05-15 DIAGNOSIS — I69351 Hemiplegia and hemiparesis following cerebral infarction affecting right dominant side: Secondary | ICD-10-CM | POA: Diagnosis not present

## 2022-05-15 DIAGNOSIS — F418 Other specified anxiety disorders: Secondary | ICD-10-CM | POA: Diagnosis not present

## 2022-05-15 DIAGNOSIS — F0394 Unspecified dementia, unspecified severity, with anxiety: Secondary | ICD-10-CM | POA: Diagnosis not present

## 2022-05-15 DIAGNOSIS — F0393 Unspecified dementia, unspecified severity, with mood disturbance: Secondary | ICD-10-CM | POA: Diagnosis not present

## 2022-05-15 DIAGNOSIS — F03918 Unspecified dementia, unspecified severity, with other behavioral disturbance: Secondary | ICD-10-CM | POA: Diagnosis not present

## 2022-05-15 DIAGNOSIS — I3139 Other pericardial effusion (noninflammatory): Secondary | ICD-10-CM | POA: Diagnosis not present

## 2022-05-15 DIAGNOSIS — N189 Chronic kidney disease, unspecified: Secondary | ICD-10-CM | POA: Diagnosis not present

## 2022-05-15 DIAGNOSIS — I1310 Hypertensive heart and chronic kidney disease without heart failure, with stage 1 through stage 4 chronic kidney disease, or unspecified chronic kidney disease: Secondary | ICD-10-CM | POA: Diagnosis not present

## 2022-05-20 DIAGNOSIS — F0393 Unspecified dementia, unspecified severity, with mood disturbance: Secondary | ICD-10-CM | POA: Diagnosis not present

## 2022-05-20 DIAGNOSIS — F0394 Unspecified dementia, unspecified severity, with anxiety: Secondary | ICD-10-CM | POA: Diagnosis not present

## 2022-05-20 DIAGNOSIS — F05 Delirium due to known physiological condition: Secondary | ICD-10-CM | POA: Diagnosis not present

## 2022-05-20 DIAGNOSIS — N189 Chronic kidney disease, unspecified: Secondary | ICD-10-CM | POA: Diagnosis not present

## 2022-05-20 DIAGNOSIS — I69351 Hemiplegia and hemiparesis following cerebral infarction affecting right dominant side: Secondary | ICD-10-CM | POA: Diagnosis not present

## 2022-05-20 DIAGNOSIS — F418 Other specified anxiety disorders: Secondary | ICD-10-CM | POA: Diagnosis not present

## 2022-05-20 DIAGNOSIS — F03918 Unspecified dementia, unspecified severity, with other behavioral disturbance: Secondary | ICD-10-CM | POA: Diagnosis not present

## 2022-05-20 DIAGNOSIS — I3139 Other pericardial effusion (noninflammatory): Secondary | ICD-10-CM | POA: Diagnosis not present

## 2022-05-20 DIAGNOSIS — I1310 Hypertensive heart and chronic kidney disease without heart failure, with stage 1 through stage 4 chronic kidney disease, or unspecified chronic kidney disease: Secondary | ICD-10-CM | POA: Diagnosis not present

## 2022-05-27 DIAGNOSIS — F05 Delirium due to known physiological condition: Secondary | ICD-10-CM | POA: Diagnosis not present

## 2022-05-27 DIAGNOSIS — I3139 Other pericardial effusion (noninflammatory): Secondary | ICD-10-CM | POA: Diagnosis not present

## 2022-05-27 DIAGNOSIS — F03918 Unspecified dementia, unspecified severity, with other behavioral disturbance: Secondary | ICD-10-CM | POA: Diagnosis not present

## 2022-05-27 DIAGNOSIS — I1310 Hypertensive heart and chronic kidney disease without heart failure, with stage 1 through stage 4 chronic kidney disease, or unspecified chronic kidney disease: Secondary | ICD-10-CM | POA: Diagnosis not present

## 2022-05-27 DIAGNOSIS — N189 Chronic kidney disease, unspecified: Secondary | ICD-10-CM | POA: Diagnosis not present

## 2022-05-27 DIAGNOSIS — F0393 Unspecified dementia, unspecified severity, with mood disturbance: Secondary | ICD-10-CM | POA: Diagnosis not present

## 2022-05-27 DIAGNOSIS — I69351 Hemiplegia and hemiparesis following cerebral infarction affecting right dominant side: Secondary | ICD-10-CM | POA: Diagnosis not present

## 2022-05-27 DIAGNOSIS — F418 Other specified anxiety disorders: Secondary | ICD-10-CM | POA: Diagnosis not present

## 2022-05-27 DIAGNOSIS — F0394 Unspecified dementia, unspecified severity, with anxiety: Secondary | ICD-10-CM | POA: Diagnosis not present

## 2022-05-29 DIAGNOSIS — I3139 Other pericardial effusion (noninflammatory): Secondary | ICD-10-CM | POA: Diagnosis not present

## 2022-05-29 DIAGNOSIS — I69351 Hemiplegia and hemiparesis following cerebral infarction affecting right dominant side: Secondary | ICD-10-CM | POA: Diagnosis not present

## 2022-05-29 DIAGNOSIS — F05 Delirium due to known physiological condition: Secondary | ICD-10-CM | POA: Diagnosis not present

## 2022-05-29 DIAGNOSIS — F418 Other specified anxiety disorders: Secondary | ICD-10-CM | POA: Diagnosis not present

## 2022-05-29 DIAGNOSIS — F0394 Unspecified dementia, unspecified severity, with anxiety: Secondary | ICD-10-CM | POA: Diagnosis not present

## 2022-05-29 DIAGNOSIS — F0393 Unspecified dementia, unspecified severity, with mood disturbance: Secondary | ICD-10-CM | POA: Diagnosis not present

## 2022-05-29 DIAGNOSIS — I1310 Hypertensive heart and chronic kidney disease without heart failure, with stage 1 through stage 4 chronic kidney disease, or unspecified chronic kidney disease: Secondary | ICD-10-CM | POA: Diagnosis not present

## 2022-05-29 DIAGNOSIS — N189 Chronic kidney disease, unspecified: Secondary | ICD-10-CM | POA: Diagnosis not present

## 2022-05-29 DIAGNOSIS — F03918 Unspecified dementia, unspecified severity, with other behavioral disturbance: Secondary | ICD-10-CM | POA: Diagnosis not present

## 2022-06-04 ENCOUNTER — Other Ambulatory Visit: Payer: Self-pay

## 2022-06-04 DIAGNOSIS — Z515 Encounter for palliative care: Secondary | ICD-10-CM

## 2022-06-05 DIAGNOSIS — F0394 Unspecified dementia, unspecified severity, with anxiety: Secondary | ICD-10-CM | POA: Diagnosis not present

## 2022-06-05 DIAGNOSIS — F05 Delirium due to known physiological condition: Secondary | ICD-10-CM | POA: Diagnosis not present

## 2022-06-05 DIAGNOSIS — F03918 Unspecified dementia, unspecified severity, with other behavioral disturbance: Secondary | ICD-10-CM | POA: Diagnosis not present

## 2022-06-05 DIAGNOSIS — F0393 Unspecified dementia, unspecified severity, with mood disturbance: Secondary | ICD-10-CM | POA: Diagnosis not present

## 2022-06-05 DIAGNOSIS — I3139 Other pericardial effusion (noninflammatory): Secondary | ICD-10-CM | POA: Diagnosis not present

## 2022-06-05 DIAGNOSIS — F418 Other specified anxiety disorders: Secondary | ICD-10-CM | POA: Diagnosis not present

## 2022-06-05 DIAGNOSIS — I69351 Hemiplegia and hemiparesis following cerebral infarction affecting right dominant side: Secondary | ICD-10-CM | POA: Diagnosis not present

## 2022-06-05 DIAGNOSIS — I1310 Hypertensive heart and chronic kidney disease without heart failure, with stage 1 through stage 4 chronic kidney disease, or unspecified chronic kidney disease: Secondary | ICD-10-CM | POA: Diagnosis not present

## 2022-06-05 DIAGNOSIS — N189 Chronic kidney disease, unspecified: Secondary | ICD-10-CM | POA: Diagnosis not present

## 2022-06-09 ENCOUNTER — Other Ambulatory Visit: Payer: Self-pay

## 2022-06-09 VITALS — BP 90/58 | HR 58 | Resp 18

## 2022-06-09 DIAGNOSIS — Z515 Encounter for palliative care: Secondary | ICD-10-CM

## 2022-06-10 ENCOUNTER — Other Ambulatory Visit: Payer: Self-pay | Admitting: Neurology

## 2022-06-10 NOTE — Progress Notes (Unsigned)
1203 Palliative Care Initial Encounter Note  PATIENT NAME: Jeanne Lopez DOB: [birth year] MRN: [id number]  PRIMARY CARE PROVIDER: Merri Brunette, MD  RESPONSIBLE PARTY:  Acct ID - Guarantor Home Phone Work Phone Relationship Acct Type  [id number] YOKO, MCGAHEE* [phone] [id number] Self P/F     [address], [place], [place] [address]   RN completed home visit. Daughter, [name], also present .   HISTORY OF PRESENT ILLNESS:    89 year old with history of anxiety, Migraines, osteoporosis, hyperlipidemia, depression and dementia lives at home with her daughter.  Was hospitalized from 05/08/22- 05/09/22 with TIA vs viral syndrome. Discharged home with [facility] PT. Frances Furbish   Socially: Pt lives in split level  home for past 50 years. Daughter, granddaughter and two great grandsons live with pt. Lucila Maine and his family live across the street. Retired Agricultural consultant, [employer] school. Pt has a dog, Gracie, 15yo.   Cognitive: Alert. Unable to recall family members names. Is able to tell the age that she was when she got married. Unable to say how long she was married. Pt has a coloring book and likes to read.  RN to mail Dementia Resource Guide book to daughter. Gave an introductory explanation of booklet and then asked wife to look thru it. We will discuss further at next visit.   Appetite: 3 meals per day plus snacks. Pt drinks water and sweet tea. Encouraged pt to drink several sips at a time to increase volume.   Mobility: No issues with walking. Doesn't recall any falls. Dtr reports last fall was a couple of years ago. Frances Furbish doing [facility] PT with pt for "a few more visits."  Dtr reports that after walking for a while, pt will have weakness to right hip, leg "starts to lean, so we know she needs to rest." Pt and dtr walk to the stop sign on her road daily.   Sleeping Pattern: Pt and dtr gets that she gets about 11 hours of sleep per night consistently. Lays down in the afternoons around 3 to rest and  sometimes again around 6.   Pain: No issues with pain. Dtr reports that the only time she has pain or discomfort is when she has migraines and she takes Imitrex.  Gi/GU: Pt wears depends/pull up. Pt reports that she is never constipated, dtr reports that she is incontinent at times.   Palliative Care/ Hospice: RN explained role and purpose of palliative care including visit frequency. Also discussed benefits of hospice care as well as the differences between the two with patient.   Goals of Care: Goal is to keep pt at home until death.     CODE STATUS: Full Code  ADVANCED DIRECTIVES: N MOST FORM: No- Most form with instructions given to daughter.  PPS: 60%  Next appt scheduled: Dee, LPN to call to schedule next appt. Daughter would like Monica to visit as well for community support information.     PHYSICAL EXAM:   VITALS: Today's Vitals   06/10/22 1725  BP: (!) 90/58  Pulse: (!) 58  Resp: 18  SpO2: 96%  PainSc: 0-No pain    LUNGS: clear CARDIAC:  regular, no JVD EXTREMITIES: MAEx 4, no edema SKIN: WNL   Barbette Merino, RN

## 2022-06-12 DIAGNOSIS — F0394 Unspecified dementia, unspecified severity, with anxiety: Secondary | ICD-10-CM | POA: Diagnosis not present

## 2022-06-12 DIAGNOSIS — F0393 Unspecified dementia, unspecified severity, with mood disturbance: Secondary | ICD-10-CM | POA: Diagnosis not present

## 2022-06-12 DIAGNOSIS — G4700 Insomnia, unspecified: Secondary | ICD-10-CM | POA: Diagnosis not present

## 2022-06-12 DIAGNOSIS — I3139 Other pericardial effusion (noninflammatory): Secondary | ICD-10-CM | POA: Diagnosis not present

## 2022-06-12 DIAGNOSIS — Z8619 Personal history of other infectious and parasitic diseases: Secondary | ICD-10-CM | POA: Diagnosis not present

## 2022-06-12 DIAGNOSIS — N189 Chronic kidney disease, unspecified: Secondary | ICD-10-CM | POA: Diagnosis not present

## 2022-06-12 DIAGNOSIS — I69351 Hemiplegia and hemiparesis following cerebral infarction affecting right dominant side: Secondary | ICD-10-CM | POA: Diagnosis not present

## 2022-06-12 DIAGNOSIS — I679 Cerebrovascular disease, unspecified: Secondary | ICD-10-CM | POA: Diagnosis not present

## 2022-06-12 DIAGNOSIS — F418 Other specified anxiety disorders: Secondary | ICD-10-CM | POA: Diagnosis not present

## 2022-06-12 DIAGNOSIS — I1310 Hypertensive heart and chronic kidney disease without heart failure, with stage 1 through stage 4 chronic kidney disease, or unspecified chronic kidney disease: Secondary | ICD-10-CM | POA: Diagnosis not present

## 2022-06-12 DIAGNOSIS — F05 Delirium due to known physiological condition: Secondary | ICD-10-CM | POA: Diagnosis not present

## 2022-06-12 DIAGNOSIS — F03918 Unspecified dementia, unspecified severity, with other behavioral disturbance: Secondary | ICD-10-CM | POA: Diagnosis not present

## 2022-06-13 NOTE — Progress Notes (Signed)
COMMUNITY PALLIATIVE CARE SW NOTE  PATIENT NAME: Jeanne Lopez DOB: [birth year] MRN: [id number]  PRIMARY CARE PROVIDER: Merri Brunette, MD  RESPONSIBLE PARTY:  Acct ID - Guarantor Home Phone Work Phone Relationship Acct Type  [id number] SISSY, KETNER* [phone] Self P/F     [address], [place], [place] [address]   Initial Palliative Care Telephonic Encounter  PC SW completed a telephonic visit with patient's daughter-[name]. [name] was provided education on the palliative care program. She provided an initial verbal consent and status update on patient.   Patient is getting over having a virus, but is doing much better according to the daughter. She receives PT through [facility]. Patient is weaker overall, but walks independently. She does not have any urinary or bowel issues. Her daughter assists with personal care tasks. She has increased confusion. Patient is requiring more attention overall, and the daughter reports feeling "lost and overwhelmed". SW provided education and contact number for [employer]. SW also scheduled a follow-up visit for 06/09/22 @ 12:30 pm.   Social History   Tobacco Use   Smoking status: Never   Smokeless tobacco: Never  Substance Use Topics   Alcohol use: No    CODE STATUS: Full code ADVANCED DIRECTIVES: No MOST FORM COMPLETE: No HOSPICE EDUCATION PROVIDED: Yes  Duration of encounter and documentation: 30 minutes  [employer], LCSW

## 2022-06-19 DIAGNOSIS — N189 Chronic kidney disease, unspecified: Secondary | ICD-10-CM | POA: Diagnosis not present

## 2022-06-19 DIAGNOSIS — I69351 Hemiplegia and hemiparesis following cerebral infarction affecting right dominant side: Secondary | ICD-10-CM | POA: Diagnosis not present

## 2022-06-19 DIAGNOSIS — I3139 Other pericardial effusion (noninflammatory): Secondary | ICD-10-CM | POA: Diagnosis not present

## 2022-06-19 DIAGNOSIS — F03918 Unspecified dementia, unspecified severity, with other behavioral disturbance: Secondary | ICD-10-CM | POA: Diagnosis not present

## 2022-06-19 DIAGNOSIS — F0394 Unspecified dementia, unspecified severity, with anxiety: Secondary | ICD-10-CM | POA: Diagnosis not present

## 2022-06-19 DIAGNOSIS — I1310 Hypertensive heart and chronic kidney disease without heart failure, with stage 1 through stage 4 chronic kidney disease, or unspecified chronic kidney disease: Secondary | ICD-10-CM | POA: Diagnosis not present

## 2022-06-19 DIAGNOSIS — F05 Delirium due to known physiological condition: Secondary | ICD-10-CM | POA: Diagnosis not present

## 2022-06-19 DIAGNOSIS — F418 Other specified anxiety disorders: Secondary | ICD-10-CM | POA: Diagnosis not present

## 2022-06-19 DIAGNOSIS — F0393 Unspecified dementia, unspecified severity, with mood disturbance: Secondary | ICD-10-CM | POA: Diagnosis not present

## 2022-07-08 ENCOUNTER — Ambulatory Visit: Payer: Medicare PPO | Admitting: Neurology

## 2022-07-08 ENCOUNTER — Encounter: Payer: Self-pay | Admitting: Neurology

## 2022-07-08 VITALS — BP 143/64 | HR 67 | Ht 61.0 in | Wt 125.0 lb

## 2022-07-08 DIAGNOSIS — G43009 Migraine without aura, not intractable, without status migrainosus: Secondary | ICD-10-CM | POA: Diagnosis not present

## 2022-07-08 DIAGNOSIS — F0390 Unspecified dementia without behavioral disturbance: Secondary | ICD-10-CM | POA: Diagnosis not present

## 2022-07-08 MED ORDER — GABAPENTIN 100 MG PO CAPS: ORAL_CAPSULE | ORAL | 3 refills | Status: DC

## 2022-07-08 MED ORDER — MEMANTINE HCL 10 MG PO TABS: 10.0000 mg | ORAL_TABLET | Freq: Two times a day (BID) | ORAL | 3 refills | Status: DC

## 2022-07-08 MED ORDER — DONEPEZIL HCL 5 MG PO TABS: 5.0000 mg | ORAL_TABLET | Freq: Every day | ORAL | 3 refills | Status: DC

## 2022-07-08 NOTE — Progress Notes (Signed)
PATIENT: Jeanne Lopez DOB: 1932-11-24  REASON FOR VISIT: follow up for migraine and memory HISTORY FROM: patient, daughter Jeanne Lopez PRIMARY NEUROLOGIST: Dr. Lucia Gaskins   HISTORY OF PRESENT ILLNESS: Today 07/08/22  Jeanne Lopez on a train trip few days prior, got a virus with cough, daughter found her slide off the bed, she was confused and weak. Admitted 05/08/2022 for AMS and global weakness/subjective right-sided weakness. TIA versus viral syndrome.  MRI of the brain motion degraded exam no evidence of acute infarct.  There was advanced chronic small vessel changes.  Mild to moderate generalized parenchymal atrophy.  Neurology recommended DAPT aspirin and Plavix for 3 weeks then aspirin alone.  LDL 112, started Lipitor 40.  A1c 5.5.  Felt viral syndrome/bronchitis with evidence of sinusitis likely causing presentation.  Given Augmentin. Dr. Pearlean Brownie felt could be unwitnessed syncopal episode.  He recommended check EEG for seizures and cardiac monitoring for syncope. Essentially full recovery, some low energy. She stopped aspirin, Lipitor after discussing with PCP. Today MMSE 14/30. Remains on Namenda 10 mg twice daily, gabapentin 100 mg once a day, Aricept 5 mg reduced to 5 mg due to HR. This past month, had 3 migraines which is not typical. Usually 1 a month. Takes Imitrex with excellent, has always been the only thing that works. Doesn't drive. Not going outside alone anymore, yard is uneven. Needs supervision with ADLs.  07/03/21 SS: Jeanne Lopez is here today for follow-up. MMSE 19/30.On gabapentin and Imitrex for headache. Aricept and Namenda for memory. 1 headache in the last year, takes Imitrex with excellent benefit. Memory is stable, lives with Jeanne Lopez, does her own ADLs, still walks her dog. Feels well, looks good, no new issues. HR was 48 today but asymptomatic.   Update 06/28/2020 SS: Jeanne Lopez is an 89 year old female with history of migraine headache and memory disorder.  On low-dose gabapentin and  Imitrex for headache.  On Aricept and Namenda for memory.  Is doing quite well, her headaches have essentially resolved.  About 6 weeks ago, she did have a few days of headache, Imitrex works great.  She rarely has to take Imitrex.  Memory is stable, noted no changes.  Lives with her daughter, Jeanne Lopez.  No major issues.  She does her own ADLs.  Enjoys her little dog.  No recent falls.  Her right femur fracture has completely healed.  Her overall health is good.  Here today for evaluation accompanied by Jeanne Lopez.  MMSE 18/30 today.  HISTORY 12/26/2019 SS: Jeanne Lopez is an 89 year old female history of migraine headache and memory disorder.  She is on low-dose gabapentin, Imitrex for headache.  She is on Aricept 10 mg daily, and Namenda.  She suffered a fall, with right femur fracture in August.  She completed rehab at [facility].  Is now back at home, where her daughter lives with her.  She is walking with a walker, remains in PT.  Daughter has noted some decline in memory, is more forgetful.  She has felt that Namenda has been helpful.  She is sleeping fairly well, has a good appetite.  Continues to enjoy being with her little dog.  Headaches are rare, she did have one today, took Imitrex with good benefit.  Prior, was several months ago.  Presents today for follow-up via virtual visit.   REVIEW OF SYSTEMS: Out of a complete 14 system review of symptoms, the patient complains only of the following symptoms, and all other reviewed systems are negative.  See HPI  ALLERGIES: Allergies  Allergen Reactions   Codeine Nausea And Vomiting    HOME MEDICATIONS: Outpatient Medications Prior to Visit  Medication Sig Dispense Refill   acetaminophen (TYLENOL) 325 MG tablet Take 2 tablets (650 mg total) by mouth every 6 (six) hours as needed for mild pain (or Fever >/= 101).     Ascorbic Acid (VITAMIN C PO) Take 1 tablet by mouth daily.     docusate sodium (COLACE) 100 MG capsule Take 200 mg by mouth daily.      donepezil (ARICEPT) 5 MG tablet Take 5 mg by mouth at bedtime.     ferrous sulfate 325 (65 FE) MG tablet Take 325 mg by mouth daily with breakfast.     gabapentin (NEURONTIN) 100 MG capsule TAKE (1) CAPSULE TWICE DAILY. 180 capsule 3   memantine (NAMENDA) 10 MG tablet Take 1 tablet (10 mg total) by mouth 2 (two) times daily. 180 tablet 3   Multiple Vitamin (MULTIVITAMIN WITH MINERALS) TABS tablet Take 1 tablet by mouth daily.     polyethylene glycol (MIRALAX / GLYCOLAX) packet Take 17 g by mouth daily as needed for mild constipation.      SUMAtriptan (IMITREX) 50 MG tablet Take 1 tablet (50 mg total) by mouth every 2 (two) hours as needed. May repeat in 2 hours if headache persists or recurs. 10 tablet 3   traZODone (DESYREL) 100 MG tablet Take 1 tablet (100 mg total) by mouth at bedtime.     venlafaxine XR (EFFEXOR-XR) 150 MG 24 hr capsule Take 150 mg by mouth daily.     VITAMIN D PO Take 1 tablet by mouth daily.     aspirin EC 81 MG tablet Take 1 tablet (81 mg total) by mouth daily. Swallow whole. 30 tablet 12   atorvastatin (LIPITOR) 40 MG tablet Take 1 tablet (40 mg total) by mouth daily. 30 tablet 11   donepezil (ARICEPT) 10 MG tablet Take 1 tablet (10 mg total) by mouth at bedtime. (Patient taking differently: Take 5 mg by mouth at bedtime.) 90 tablet 3   guaiFENesin-dextromethorphan (ROBITUSSIN DM) 100-10 MG/5ML syrup Take 10 mLs by mouth every 6 (six) hours as needed for cough. 118 mL 0   No facility-administered medications prior to visit.    PAST MEDICAL HISTORY: Past Medical History:  Diagnosis Date   Anxiety    Depression    Heart murmur    Hypertension    Insomnia    Memory disorder 04/02/2017   Meniere's disease    Migraine    Osteoporosis     PAST SURGICAL HISTORY: Past Surgical History:  Procedure Laterality Date   APPENDECTOMY     BREAST LUMPECTOMY Right    DILATION AND CURETTAGE OF UTERUS     FEMUR IM NAIL Right 10/06/2019   Procedure: INTRAMEDULLARY (IM) NAIL  FEMORAL;  Surgeon: Myrene Galas, MD;  Location: [facility] OR;  Service: Orthopedics;  Laterality: Right;    FAMILY HISTORY: Family History  Problem Relation Age of Onset   Hypertension Brother    Cancer Sister    Hypertension Sister    Hypertension Mother    Heart attack Mother    Hypertension Father    Stroke Father    Alcohol abuse Daughter    Depression Daughter    Drug abuse Daughter    Alcohol abuse Son    Depression Son    Drug abuse Son    CVA Maternal Grandmother    Kidney disease Neg Hx    Hyperlipidemia Neg Hx  Heart disease Neg Hx    Early death Neg Hx    Arthritis Neg Hx     SOCIAL HISTORY: Social History   Socioeconomic History   Marital status: Widowed    Spouse name: Not on file   Number of children: 3   Years of education: College   Highest education level: Not on file  Occupational History   Occupation: Retired  Tobacco Use   Smoking status: Never    Passive exposure: Never   Smokeless tobacco: Never  Vaping Use   Vaping Use: Never used  Substance and Sexual Activity   Alcohol use: No   Drug use: No   Sexual activity: Not Currently  Other Topics Concern   Not on file  Social History Narrative   Lives with daughter and dog.    Grandchildren in frequently to help her.    Retired.    Right handed    Caffeine use: Coffee every morning, 1 glass tea daily   Social Determinants of Health   Financial Resource Strain: Low Risk  (03/18/2018)   Overall Financial Resource Strain (CARDIA)    Difficulty of Paying Living Expenses: Not hard at all  Food Insecurity: No Food Insecurity (05/08/2022)   Hunger Vital Sign    Worried About Running Out of Food in the Last Year: Never true    Ran Out of Food in the Last Year: Never true  Transportation Needs: No Transportation Needs (05/08/2022)   PRAPARE - Administrator, Civil Service (Medical): No    Lack of Transportation (Non-Medical): No  Physical Activity: Sufficiently Active (03/18/2018)    Exercise Vital Sign    Days of Exercise per Week: 7 days    Minutes of Exercise per Session: 30 min  Stress: No Stress Concern Present (03/18/2018)   [employer] of Occupational Health - Occupational Stress Questionnaire    Feeling of Stress : Not at all  Social Connections: Somewhat Isolated (03/18/2018)   Social Connection and Isolation Panel [NHANES]    Frequency of Communication with Friends and Family: More than three times a week    Frequency of Social Gatherings with Friends and Family: Twice a week    Attends Religious Services: More than 4 times per year    Active Member of [employer] or Organizations: No    Attends Banker Meetings: Never    Marital Status: Widowed  Intimate Partner Violence: Not At Risk (05/08/2022)   Humiliation, Afraid, Rape, and Kick questionnaire    Fear of Current or Ex-Partner: No    Emotionally Abused: No    Physically Abused: No    Sexually Abused: No   PHYSICAL EXAM  Vitals:   07/08/22 1437  BP: (!) 143/64  Pulse: 67  Weight: 125 lb (56.7 kg)  Height: 5\' 1"  (1.549 m)    Body mass index is 23.62 kg/m.  Generalized: Well developed, in no acute distress     07/08/2022    2:39 PM 07/03/2021    3:08 PM 06/28/2020    2:51 PM  MMSE - Mini Mental State Exam  Orientation to time 1 3 3   Orientation to Place 5 5 4   Registration 0 3 3  Attention/ Calculation 0 0 0  Recall 0 0 0  Language- name 2 objects 2 2 2   Language- repeat 1 1 1   Language- follow 3 step command 3 3 3   Language- read & follow direction 1 1 1   Write a sentence 1 1 1   Copy  design 0 0 0  Total score 14 19 18    Neurological examination  Mentation: Alert, cooperative, very pleasant, engaged, history mostly provided by her daughter,  Some more difficulty with commands today than in the past, speech and language fluent Cranial nerve II-XII: Pupils were equal round reactive to light. Extraocular movements were full, visual field were full on confrontational test.  Facial sensation and strength were normal. Head turning and shoulder shrug  were normal and symmetric. Motor: The motor testing reveals 5 over 5 strength of all 4 extremities. Good symmetric motor tone is noted throughout.  Sensory: Sensory testing is intact to soft touch on all 4 extremities. No evidence of extinction is noted.  Coordination: Cerebellar testing reveals good finger-nose-finger and heel-to-shin bilaterally.  Gait and station: Gait is slightly wide-based, but steady and independent. Reflexes: Deep tendon reflexes are symmetric and normal bilaterally.   DIAGNOSTIC DATA (LABS, IMAGING, TESTING) - I reviewed patient records, labs, notes, testing and imaging myself where available.  Lab Results  Component Value Date   WBC 10.2 05/09/2022   HGB 11.9 (L) 05/09/2022   HCT 35.8 (L) 05/09/2022   MCV 95.5 05/09/2022   PLT 129 (L) 05/09/2022      Component Value Date/Time   NA 137 05/09/2022 0735   K 3.2 (L) 05/09/2022 0735   CL 106 05/09/2022 0735   CO2 22 05/09/2022 0735   GLUCOSE 95 05/09/2022 0735   BUN 13 05/09/2022 0735   CREATININE 0.93 05/09/2022 0735   CREATININE 0.77 07/08/2017 1125   CALCIUM 8.2 (L) 05/09/2022 0735   PROT 6.1 (L) 05/08/2022 1214   ALBUMIN 3.5 05/08/2022 1214   AST 27 05/08/2022 1214   ALT 17 05/08/2022 1214   ALKPHOS 61 05/08/2022 1214   BILITOT 0.9 05/08/2022 1214   GFRNONAA 59 (L) 05/09/2022 0735   GFRNONAA 72 07/08/2017 1125   GFRAA >60 10/11/2019 0449   GFRAA 83 07/08/2017 1125   Lab Results  Component Value Date   CHOL 184 05/08/2022   HDL 60 05/08/2022   LDLCALC 112 (H) 05/08/2022   TRIG 58 05/08/2022   CHOLHDL 3.1 05/08/2022   Lab Results  Component Value Date   HGBA1C 5.5 05/08/2022   Lab Results  Component Value Date   VITAMINB12 874 08/16/2013   Lab Results  Component Value Date   TSH 1.290 05/08/2022    ASSESSMENT AND PLAN 89 y.o. year old female   1.  Migraine headaches -Under excellent control, maybe 1 a  month -Continue gabapentin 100 mg twice daily -Continue Imitrex as needed for acute headache, we have discussed the risk of using a triptan given her age, but it's the only thing that works, and uses so rarely at this point, doesn't want to try anything else  2.  Dementia, MMSE 14/30 (19/30) -Some decline since last seen -Continue Aricept 5 mg daily, reduced from 10 mg due to low heart rate, continue Namenda 10 mg twice daily  3.  Syncope/viral syndrome March 2024 -MRI negative for acute infarct -Had recommended aspirin 81 mg daily, Lipitor, she has since stopped these -EEG, outpatient cardiac monitor was recommended by Dr. Pearlean Brownie but do not wish to pursue at this time -Monitor for any further evidence -Follow-up with me in 1 year or sooner if needed  Otila Kluver, DNP 07/08/2022, 2:59 PM [facility] [address] [place], [place] [address] [phone]

## 2022-08-06 ENCOUNTER — Telehealth: Payer: Self-pay | Admitting: Neurology

## 2022-08-06 NOTE — Telephone Encounter (Signed)
Daughter is asking for a call to discuss migraines and agitation pt is having.

## 2022-08-06 NOTE — Telephone Encounter (Signed)
That does not sound typical for the patient. I would recommend she see her primary care doctor to rule out underlying infection or illness as trigger, check labs. Had event of acute confusion in March triggered by Viral Illness. Thanks

## 2022-08-06 NOTE — Telephone Encounter (Signed)
Call to daughter in regards to migraines and  agitation. Over the last 2 weeks Imitrex has not been as helpful and patient seems agitated in late afternoon and sometimes wakes up agitated. Daughter verbalized that she has been encouraging more water, staying out of heat, and decreasing stimulation. Advised I will send to Maralyn Sago, NP for advice and recommendations. Daughter appreciative of call.

## 2022-08-06 NOTE — Telephone Encounter (Signed)
1st attempt.    Left msg to call back.

## 2022-08-06 NOTE — Telephone Encounter (Signed)
I spoke with the patient's daughter and relayed the message from Maralyn Sago. Her daughter stated she had not noticed any signs of illness or infection, however she would be happy to have her primary care rule it out. I informed her to call back if her PCP rules out any underlying cause. She verbalized understanding and expressed appreciation for the call.

## 2022-08-11 DIAGNOSIS — D485 Neoplasm of uncertain behavior of skin: Secondary | ICD-10-CM | POA: Diagnosis not present

## 2022-08-11 DIAGNOSIS — L820 Inflamed seborrheic keratosis: Secondary | ICD-10-CM | POA: Diagnosis not present

## 2022-08-11 DIAGNOSIS — C44319 Basal cell carcinoma of skin of other parts of face: Secondary | ICD-10-CM | POA: Diagnosis not present

## 2022-08-11 DIAGNOSIS — Z85828 Personal history of other malignant neoplasm of skin: Secondary | ICD-10-CM | POA: Diagnosis not present

## 2022-08-12 DIAGNOSIS — F0390 Unspecified dementia without behavioral disturbance: Secondary | ICD-10-CM | POA: Diagnosis not present

## 2022-08-12 DIAGNOSIS — N1831 Chronic kidney disease, stage 3a: Secondary | ICD-10-CM | POA: Diagnosis not present

## 2022-08-12 DIAGNOSIS — G43009 Migraine without aura, not intractable, without status migrainosus: Secondary | ICD-10-CM | POA: Diagnosis not present

## 2022-08-12 DIAGNOSIS — I071 Rheumatic tricuspid insufficiency: Secondary | ICD-10-CM | POA: Diagnosis not present

## 2022-08-12 DIAGNOSIS — M810 Age-related osteoporosis without current pathological fracture: Secondary | ICD-10-CM | POA: Diagnosis not present

## 2022-08-12 DIAGNOSIS — I129 Hypertensive chronic kidney disease with stage 1 through stage 4 chronic kidney disease, or unspecified chronic kidney disease: Secondary | ICD-10-CM | POA: Diagnosis not present

## 2022-08-12 DIAGNOSIS — R8290 Unspecified abnormal findings in urine: Secondary | ICD-10-CM | POA: Diagnosis not present

## 2022-08-20 NOTE — Telephone Encounter (Signed)
Prolia VOB initiated via [email or web address]  Next Prolia inj DUE: 10/08/22

## 2022-09-01 ENCOUNTER — Telehealth: Payer: Self-pay | Admitting: Neurology

## 2022-09-01 NOTE — Telephone Encounter (Signed)
She can be placed on wait list.  Routing to phone room to let the pt know

## 2022-09-01 NOTE — Telephone Encounter (Signed)
Pt daughter asking if pt can see a doctor. Stated she can't wait until 10/1 for pt to see Maralyn Sago.

## 2022-09-01 NOTE — Telephone Encounter (Signed)
Called daughter back and informed her. She appreciated the call back and will cont to call to check for any cx.

## 2022-09-02 ENCOUNTER — Ambulatory Visit: Payer: Medicare PPO | Admitting: Neurology

## 2022-09-02 ENCOUNTER — Encounter: Payer: Self-pay | Admitting: Neurology

## 2022-09-02 VITALS — BP 119/53 | HR 55 | Ht 62.5 in | Wt 125.0 lb

## 2022-09-02 DIAGNOSIS — I10 Essential (primary) hypertension: Secondary | ICD-10-CM

## 2022-09-02 DIAGNOSIS — F418 Other specified anxiety disorders: Secondary | ICD-10-CM

## 2022-09-02 DIAGNOSIS — R2689 Other abnormalities of gait and mobility: Secondary | ICD-10-CM | POA: Diagnosis not present

## 2022-09-02 DIAGNOSIS — F0390 Unspecified dementia without behavioral disturbance: Secondary | ICD-10-CM

## 2022-09-02 DIAGNOSIS — G43009 Migraine without aura, not intractable, without status migrainosus: Secondary | ICD-10-CM

## 2022-09-02 MED ORDER — UBRELVY 100 MG PO TABS: 50.0000 mg | ORAL_TABLET | ORAL | Status: DC | PRN

## 2022-09-02 MED ORDER — GABAPENTIN 100 MG PO CAPS: ORAL_CAPSULE | ORAL | 3 refills | Status: DC

## 2022-09-02 MED ORDER — UBRELVY 50 MG PO TABS: 50.0000 mg | ORAL_TABLET | ORAL | 11 refills | Status: DC

## 2022-09-02 NOTE — Patient Instructions (Addendum)
Check MRI of the brain with changes reported Increase gabapentin 200 mg twice daily  Try Ubrelvy at onset of migraine, may repeat in 2 hours if needed, try to use instead of Imitrex  Check EEG Keep me posted   Orders Placed This Encounter  Procedures   MR BRAIN WO CONTRAST   EEG adult

## 2022-09-02 NOTE — Progress Notes (Signed)
PATIENT: Jeanne Lopez DOB: 07-16-1932  REASON FOR VISIT: follow up for migraine and memory HISTORY FROM: patient, daughter [name] PRIMARY NEUROLOGIST: Dr. Lucia Gaskins   ASSESSMENT AND PLAN 89 y.o. year old female   1.  Chronic migraine headaches 2.  Dementia 3.  Syncope/viral syndrome March 2024 (MRI negative for acute infarct)  -Presenting today with worsening confusion, frequent migraine, gait instability, slurred speech, dysmetria with left finger-nose-finger.  Daughter has noted change since June 2024.  Has been to PCP, infectious or metabolic abnormality has been ruled out. -Check MRI of the brain rule out acute brain abnormality, stroke in the setting of above  -Check EEG, Dr. Pearlean Brownie mentioned at hospital discharge, prolonged cardiac monitor, they decided to hold off, start with EEG -Increase gabapentin to 200 mg twice daily for migraine prevention -Try Ubrelvy 50 mg as needed for acute headache, limit Imitrex given age, vascular risk factors.  Nurtec was not helpful. -Next steps: Consider Depakote for headaches, mood -Continue Aricept 5 mg daily, Namenda 10 mg twice daily -Aspirin 81 mg daily as been recommended  -Follow-up 6 months or sooner if needed  HISTORY OF PRESENT ILLNESS: Today 09/02/22  Here for earlier revisit, more headaches, send her to bed. 3 a week. Few months ago, daughter noticed more agitation, confusion, afterwards noted more migraines. Drank more water, avoid head, avoid foods. Typical migraine, frontal, behind eyes. Having to take 2 Imitrex. Taking gabapentin 100 mg BID. Not pattern for occurrence determined. No new medications. Saw PCP no infection determined. Tried Nurtec sample, didn't help. Daughter feels she is agitated, anxious, something to calm her down, calm her brain down. No falls reported.  She seems more confused to me, repeats herself, gait instability, dysmetria with left finger-nose-finger, slight slurring speech.  07/08/22 SS; Went on a train trip  few days prior, got a virus with cough, daughter found her slide off the bed, she was confused and weak. Admitted 05/08/2022 for AMS and global weakness/subjective right-sided weakness. TIA versus viral syndrome.  MRI of the brain motion degraded exam no evidence of acute infarct.  There was advanced chronic small vessel changes.  Mild to moderate generalized parenchymal atrophy.  Neurology recommended DAPT aspirin and Plavix for 3 weeks then aspirin alone.  LDL 112, started Lipitor 40.  A1c 5.5.  Felt viral syndrome/bronchitis with evidence of sinusitis likely causing presentation.  Given Augmentin. Dr. Pearlean Brownie felt could be unwitnessed syncopal episode.  He recommended check EEG for seizures and cardiac monitoring for syncope. Essentially full recovery, some low energy. She stopped aspirin, Lipitor after discussing with PCP. Today MMSE 14/30. Remains on Namenda 10 mg twice daily, gabapentin 100 mg once a day, Aricept 5 mg reduced to 5 mg due to HR. This past month, had 3 migraines which is not typical. Usually 1 a month. Takes Imitrex with excellent, has always been the only thing that works. Doesn't drive. Not going outside alone anymore, yard is uneven. Needs supervision with ADLs.  07/03/21 SS: Jeanne Lopez is here today for follow-up. MMSE 19/30.On gabapentin and Imitrex for headache. Aricept and Namenda for memory. 1 headache in the last year, takes Imitrex with excellent benefit. Memory is stable, lives with [name], does her own ADLs, still walks her dog. Feels well, looks good, no new issues. HR was 48 today but asymptomatic.   Update 06/28/2020 SS: Jeanne Lopez is an 89 year old female with history of migraine headache and memory disorder.  On low-dose gabapentin and Imitrex for headache.  On Aricept and  Namenda for memory.  Is doing quite well, her headaches have essentially resolved.  About 6 weeks ago, she did have a few days of headache, Imitrex works great.  She rarely has to take Imitrex.  Memory  is stable, noted no changes.  Lives with her daughter, [name].  No major issues.  She does her own ADLs.  Enjoys her little dog.  No recent falls.  Her right femur fracture has completely healed.  Her overall health is good.  Here today for evaluation accompanied by [name].  MMSE 18/30 today.  HISTORY 12/26/2019 SS: Jeanne Lopez is an 89 year old female history of migraine headache and memory disorder.  She is on low-dose gabapentin, Imitrex for headache.  She is on Aricept 10 mg daily, and Namenda.  She suffered a fall, with right femur fracture in August.  She completed rehab at [facility].  Is now back at home, where her daughter lives with her.  She is walking with a walker, remains in PT.  Daughter has noted some decline in memory, is more forgetful.  She has felt that Namenda has been helpful.  She is sleeping fairly well, has a good appetite.  Continues to enjoy being with her little dog.  Headaches are rare, she did have one today, took Imitrex with good benefit.  Prior, was several months ago.  Presents today for follow-up via virtual visit.   REVIEW OF SYSTEMS: Out of a complete 14 system review of symptoms, the patient complains only of the following symptoms, and all other reviewed systems are negative.  See HPI  ALLERGIES: Allergies  Allergen Reactions   Codeine Nausea And Vomiting    HOME MEDICATIONS: Outpatient Medications Prior to Visit  Medication Sig Dispense Refill   acetaminophen (TYLENOL) 325 MG tablet Take 2 tablets (650 mg total) by mouth every 6 (six) hours as needed for mild pain (or Fever >/= 101).     Ascorbic Acid (VITAMIN C PO) Take 1 tablet by mouth daily.     docusate sodium (COLACE) 100 MG capsule Take 200 mg by mouth daily.     donepezil (ARICEPT) 5 MG tablet Take 1 tablet (5 mg total) by mouth at bedtime. 90 tablet 3   ferrous sulfate 325 (65 FE) MG tablet Take 325 mg by mouth daily with breakfast.     memantine (NAMENDA) 10 MG tablet Take 1 tablet (10 mg  total) by mouth 2 (two) times daily. 180 tablet 3   Multiple Vitamin (MULTIVITAMIN WITH MINERALS) TABS tablet Take 1 tablet by mouth daily.     polyethylene glycol (MIRALAX / GLYCOLAX) packet Take 17 g by mouth daily as needed for mild constipation.      SUMAtriptan (IMITREX) 50 MG tablet Take 1 tablet (50 mg total) by mouth every 2 (two) hours as needed. May repeat in 2 hours if headache persists or recurs. 10 tablet 3   traZODone (DESYREL) 100 MG tablet Take 1 tablet (100 mg total) by mouth at bedtime.     venlafaxine XR (EFFEXOR-XR) 150 MG 24 hr capsule Take 150 mg by mouth daily.     VITAMIN D PO Take 1 tablet by mouth daily.     gabapentin (NEURONTIN) 100 MG capsule TAKE (1) CAPSULE TWICE DAILY. 180 capsule 3   No facility-administered medications prior to visit.    PAST MEDICAL HISTORY: Past Medical History:  Diagnosis Date   Anxiety    Depression    Heart murmur    Hypertension    Insomnia  Memory disorder 04/02/2017   Meniere's disease    Migraine    Osteoporosis     PAST SURGICAL HISTORY: Past Surgical History:  Procedure Laterality Date   APPENDECTOMY     BREAST LUMPECTOMY Right    DILATION AND CURETTAGE OF UTERUS     FEMUR IM NAIL Right 10/06/2019   Procedure: INTRAMEDULLARY (IM) NAIL FEMORAL;  Surgeon: Myrene Galas, MD;  Location: [facility] OR;  Service: Orthopedics;  Laterality: Right;    FAMILY HISTORY: Family History  Problem Relation Age of Onset   Hypertension Brother    Cancer Sister    Hypertension Sister    Hypertension Mother    Heart attack Mother    Hypertension Father    Stroke Father    Alcohol abuse Daughter    Depression Daughter    Drug abuse Daughter    Alcohol abuse Son    Depression Son    Drug abuse Son    CVA Maternal Grandmother    Kidney disease Neg Hx    Hyperlipidemia Neg Hx    Heart disease Neg Hx    Early death Neg Hx    Arthritis Neg Hx     SOCIAL HISTORY: Social History   Socioeconomic History   Marital status:  Widowed    Spouse name: Not on file   Number of children: 3   Years of education: College   Highest education level: Not on file  Occupational History   Occupation: Retired  Tobacco Use   Smoking status: Never    Passive exposure: Never   Smokeless tobacco: Never  Vaping Use   Vaping status: Never Used  Substance and Sexual Activity   Alcohol use: No   Drug use: No   Sexual activity: Not Currently  Other Topics Concern   Not on file  Social History Narrative   Lives with daughter and dog.    Grandchildren in frequently to help her.    Retired.    Right handed    Caffeine use: Coffee every morning, 1 glass tea daily   Social Determinants of Health   Financial Resource Strain: Low Risk  (03/18/2018)   Overall Financial Resource Strain (CARDIA)    Difficulty of Paying Living Expenses: Not hard at all  Food Insecurity: No Food Insecurity (05/08/2022)   Hunger Vital Sign    Worried About Running Out of Food in the Last Year: Never true    Ran Out of Food in the Last Year: Never true  Transportation Needs: No Transportation Needs (05/08/2022)   PRAPARE - Administrator, Civil Service (Medical): No    Lack of Transportation (Non-Medical): No  Physical Activity: Sufficiently Active (03/18/2018)   Exercise Vital Sign    Days of Exercise per Week: 7 days    Minutes of Exercise per Session: 30 min  Stress: No Stress Concern Present (03/18/2018)   [employer] of Occupational Health - Occupational Stress Questionnaire    Feeling of Stress : Not at all  Social Connections: Somewhat Isolated (03/18/2018)   Social Connection and Isolation Panel [NHANES]    Frequency of Communication with Friends and Family: More than three times a week    Frequency of Social Gatherings with Friends and Family: Twice a week    Attends Religious Services: More than 4 times per year    Active Member of [employer] or Organizations: No    Attends Banker Meetings: Never    Marital  Status: Widowed  Intimate Partner Violence: Not At  Risk (05/08/2022)   Humiliation, Afraid, Rape, and Kick questionnaire    Fear of Current or Ex-Partner: No    Emotionally Abused: No    Physically Abused: No    Sexually Abused: No   PHYSICAL EXAM  Vitals:   09/02/22 1459 09/02/22 1534  BP: (!) 150/61 (!) 119/53  Pulse: (!) 55   Weight: 125 lb (56.7 kg)   Height: 5' 2.5" (1.588 m)    Body mass index is 22.5 kg/m.  Generalized: Well developed, in no acute distress     07/08/2022    2:39 PM 07/03/2021    3:08 PM 06/28/2020    2:51 PM  MMSE - Mini Mental State Exam  Orientation to time 1 3 3   Orientation to Place 5 5 4   Registration 0 3 3  Attention/ Calculation 0 0 0  Recall 0 0 0  Language- name 2 objects 2 2 2   Language- repeat 1 1 1   Language- follow 3 step command 3 3 3   Language- read & follow direction 1 1 1   Write a sentence 1 1 1   Copy design 0 0 0  Total score 14 19 18    Neurological examination  Mentation: Alert, cooperative, repeats herself, relies on her daughter for history.  Speech is somewhat slurred.  More difficulty with following exam commands.  Looks tired. Cranial nerve II-XII: Pupils were equal round reactive to light. Extraocular movements were full, visual field were full on confrontational test. Facial sensation and strength were normal. Head turning and shoulder shrug  were normal and symmetric. Motor: Good strength overall.  No muscle weakness noted. Sensory: Sensory testing is intact to soft touch on all 4 extremities. No evidence of extinction is noted.  Coordination: Dysmetria with left finger-nose-finger Gait and station: Gait is slightly wide-based, unsteady year than previous Reflexes: Deep tendon reflexes are symmetric and normal bilaterally.   DIAGNOSTIC DATA (LABS, IMAGING, TESTING) - I reviewed patient records, labs, notes, testing and imaging myself where available.  Lab Results  Component Value Date   WBC 10.2 05/09/2022   HGB  11.9 (L) 05/09/2022   HCT 35.8 (L) 05/09/2022   MCV 95.5 05/09/2022   PLT 129 (L) 05/09/2022      Component Value Date/Time   NA 137 05/09/2022 0735   K 3.2 (L) 05/09/2022 0735   CL 106 05/09/2022 0735   CO2 22 05/09/2022 0735   GLUCOSE 95 05/09/2022 0735   BUN 13 05/09/2022 0735   CREATININE 0.93 05/09/2022 0735   CREATININE 0.77 07/08/2017 1125   CALCIUM 8.2 (L) 05/09/2022 0735   PROT 6.1 (L) 05/08/2022 1214   ALBUMIN 3.5 05/08/2022 1214   AST 27 05/08/2022 1214   ALT 17 05/08/2022 1214   ALKPHOS 61 05/08/2022 1214   BILITOT 0.9 05/08/2022 1214   GFRNONAA 59 (L) 05/09/2022 0735   GFRNONAA 72 07/08/2017 1125   GFRAA >60 10/11/2019 0449   GFRAA 83 07/08/2017 1125   Lab Results  Component Value Date   CHOL 184 05/08/2022   HDL 60 05/08/2022   LDLCALC 112 (H) 05/08/2022   TRIG 58 05/08/2022   CHOLHDL 3.1 05/08/2022   Lab Results  Component Value Date   HGBA1C 5.5 05/08/2022   Lab Results  Component Value Date   VITAMINB12 874 08/16/2013   Lab Results  Component Value Date   TSH 1.290 05/08/2022   Margie Ege, AGNP-C, DNP 09/02/2022, 3:36 PM [facility] [address] [place], [place] [address] [phone]

## 2022-09-03 ENCOUNTER — Telehealth: Payer: Self-pay

## 2022-09-03 ENCOUNTER — Telehealth: Payer: Self-pay | Admitting: Neurology

## 2022-09-03 ENCOUNTER — Other Ambulatory Visit (HOSPITAL_COMMUNITY): Payer: Self-pay

## 2022-09-03 NOTE — Telephone Encounter (Signed)
Pharmacy Patient Advocate Encounter  Received notification from [facility] that Prior Authorization for Ubrelvy 50MG  tablets has been APPROVED from 02/10/2022 to 02/10/2023. Ran test claim, Copay is $40.00 for a 30DS.  PA #/Case ID/Reference #: PA Case ID: [id number]

## 2022-09-03 NOTE — Telephone Encounter (Signed)
Pharmacy Patient Advocate Encounter   Received notification from CoverMyMeds that prior authorization for Ubrelvy 50MG  tablets is required/requested.   Insurance verification completed.   The patient is insured through [place] .   Per test claim: PA submitted to [facility] via CoverMyMeds Key/confirmation #/EOC [id number] Status is pending

## 2022-09-03 NOTE — Telephone Encounter (Signed)
Cohere Berkley Harvey: [id number] exp. 09/03/22-11/02/22 sent to [facility] [phone]

## 2022-09-03 NOTE — Telephone Encounter (Signed)
   Insurance is concerned about PT taking both Sumatriptan and Ubrelvy Please advise.

## 2022-09-11 ENCOUNTER — Ambulatory Visit: Payer: Medicare PPO | Admitting: Neurology

## 2022-09-11 DIAGNOSIS — R569 Unspecified convulsions: Secondary | ICD-10-CM | POA: Diagnosis not present

## 2022-09-11 DIAGNOSIS — G43009 Migraine without aura, not intractable, without status migrainosus: Secondary | ICD-10-CM

## 2022-09-11 DIAGNOSIS — F0390 Unspecified dementia without behavioral disturbance: Secondary | ICD-10-CM

## 2022-09-11 NOTE — Procedures (Signed)
    History:  89 year old woman with paroxysmal event concerning for seizures  EEG classification: Awake  Duration: 27 minutes   Technical aspects: This EEG study was done with scalp electrodes positioned according to the 10-20 International system of electrode placement. Electrical activity was reviewed with band pass filter of 1-70Hz , sensitivity of 7 uV/mm, display speed of 50mm/sec with a 60Hz  notched filter applied as appropriate. EEG data were recorded continuously and digitally stored.   Description of the recording: The background rhythms of this recording consists of a fairly well modulated medium amplitude theta rhythm of  to 7.5 Hz that is reactive to eye opening and closure. Present in the anterior head region is a 15-20 Hz beta activity. Photic stimulation was performed, did not show any abnormalities. Hyperventilation was not performed. No abnormal epileptiform discharges seen during this recording. There was diffuse slowing. There were no electrographic seizure identified.   Abnormality: Mild diffuse slowing  Impression: This is an abnormal EEG recorded while awake due to mild diffuse slowing. This is consistent with a generalized brain dysfunction, nonspecific.    Windell Norfolk, MD [facility]

## 2022-09-11 NOTE — Telephone Encounter (Signed)
Check Availity for coverage  PA required

## 2022-09-12 NOTE — Progress Notes (Signed)
Please call and inform patient the EEG showed mild diffuse slowing; again, this can be normal in some patient. No seizures, no epileptiform discharges.

## 2022-09-12 NOTE — Telephone Encounter (Signed)
Pt ready for scheduling on or after 10/08/22  Out-of-pocket cost due at time of visit: $0  Primary: [employer] SHP  PPO Prolia co-insurance: 0% Admin fee co-insurance: 0%  Deductible: does not apply  Prior Auth APPROVED PA# [id number] Valid: 03/06/21-02/09/23  Secondary: N/A Prolia co-insurance:  Admin fee co-insurance:  Deductible:  Prior Auth:  PA# Valid:     ** This summary of benefits is an estimation of the patient's out-of-pocket cost. Exact cost may very based on individual plan coverage.

## 2022-09-12 NOTE — Telephone Encounter (Signed)
Patient Sales executive

## 2022-09-12 NOTE — Telephone Encounter (Signed)
Prior Auth APPROVED PA# [id number] Valid: 03/06/21-02/09/23

## 2022-09-15 ENCOUNTER — Ambulatory Visit: Admission: RE | Admit: 2022-09-15 | Payer: Medicare PPO | Source: Ambulatory Visit

## 2022-09-15 ENCOUNTER — Telehealth: Payer: Self-pay

## 2022-09-15 DIAGNOSIS — F0390 Unspecified dementia without behavioral disturbance: Secondary | ICD-10-CM | POA: Diagnosis not present

## 2022-09-15 DIAGNOSIS — G43009 Migraine without aura, not intractable, without status migrainosus: Secondary | ICD-10-CM

## 2022-09-15 DIAGNOSIS — G43909 Migraine, unspecified, not intractable, without status migrainosus: Secondary | ICD-10-CM | POA: Diagnosis not present

## 2022-09-15 NOTE — Telephone Encounter (Signed)
1st attempt lvm

## 2022-09-15 NOTE — Telephone Encounter (Signed)
Daughter aware of result

## 2022-09-15 NOTE — Telephone Encounter (Signed)
Patient is scheduled for 10/08/2022 at 1:30 for next Prolia injection.

## 2022-09-15 NOTE — Telephone Encounter (Signed)
-----   Message from [facility] sent at 09/12/2022 10:13 AM EDT ----- Please call and inform patient the EEG showed mild diffuse slowing; again, this can be normal in some patient. No seizures, no epileptiform discharges.

## 2022-09-18 ENCOUNTER — Telehealth: Payer: Self-pay | Admitting: Neurology

## 2022-09-18 NOTE — Telephone Encounter (Signed)
I called the patient, spoke with her daughter. Reviewed MRI brain, no acute changes  or explanation for her headache, confusion. Since increasing gabapentin, only 1 headache, confusion is much better. Overall she is doing much better, pleased.   MRI brain without contrast demonstrating: -Mild to moderate atrophy and ventriculomegaly on ex-vacuo basis. -Moderate chronic small vessel ischemic disease.  At least 5 chronic cerebral microhemorrhages within the left occipital region, better visualized on current study compared to 05/08/2022.   -No acute findings.

## 2022-10-08 ENCOUNTER — Ambulatory Visit: Payer: Medicare PPO

## 2022-10-10 ENCOUNTER — Ambulatory Visit: Payer: Medicare PPO

## 2022-10-10 VITALS — BP 115/70 | HR 91 | Ht 62.5 in | Wt 129.2 lb

## 2022-10-10 DIAGNOSIS — M810 Age-related osteoporosis without current pathological fracture: Secondary | ICD-10-CM

## 2022-10-10 MED ORDER — DENOSUMAB 60 MG/ML ~~LOC~~ SOSY
60.0000 mg | PREFILLED_SYRINGE | Freq: Once | SUBCUTANEOUS | Status: AC
  Administered 2022-10-10: 60 mg via SUBCUTANEOUS

## 2022-10-10 NOTE — Progress Notes (Signed)
After obtaining consent, and per orders of Dr. Gherghe, injection of Prolia   given by Leigh A Roberts. Patient instructed to remain in clinic for 20 minutes afterwards, and to report any adverse reaction to me immediately.  

## 2022-11-01 NOTE — Telephone Encounter (Signed)
Last Prolia inj 10/10/22 Next Prolia inj due 04/11/23

## 2022-11-11 ENCOUNTER — Ambulatory Visit: Payer: Medicare PPO | Admitting: Neurology

## 2022-11-28 IMAGING — CT CT FEMUR *R* W/O CM
1 series · 12 of 14 positions shown, 15 images · non-contrast
Comparison: Plain films right femur 10/06/2019.

CLINICAL DATA: History of a spiral fracture of the right femur due
to a trip and fall 10/05/2019. Continued pain. Evaluate healing.
Subsequent encounter.

EXAM:
CT OF THE LOWER RIGHT EXTREMITY WITHOUT CONTRAST
TECHNIQUE: Multidetector CT imaging of the right lower extremity was performed
according to the standard protocol.

[Series 4: soft tissue lower extremity (person_name) · axial · 0.38mm/px · z∈[-481,-85]mm · 12 of 236 slices shown, 15 images]
[im 19/236  soft-tissue]
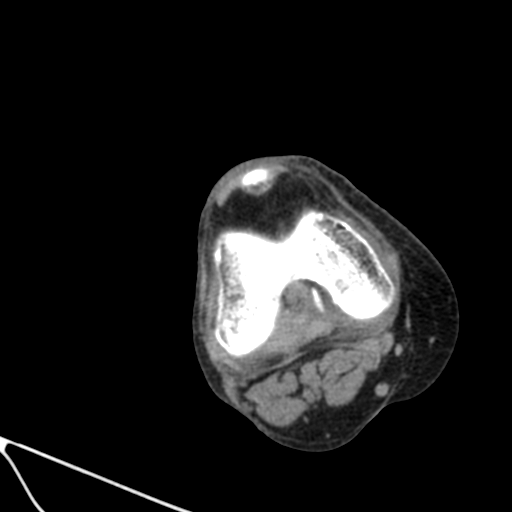
[im 19/236  bone]
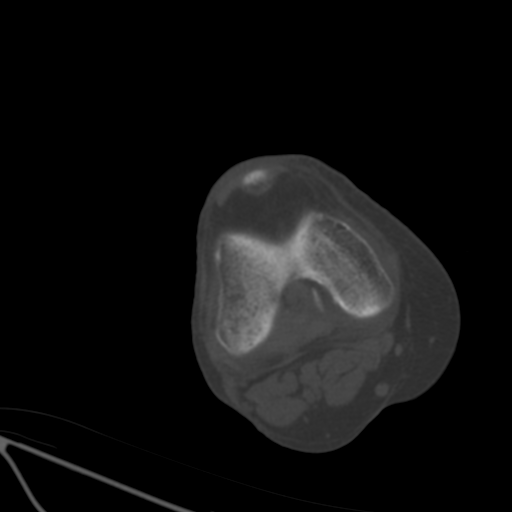
[im 37/236  bone]
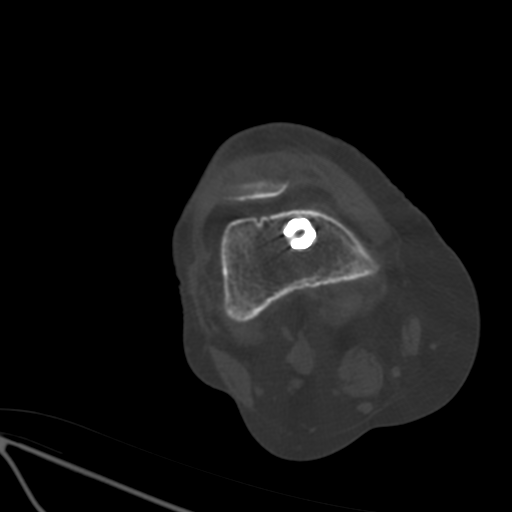
[im 55/236  bone]
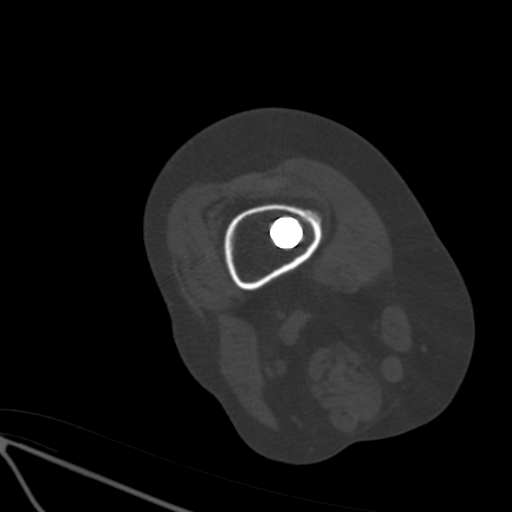
[im 73/236  bone]
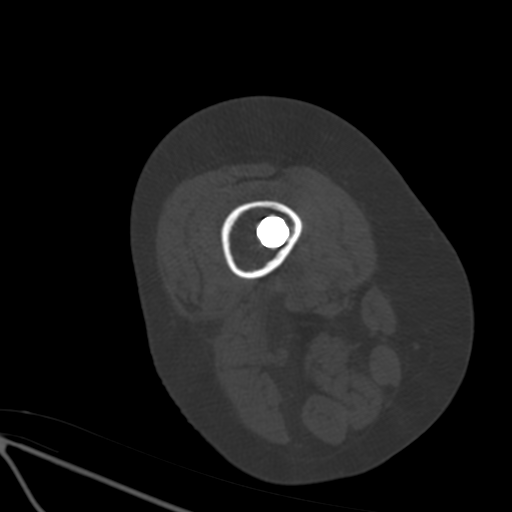
[im 91/236  soft-tissue]
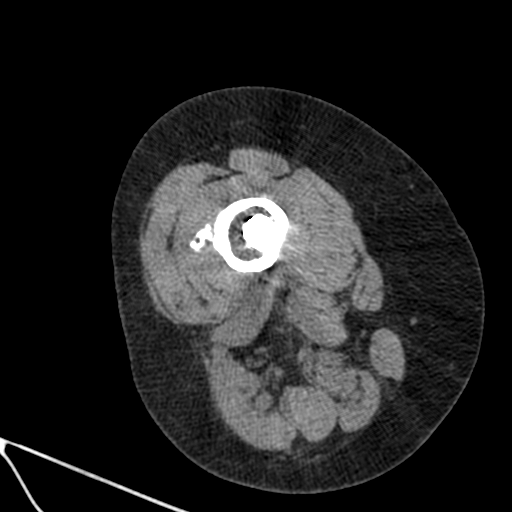
[im 91/236  bone]
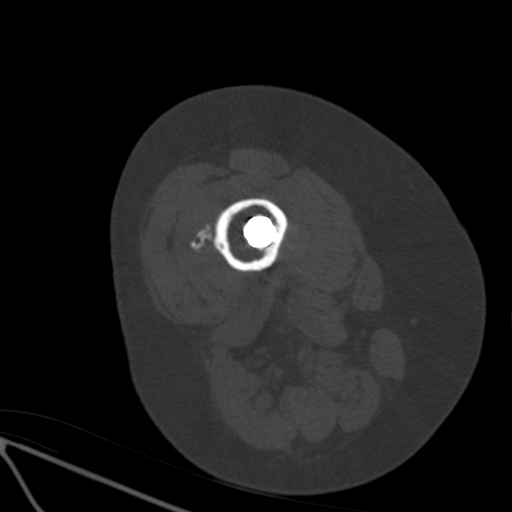
[im 109/236  bone]
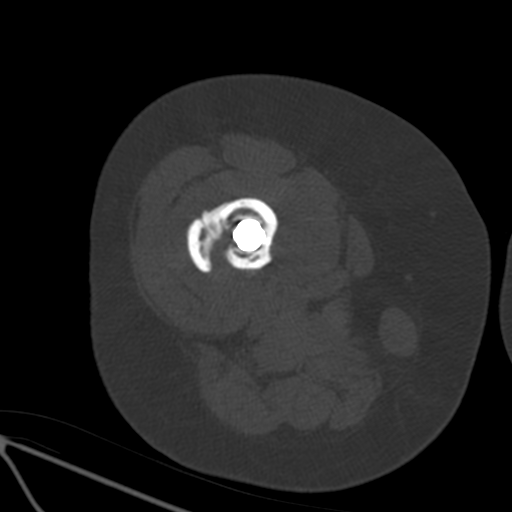
[im 127/236  bone]
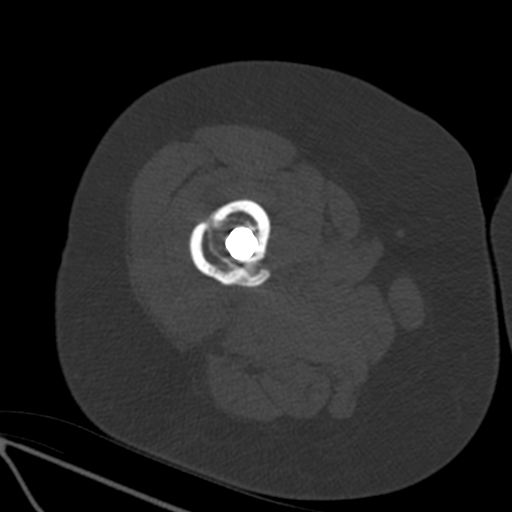
[im 145/236  bone]
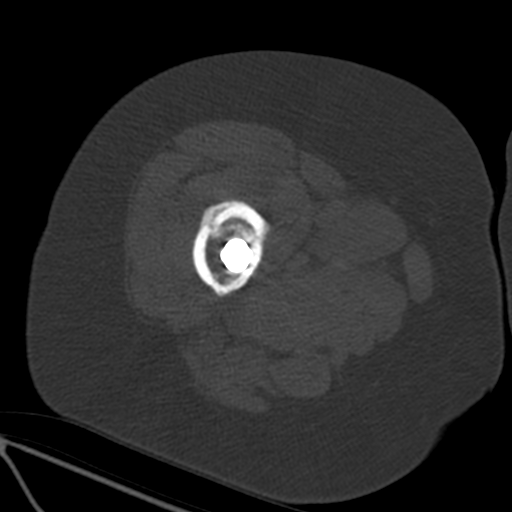
[im 163/236  soft-tissue]
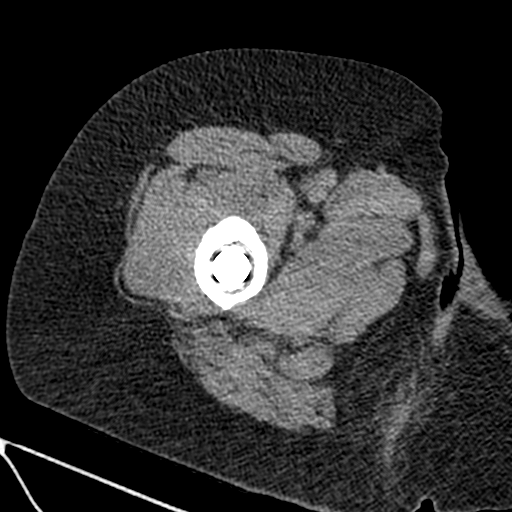
[im 163/236  bone]
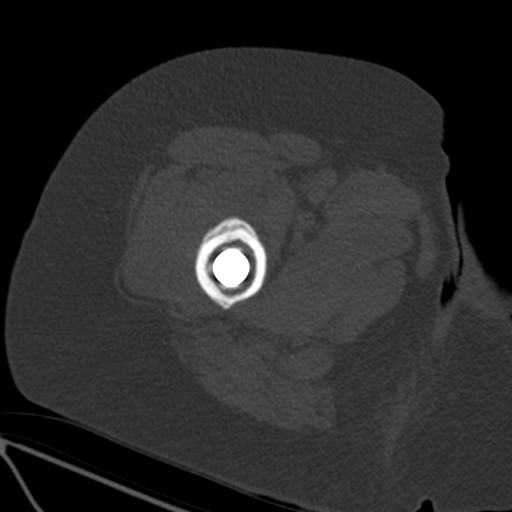
[im 181/236  bone]
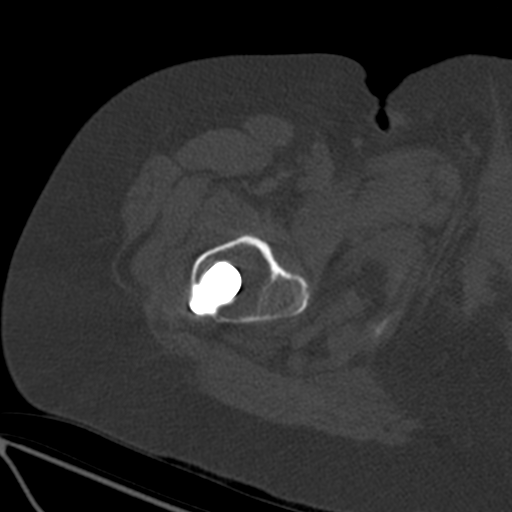
[im 199/236  bone]
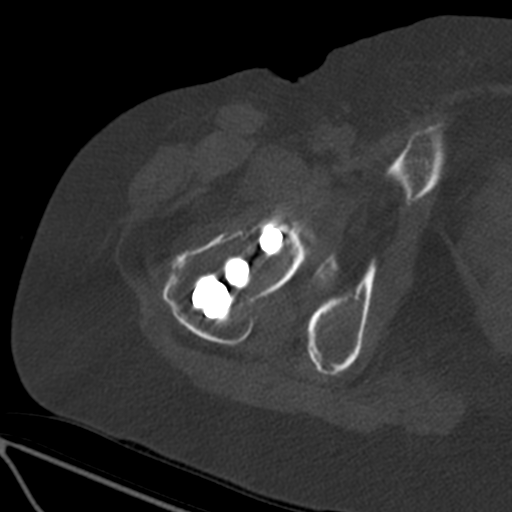
[im 217/236  bone]
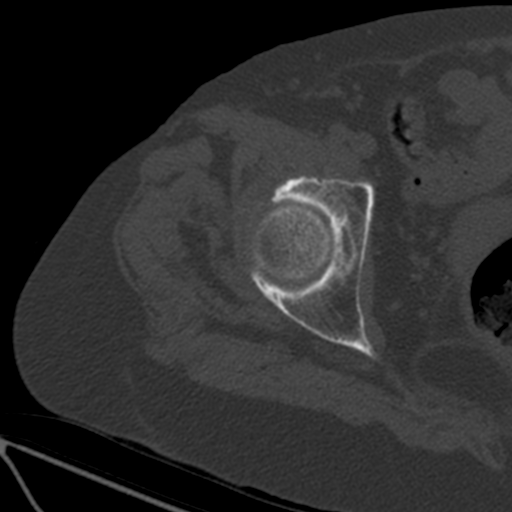

[12 of 14 positions shown; findings below may reference images not displayed]

FINDINGS: Bones/Joint/Cartilage

Intramedullary nail with 2 proximal and 2 distal screws for fixation
of a spiral fracture of the right femur are intact without evidence
of complicating feature. Spiral fracture of the femur is again seen.
There is bridging bone about approximately the superior 8 cm of the
fracture. Inferior to this segment of bridging bone, there is no
bridging bone across the lateral cortex and fracture margins are
corticated extending approximately 5 cm craniocaudal. Fracture
fragments are distracted up to approximately 0.4 cm in this
location. There is some bridging bone anteriorly in this segment of
the fracture. Imaged bones are otherwise negative.

Ligaments

Suboptimally assessed by CT.

Muscles and Tendons

Appear normal.

Soft tissues

Normal.
IMPRESSION: Spiral fracture of the femur is a partial union with bridging bone
seen about approximately 70% of the fracture as described above.

## 2022-12-24 DIAGNOSIS — R399 Unspecified symptoms and signs involving the genitourinary system: Secondary | ICD-10-CM | POA: Diagnosis not present

## 2023-01-15 DIAGNOSIS — N390 Urinary tract infection, site not specified: Secondary | ICD-10-CM | POA: Diagnosis not present

## 2023-02-03 ENCOUNTER — Other Ambulatory Visit (HOSPITAL_COMMUNITY): Payer: Self-pay

## 2023-02-03 ENCOUNTER — Telehealth: Payer: Self-pay

## 2023-02-03 NOTE — Telephone Encounter (Signed)
Pharmacy Patient Advocate Encounter  Received notification from [facility] that Prior Authorization for Ubrelvy 50MG  tablets has been APPROVED from 02/10/2022 to 02/10/2024   PA #/Case ID/Reference #: PA Case ID #: [id number] Key: [id number]

## 2023-02-10 ENCOUNTER — Other Ambulatory Visit: Payer: Self-pay | Admitting: Neurology

## 2023-02-23 ENCOUNTER — Ambulatory Visit: Payer: Medicare PPO | Admitting: Internal Medicine

## 2023-02-23 VITALS — BP 128/70 | HR 58 | Ht 62.8 in | Wt 132.8 lb

## 2023-02-23 DIAGNOSIS — E559 Vitamin D deficiency, unspecified: Secondary | ICD-10-CM

## 2023-02-23 DIAGNOSIS — M810 Age-related osteoporosis without current pathological fracture: Secondary | ICD-10-CM | POA: Diagnosis not present

## 2023-02-23 NOTE — Patient Instructions (Addendum)
 Please continue Prolia.  Please contact me in 05/2023 to order a new bone density scan.  Aim for 1200 mg calcium daily, ideally from the diet.  Please stop at the lab.  You should have an endocrinology follow-up appointment in 1 year.

## 2023-02-23 NOTE — Progress Notes (Signed)
 Patient ID: Jeanne Lopez, female   DOB: [birth year], 89 y.o.   MRN: [id number] HPI  Jeanne Lopez is a 89 y.o.-year-old female, initially referred by Dr. Coolidge Sharps, presenting for follow-up for osteoporosis. Last visit 1 year ago. She is here with her daughter who offers most of the history, especially related to her medical history, symptoms, lab tests, medications as patient has dementia.  Interim history: No falls or fractures since last visit. No vertigo or vision problems. She previously had migraines >> now very infrequently. 04/2022 she was admitted for altered mental status-suspected stroke.  MRI was negative for stroke though.    Reviewed and addended history: Pt was dx with OP in her 56s.  She had: a vertebral fracture found on VFA analysis (01/2013): moderate wedge, L2. Reviewing the chart, she also had a right foot fracture (12/2000). Closed displaced spiral fracture of the right femur (10/05/2019) - after a fall in the yard (lost footing).  She had a rod implanted >> was in rehab >> healed well.  She also has dizziness/vertigo (Meniere's ds.) -Controlled.  Reviewed patient's DXA scan reports:  Date L1-L4 (L2)T score RFN T score LFN T score 33% distal Radius  05/15/2021 ([facility], [place]) L1-L4: -2.3 N/a (h/o R hip fracture) Total left femur: -2.9 -3.3  Ultra distal radius: -6.1  07/16/2015 [facility], [place])  -2.8(+6.8%*)  -2.4 -2.9 -3.9  01/26/2013 ([facility], [place]) -3.2 -2.5 -3.0 -4.4  08/07/2008 ([facility]) L1-L4: -3.0 -3.0 (however, the femur not properly rotated, ROI not properly defined) -2.8 (however, the femur not properly rotated, ROI not properly defined)  n/a  12/16/2000 L1-L4: -3.7 Total Hip: -3.4    12/05/1998 L1-L4: -3.8 Total Hip: -3.3    05/18/1997 L1-L4: -3.7 Total Hip: -3.3     Reviewed previous and current osteoporosis treatment:  - estrogen 1976-1986 - on Fosamax 1986-2003 - on Boniva 2004-2015 -On Prolia :  09/15/2013, 05/30/2014, 11/2014, 06/11/2015, 04/17/2016, 11/12/2016, 06/30/2017, 01/19/2018 Resumed 05/30/2020, 03/29/2021, 10/02/2021, 04/08/2022, 10/10/2022  No history of hyperparathyroidism.  No history of hyperparathyroidism.  No history of kidney stones.  She has has had some low calcium  readings from when she was in the hospital in 03/2018 and 09/2019, most likely due to IV fluids, but again in 04/2022. 08/15/2022: Ca corrected: 9.86 Lab Results  Component Value Date   PTH 25.9 08/11/2013   CALCIUM  8.2 (L) 05/09/2022   CALCIUM  8.6 (L) 05/08/2022   CALCIUM  10.1 02/21/2021   CALCIUM  8.9 10/11/2019   CALCIUM  8.8 (L) 10/10/2019   CALCIUM  8.8 (L) 10/09/2019   CALCIUM  8.7 (L) 10/08/2019   CALCIUM  8.3 (L) 10/07/2019   CALCIUM  9.3 10/05/2019   CALCIUM  8.5 (L) 03/22/2018   No history of thyrotoxicosis: Lab Results  Component Value Date   TSH 1.290 05/08/2022  03/21/2020: TSH 2.74  Vitamin D  levels were reviewed: Lab Results  Component Value Date   VD25OH 57.97 02/21/2021   VD25OH 24.62 (L) 10/07/2019   VD25OH 48.05 07/08/2017   VD25OH 39.30 07/08/2016   VD25OH 48.51 06/11/2015   VD25OH 34.91 06/09/2014   She continues on vitamin D  1000 units daily.  She eats plenty of green leafy vegetables, and not much dairy.  No CKD. Last BUN/Cr:  Lab Results  Component Value Date   BUN 13 05/09/2022   CREATININE 0.93 05/09/2022   ROS: + Per HPI  I reviewed pt's medications, allergies, PMH, social hx, family hx, and changes were documented in the history of present illness. Otherwise, unchanged from  my initial visit note.  Past Medical History:  Diagnosis Date   Anxiety    Depression    Heart murmur    Hypertension    Insomnia    Memory disorder 04/02/2017   Meniere's disease    Migraine    Osteoporosis    Past Surgical History:  Procedure Laterality Date   APPENDECTOMY     BREAST LUMPECTOMY Right    DILATION AND CURETTAGE OF UTERUS     FEMUR IM NAIL Right 10/06/2019    Procedure: INTRAMEDULLARY (IM) NAIL FEMORAL;  Surgeon: Celena Sharper, MD;  Location: [facility] OR;  Service: Orthopedics;  Laterality: Right;   History   Social History   Marital Status: Widowed    Spouse Name: N/A    Number of Children: 3   Social History Main Topics   Smoking status: Never Smoker    Smokeless tobacco: Not on file   Alcohol Use: No   Drug Use: No   Social History Narrative   Lives with daughter and dog. Grandchildren in frequently to help her.    Retired.    Current Outpatient Medications on File Prior to Visit  Medication Sig Dispense Refill   acetaminophen  (TYLENOL ) 325 MG tablet Take 2 tablets (650 mg total) by mouth every 6 (six) hours as needed for mild pain (or Fever >/= 101).     Ascorbic Acid  (VITAMIN C PO) Take 1 tablet by mouth daily.     docusate sodium  (COLACE) 100 MG capsule Take 200 mg by mouth daily.     donepezil  (ARICEPT ) 5 MG tablet Take 1 tablet (5 mg total) by mouth at bedtime. 90 tablet 3   ferrous sulfate 325 (65 FE) MG tablet Take 325 mg by mouth daily with breakfast.     gabapentin  (NEURONTIN ) 100 MG capsule Take 2 capsules twice daily 360 capsule 3   memantine  (NAMENDA ) 10 MG tablet Take 1 tablet (10 mg total) by mouth 2 (two) times daily. 180 tablet 3   Multiple Vitamin (MULTIVITAMIN WITH MINERALS) TABS tablet Take 1 tablet by mouth daily.     polyethylene glycol (MIRALAX  / GLYCOLAX ) packet Take 17 g by mouth daily as needed for mild constipation.      SUMAtriptan  (IMITREX ) 50 MG tablet Take 1 tablet (50 mg total) by mouth every 2 (two) hours as needed. May repeat in 2 hours if headache persists or recurs. 10 tablet 0   traZODone  (DESYREL ) 100 MG tablet Take 1 tablet (100 mg total) by mouth at bedtime.     Ubrogepant  (UBRELVY ) 100 MG TABS Take 0.5 tablets (50 mg total) by mouth as needed (0.5 tab PRN).     Ubrogepant  (UBRELVY ) 50 MG TABS Take 1 tablet (50 mg total) by mouth as directed. Take 1 tablet at onset of headache, may repeat in 2 hours,  max is 200 mg 24 hours 12 tablet 11   venlafaxine  XR (EFFEXOR -XR) 150 MG 24 hr capsule Take 150 mg by mouth daily.     VITAMIN D  PO Take 1 tablet by mouth daily.     No current facility-administered medications on file prior to visit.   Allergies  Allergen Reactions   Codeine Nausea And Vomiting   FH: see HPI + - HTN: in sister, mother - CVA: in MGM  PE: BP 128/70   Pulse (!) 58   Ht 5' 2.8 (1.595 m)   Wt 132 lb 12.8 oz (60.2 kg)   SpO2 96%   BMI 23.67 kg/m   Wt Readings from  Last 3 Encounters:  02/23/23 132 lb 12.8 oz (60.2 kg)  10/10/22 129 lb 3.2 oz (58.6 kg)  09/02/22 125 lb (56.7 kg)   Constitutional: normal weight, in NAD, walks with a walker Eyes:  EOMI, no exophthalmos ENT: no neck masses, no cervical lymphadenopathy Cardiovascular: RRR, No RG, + 1/6 SEM Respiratory: CTA B Musculoskeletal: no deformities Skin:no rashes Neurological: no tremor with outstretched hands  Assessment: 1. Osteoporosis - 1 incidental vb fx: moderate wedge - L2 >> healed - s/p 20 years of Bisphosphonates - no SEs - on Boniva previously, now on Prolia    2.  Vitamin D  insufficiency  Plan: 1. Osteoporosis -Age-related/postmenopausal -She was started on Prolia  in 09/2013 but she had a period of time off the medication after 8 injections (up to 01/2018).  At that time, she was lost for follow-up for 2 years and 8 months and ended up having a right femoral fracture in 09/2019.  We discussed at that time that it was likely that most of the Prolia  effect was lost before her fracture.  We also discussed about not leaving more than 6 to 7 months between Prolia  injections.  We resumed this in 05/2022 and she got the injections consistently afterwards.  Latest 2 injections were from 04/08/2022 and 10/10/2022. -She tolerates Prolia  well, without side effects -We did discuss at last visit about the possibility of switching to Reclast but she opted to continue with Prolia . -Further questioning, we  discussed about the recommended intake of calcium .  I advised her to take 1200 mg daily preferentially from the diet but she also takes a multivitamin.  I did not recommend further supplementation. -She declined trying skeletal loading at [facility] -In that case, we discussed about continuing with Prolia  for 10 years or more.  I would consider the new starting point 0/2022 -She obtained a bone density scan before our last visit and this showed improved T-scores.  We discussed about repeating another bone density in 2 years after the previous. -She will be due for another bone density scan 05/2023  >> advised the daughter to contact me to order it -She is not active right now and we discussed about trying to walk more. -I will see her back in1 year  2.  Vitamin D  insufficiency -Continues 1000 units vitamin D  daily + the amount in multivitamin -Latest vitamin D  level was normal -we will recheck this today   Component     Latest Ref Rng 02/23/2023  Vitamin D , 25-Hydroxy     30 - 100 ng/mL 56   Normal.  Lela Fendt, MD PhD [facility]

## 2023-02-24 ENCOUNTER — Encounter: Payer: Self-pay | Admitting: Internal Medicine

## 2023-02-24 ENCOUNTER — Telehealth: Payer: Self-pay

## 2023-02-24 LAB — VITAMIN D 25 HYDROXY (VIT D DEFICIENCY, FRACTURES): Vit D, 25-Hydroxy: 56 ng/mL (ref 30–100)

## 2023-02-24 NOTE — Telephone Encounter (Signed)
-----   Message from Carlus Pavlov sent at 02/24/2023  8:21 AM EST ----- Can you please call pt.:  Patient's vitamin D level is now excellent, at 56.  Please continue the current vitamin D supplement.

## 2023-02-24 NOTE — Telephone Encounter (Signed)
 Patient given results and medication changes as directed by MD. No further questions at this time.

## 2023-03-10 ENCOUNTER — Encounter: Payer: Self-pay | Admitting: Neurology

## 2023-03-10 ENCOUNTER — Ambulatory Visit: Payer: Medicare PPO | Admitting: Neurology

## 2023-03-10 VITALS — BP 136/65 | HR 65 | Ht 62.0 in | Wt 132.0 lb

## 2023-03-10 DIAGNOSIS — G43009 Migraine without aura, not intractable, without status migrainosus: Secondary | ICD-10-CM

## 2023-03-10 DIAGNOSIS — F0390 Unspecified dementia without behavioral disturbance: Secondary | ICD-10-CM | POA: Diagnosis not present

## 2023-03-10 MED ORDER — DONEPEZIL HCL 5 MG PO TABS: 5.0000 mg | ORAL_TABLET | Freq: Every day | ORAL | 3 refills | Status: AC

## 2023-03-10 MED ORDER — GABAPENTIN 100 MG PO CAPS: ORAL_CAPSULE | ORAL | 3 refills | Status: AC

## 2023-03-10 MED ORDER — MEMANTINE HCL 10 MG PO TABS: 10.0000 mg | ORAL_TABLET | Freq: Two times a day (BID) | ORAL | 3 refills | Status: AC

## 2023-03-10 MED ORDER — SUMATRIPTAN SUCCINATE 50 MG PO TABS: 50.0000 mg | ORAL_TABLET | ORAL | 5 refills | Status: AC | PRN

## 2023-03-10 NOTE — Progress Notes (Signed)
PATIENT: Jeanne Lopez DOB: [birth year]  REASON FOR VISIT: follow up for migraine and memory HISTORY FROM: patient, daughter Jeanne Lopez PRIMARY NEUROLOGIST: Dr. Lucia Gaskins   ASSESSMENT AND PLAN 89 y.o. year old female   1.  Chronic migraine headaches 2.  Dementia 3.  Syncope/viral syndrome March 2024 (MRI negative for acute infarct)  -Looks very good today, is more alert, engaged in visit, memory continues to decline, but doing well in living situation with daughter  -Continue gabapentin 200 mg twice daily for headache prevention -Will continue to refill Imitrex 50 mg as needed for severe headache, we have discussed multiple times triptan not preferred due to mechanism of action with age, vascular risk factors.  Has been the only thing that has been helpful.  She has tried [employer] development [employer], [employer]	[place] without benefit. -Next steps: Consider Depakote for headaches, mood -Continue Aricept 5 mg daily, Namenda 10 mg twice daily -Aspirin 81 mg daily as been recommended  -Recommend exercise, brain stimulating activity, management of vascular risk factors with PCP -Follow-up in 1 year or sooner if needed  HISTORY OF PRESENT ILLNESS: Today 03/10/23  MRI of the brain in August 2024 showed mild to moderate atrophy and ventriculomegaly on ex-vacuo basis.  Moderate SVD.  At least 5 chronic cerebral microhemorrhages to the left occipital region.  EEG showed mild slowing. Here with Jeanne Lopez. She looks good today. Migraines are doing good, 3 a month. Still taking higher dose gabapentin 200 mg BID. Takes Imitrex with excellent benefit. Ubrelvy didn't help her. Memory is fair, some worsening short term. Needs more assistance with ADLs, gets confused at times. Sleeping good. Takes trazodone. Mood is good. In the early evening will notice more confusion, but is redirectable. Having right shoulder pain.   09/02/22 SS: Here for earlier revisit, more headaches, send her to bed. 3 a week. Few months ago, daughter noticed more agitation,  confusion, afterwards noted more migraines. Drank more water, avoid head, avoid foods. Typical migraine, frontal, behind eyes. Having to take 2 Imitrex. Taking gabapentin 100 mg BID. Not pattern for occurrence determined. No new medications. Saw PCP no infection determined. Tried Nurtec sample, didn't help. Daughter feels she is agitated, anxious, something to calm her down, calm her brain down. No falls reported.  She seems more confused to me, repeats herself, gait instability, dysmetria with left finger-nose-finger, slight slurring speech.  07/08/22 SS; Went on a train trip few days prior, got a virus with cough, daughter found her slide off the bed, she was confused and weak. Admitted 05/08/2022 for AMS and global weakness/subjective right-sided weakness. TIA versus viral syndrome.  MRI of the brain motion degraded exam no evidence of acute infarct.  There was advanced chronic small vessel changes.  Mild to moderate generalized parenchymal atrophy.  Neurology recommended DAPT aspirin and Plavix for 3 weeks then aspirin alone.  LDL 112, started Lipitor 40.  A1c 5.5.  Felt viral syndrome/bronchitis with evidence of sinusitis likely causing presentation.  Given Augmentin. Dr. Pearlean Brownie felt could be unwitnessed syncopal episode.  He recommended check EEG for seizures and cardiac monitoring for syncope. Essentially full recovery, some low energy. She stopped aspirin, Lipitor after discussing with PCP. Today MMSE 14/30. Remains on Namenda 10 mg twice daily, gabapentin 100 mg once a day, Aricept 5 mg reduced to 5 mg due to HR. This past month, had 3 migraines which is not typical. Usually 1 a month. Takes Imitrex with excellent, has always been the only thing that works. Doesn't drive. Not going outside alone  anymore, yard is uneven. Needs supervision with ADLs.  07/03/21 SS: Jeanne Lopez is here today for follow-up. MMSE 19/30.On gabapentin and Imitrex for headache. Aricept and Namenda for memory. 1 headache in the  last year, takes Imitrex with excellent benefit. Memory is stable, lives with Jeanne Lopez, does her own ADLs, still walks her dog. Feels well, looks good, no new issues. HR was 48 today but asymptomatic.   Update 06/28/2020 SS: Jeanne Lopez is an 89 year old female with history of migraine headache and memory disorder.  On low-dose gabapentin and Imitrex for headache.  On Aricept and Namenda for memory.  Is doing quite well, her headaches have essentially resolved.  About 6 weeks ago, she did have a few days of headache, Imitrex works great.  She rarely has to take Imitrex.  Memory is stable, noted no changes.  Lives with her daughter, Jeanne Lopez.  No major issues.  She does her own ADLs.  Enjoys her little dog.  No recent falls.  Her right femur fracture has completely healed.  Her overall health is good.  Here today for evaluation accompanied by Jeanne Lopez.  MMSE 18/30 today.  HISTORY 12/26/2019 SS: Jeanne Lopez is an 89 year old female history of migraine headache and memory disorder.  She is on low-dose gabapentin, Imitrex for headache.  She is on Aricept 10 mg daily, and Namenda.  She suffered a fall, with right femur fracture in August.  She completed rehab at [facility].  Is now back at home, where her daughter lives with her.  She is walking with a walker, remains in PT.  Daughter has noted some decline in memory, is more forgetful.  She has felt that Namenda has been helpful.  She is sleeping fairly well, has a good appetite.  Continues to enjoy being with her little dog.  Headaches are rare, she did have one today, took Imitrex with good benefit.  Prior, was several months ago.  Presents today for follow-up via virtual visit.   REVIEW OF SYSTEMS: Out of a complete 14 system review of symptoms, the patient complains only of the following symptoms, and all other reviewed systems are negative.  See HPI  ALLERGIES: Allergies  Allergen Reactions   Codeine Nausea And Vomiting    HOME MEDICATIONS: Outpatient  Medications Prior to Visit  Medication Sig Dispense Refill   acetaminophen (TYLENOL) 325 MG tablet Take 2 tablets (650 mg total) by mouth every 6 (six) hours as needed for mild pain (or Fever >/= 101).     Ascorbic Acid (VITAMIN C PO) Take 1 tablet by mouth daily.     docusate sodium (COLACE) 100 MG capsule Take 200 mg by mouth daily.     donepezil (ARICEPT) 5 MG tablet Take 1 tablet (5 mg total) by mouth at bedtime. 90 tablet 3   ferrous sulfate 325 (65 FE) MG tablet Take 325 mg by mouth daily with breakfast.     gabapentin (NEURONTIN) 100 MG capsule Take 2 capsules twice daily 360 capsule 3   memantine (NAMENDA) 10 MG tablet Take 1 tablet (10 mg total) by mouth 2 (two) times daily. 180 tablet 3   Multiple Vitamin (MULTIVITAMIN WITH MINERALS) TABS tablet Take 1 tablet by mouth daily.     SUMAtriptan (IMITREX) 50 MG tablet Take 1 tablet (50 mg total) by mouth every 2 (two) hours as needed. May repeat in 2 hours if headache persists or recurs. 10 tablet 0   traZODone (DESYREL) 100 MG tablet Take 1 tablet (100 mg total) by  mouth at bedtime.     venlafaxine XR (EFFEXOR-XR) 150 MG 24 hr capsule Take 150 mg by mouth daily.     VITAMIN D PO Take 1 tablet by mouth daily.     polyethylene glycol (MIRALAX / GLYCOLAX) packet Take 17 g by mouth daily as needed for mild constipation.  (Patient not taking: Reported on 03/10/2023)     Ubrogepant (UBRELVY) 100 MG TABS Take 0.5 tablets (50 mg total) by mouth as needed (0.5 tab PRN). (Patient not taking: Reported on 03/10/2023)     Ubrogepant (UBRELVY) 50 MG TABS Take 1 tablet (50 mg total) by mouth as directed. Take 1 tablet at onset of headache, may repeat in 2 hours, max is 200 mg 24 hours (Patient not taking: Reported on 03/10/2023) 12 tablet 11   No facility-administered medications prior to visit.    PAST MEDICAL HISTORY: Past Medical History:  Diagnosis Date   Anxiety    Depression    Heart murmur    Hypertension    Insomnia    Memory disorder  04/02/2017   Meniere's disease    Migraine    Osteoporosis     PAST SURGICAL HISTORY: Past Surgical History:  Procedure Laterality Date   APPENDECTOMY     BREAST LUMPECTOMY Right    DILATION AND CURETTAGE OF UTERUS     FEMUR IM NAIL Right 10/06/2019   Procedure: INTRAMEDULLARY (IM) NAIL FEMORAL;  Surgeon: Myrene Galas, MD;  Location: [facility] OR;  Service: Orthopedics;  Laterality: Right;    FAMILY HISTORY: Family History  Problem Relation Age of Onset   Hypertension Brother    Cancer Sister    Hypertension Sister    Hypertension Mother    Heart attack Mother    Hypertension Father    Stroke Father    Alcohol abuse Daughter    Depression Daughter    Drug abuse Daughter    Alcohol abuse Son    Depression Son    Drug abuse Son    CVA Maternal Grandmother    Kidney disease Neg Hx    Hyperlipidemia Neg Hx    Heart disease Neg Hx    Early death Neg Hx    Arthritis Neg Hx     SOCIAL HISTORY: Social History   Socioeconomic History   Marital status: Widowed    Spouse name: Not on file   Number of children: 3   Years of education: College   Highest education level: Not on file  Occupational History   Occupation: Retired  Tobacco Use   Smoking status: Never    Passive exposure: Never   Smokeless tobacco: Never  Vaping Use   Vaping status: Never Used  Substance and Sexual Activity   Alcohol use: No   Drug use: No   Sexual activity: Not Currently  Other Topics Concern   Not on file  Social History Narrative   Lives with daughter and dog.    Grandchildren in frequently to help her.    Retired.    Right handed    Caffeine use: Coffee every morning, 1 glass tea daily   Social Drivers of Health   Financial Resource Strain: Low Risk  (03/18/2018)   Overall Financial Resource Strain (CARDIA)    Difficulty of Paying Living Expenses: Not hard at all  Food Insecurity: No Food Insecurity (05/08/2022)   Hunger Vital Sign    Worried About Running Out of Food in the Last  Year: Never true    Ran Out of Food in  the Last Year: Never true  Transportation Needs: No Transportation Needs (05/08/2022)   PRAPARE - Administrator, Civil Service (Medical): No    Lack of Transportation (Non-Medical): No  Physical Activity: Sufficiently Active (03/18/2018)   Exercise Vital Sign    Days of Exercise per Week: 7 days    Minutes of Exercise per Session: 30 min  Stress: No Stress Concern Present (03/18/2018)   [employer] of Occupational Health - Occupational Stress Questionnaire    Feeling of Stress : Not at all  Social Connections: Somewhat Isolated (03/18/2018)   Social Connection and Isolation Panel [NHANES]    Frequency of Communication with Friends and Family: More than three times a week    Frequency of Social Gatherings with Friends and Family: Twice a week    Attends Religious Services: More than 4 times per year    Active Member of [employer] or Organizations: No    Attends Banker Meetings: Never    Marital Status: Widowed  Intimate Partner Violence: Not At Risk (05/08/2022)   Humiliation, Afraid, Rape, and Kick questionnaire    Fear of Current or Ex-Partner: No    Emotionally Abused: No    Physically Abused: No    Sexually Abused: No   PHYSICAL EXAM  Vitals:   03/10/23 1510  BP: 136/65  Pulse: 65  Weight: 132 lb (59.9 kg)  Height: 5\' 2"  (1.575 m)   Body mass index is 24.14 kg/m.  Generalized: Well developed, in no acute distress     07/08/2022    2:39 PM 07/03/2021    3:08 PM 06/28/2020    2:51 PM  MMSE - Mini Mental State Exam  Orientation to time 1 3 3   Orientation to Place 5 5 4   Registration 0 3 3  Attention/ Calculation 0 0 0  Recall 0 0 0  Language- name 2 objects 2 2 2   Language- repeat 1 1 1   Language- follow 3 step command 3 3 3   Language- read & follow direction 1 1 1   Write a sentence 1 1 1   Copy design 0 0 0  Total score 14 19 18    Neurological examination  Mentation: Alert, cooperative, repeats herself  occasionally, relies on her daughter for history.  Speech is clear.  Looks good today, more alert. Some apraxia.  Cranial nerve II-XII: Pupils were equal round reactive to light. Extraocular movements were full, visual field were full on confrontational test. Facial sensation and strength were normal. Head turning and shoulder shrug  were normal and symmetric. Motor: Good strength overall.  No muscle weakness noted. Sensory: Sensory testing is intact to soft touch on all 4 extremities. No evidence of extinction is noted.  Coordination: Difficulty performing exam commands. Gait and station: Gait is slightly wide-based, steady with walker Reflexes: Deep tendon reflexes are symmetric and normal bilaterally.   DIAGNOSTIC DATA (LABS, IMAGING, TESTING) - I reviewed patient records, labs, notes, testing and imaging myself where available.  Lab Results  Component Value Date   WBC 10.2 05/09/2022   HGB 11.9 (L) 05/09/2022   HCT 35.8 (L) 05/09/2022   MCV 95.5 05/09/2022   PLT 129 (L) 05/09/2022      Component Value Date/Time   NA 137 05/09/2022 0735   K 3.2 (L) 05/09/2022 0735   CL 106 05/09/2022 0735   CO2 22 05/09/2022 0735   GLUCOSE 95 05/09/2022 0735   BUN 13 05/09/2022 0735   CREATININE 0.93 05/09/2022 0735   CREATININE 0.77  07/08/2017 1125   CALCIUM 8.2 (L) 05/09/2022 0735   PROT 6.1 (L) 05/08/2022 1214   ALBUMIN 3.5 05/08/2022 1214   AST 27 05/08/2022 1214   ALT 17 05/08/2022 1214   ALKPHOS 61 05/08/2022 1214   BILITOT 0.9 05/08/2022 1214   GFRNONAA 59 (L) 05/09/2022 0735   GFRNONAA 72 07/08/2017 1125   GFRAA >60 10/11/2019 0449   GFRAA 83 07/08/2017 1125   Lab Results  Component Value Date   CHOL 184 05/08/2022   HDL 60 05/08/2022   LDLCALC 112 (H) 05/08/2022   TRIG 58 05/08/2022   CHOLHDL 3.1 05/08/2022   Lab Results  Component Value Date   HGBA1C 5.5 05/08/2022   Lab Results  Component Value Date   VITAMINB12 874 08/16/2013   Lab Results  Component Value Date    TSH 1.290 05/08/2022   Margie Ege, AGNP-C, DNP 03/10/2023, 3:22 PM [facility] [address] [place], [place] [address] [phone]

## 2023-03-10 NOTE — Patient Instructions (Signed)
Great to see you both today.  We will continue current medications.  May try taking 1/2 of 50 mg Imitrex to see if as good of benefit.  Recommend exercise, brain stimulating activity.  Follow-up in 1 year or sooner if needed.  Thanks!!

## 2023-04-05 NOTE — Telephone Encounter (Signed)
 Prolia VOB initiated via [email or web address]  Next Prolia inj DUE: 04/11/23

## 2023-04-06 DIAGNOSIS — K219 Gastro-esophageal reflux disease without esophagitis: Secondary | ICD-10-CM | POA: Diagnosis not present

## 2023-04-06 DIAGNOSIS — M810 Age-related osteoporosis without current pathological fracture: Secondary | ICD-10-CM | POA: Diagnosis not present

## 2023-04-06 DIAGNOSIS — H8109 Meniere's disease, unspecified ear: Secondary | ICD-10-CM | POA: Diagnosis not present

## 2023-04-06 DIAGNOSIS — G43909 Migraine, unspecified, not intractable, without status migrainosus: Secondary | ICD-10-CM | POA: Diagnosis not present

## 2023-04-06 DIAGNOSIS — Z1331 Encounter for screening for depression: Secondary | ICD-10-CM | POA: Diagnosis not present

## 2023-04-06 DIAGNOSIS — K5900 Constipation, unspecified: Secondary | ICD-10-CM | POA: Diagnosis not present

## 2023-04-06 DIAGNOSIS — N3941 Urge incontinence: Secondary | ICD-10-CM | POA: Diagnosis not present

## 2023-04-06 DIAGNOSIS — G4700 Insomnia, unspecified: Secondary | ICD-10-CM | POA: Diagnosis not present

## 2023-04-06 DIAGNOSIS — E7800 Pure hypercholesterolemia, unspecified: Secondary | ICD-10-CM | POA: Diagnosis not present

## 2023-04-06 DIAGNOSIS — Z0000 Encounter for general adult medical examination without abnormal findings: Secondary | ICD-10-CM | POA: Diagnosis not present

## 2023-04-11 NOTE — Telephone Encounter (Signed)
 Prior Authorization for [facility] APPROVED PA# [id number] Valid:02/11/23-02/10/24

## 2023-04-11 NOTE — Telephone Encounter (Signed)
 [employer] - Prior Authorization REQUIRED for [employer]

## 2023-04-11 NOTE — Telephone Encounter (Addendum)
 Pt ready for scheduling on or after 04/11/23 [employer]  Out-of-pocket cost due at time of visit: $40  Primary: [employer] SHP Prolia co-insurance: $40 Admin fee co-insurance: n/a  Deductible: does not apply  Prior Auth: APPROVED PA# [id number] Valid: 02/11/23-02/10/24  Secondary: N/A Prolia co-insurance:  Admin fee co-insurance:  Deductible:  Prior Auth:  PA# Valid:     ** This summary of benefits is an estimation of the patient's out-of-pocket cost. Exact cost may very based on individual plan coverage.

## 2023-04-14 ENCOUNTER — Ambulatory Visit: Payer: Medicare PPO

## 2023-04-15 ENCOUNTER — Ambulatory Visit

## 2023-04-17 ENCOUNTER — Ambulatory Visit

## 2023-04-17 VITALS — BP 114/60 | HR 56 | Resp 20 | Ht 62.0 in | Wt 130.0 lb

## 2023-04-17 DIAGNOSIS — M810 Age-related osteoporosis without current pathological fracture: Secondary | ICD-10-CM

## 2023-04-17 MED ORDER — DENOSUMAB 60 MG/ML ~~LOC~~ SOSY
60.0000 mg | PREFILLED_SYRINGE | Freq: Once | SUBCUTANEOUS | Status: AC
  Administered 2023-04-17: 60 mg via SUBCUTANEOUS

## 2023-04-17 MED ORDER — DENOSUMAB 60 MG/ML ~~LOC~~ SOSY
60.0000 mg | PREFILLED_SYRINGE | Freq: Once | SUBCUTANEOUS | Status: AC
Start: 2023-10-18 — End: ?

## 2023-04-17 NOTE — Progress Notes (Signed)
 After obtaining consent, and per orders of Dr. Elvera Lennox, injection of Prolia given by Tera Partridge. Patient instructed to remain in clinic for 20 minutes afterwards, and to report any adverse reaction to me immediately.

## 2023-04-20 DIAGNOSIS — Z9849 Cataract extraction status, unspecified eye: Secondary | ICD-10-CM | POA: Diagnosis not present

## 2023-04-20 DIAGNOSIS — H43393 Other vitreous opacities, bilateral: Secondary | ICD-10-CM | POA: Diagnosis not present

## 2023-04-20 DIAGNOSIS — H53143 Visual discomfort, bilateral: Secondary | ICD-10-CM | POA: Diagnosis not present

## 2023-04-20 NOTE — Telephone Encounter (Signed)
 Last Prolia inj 04/17/23 Next Prolia inj due 10/19/23

## 2023-05-18 DIAGNOSIS — R8290 Unspecified abnormal findings in urine: Secondary | ICD-10-CM | POA: Diagnosis not present

## 2023-05-18 DIAGNOSIS — Z03818 Encounter for observation for suspected exposure to other biological agents ruled out: Secondary | ICD-10-CM | POA: Diagnosis not present

## 2023-05-18 DIAGNOSIS — R350 Frequency of micturition: Secondary | ICD-10-CM | POA: Diagnosis not present

## 2023-05-18 DIAGNOSIS — B349 Viral infection, unspecified: Secondary | ICD-10-CM | POA: Diagnosis not present

## 2023-07-01 ENCOUNTER — Telehealth: Payer: Self-pay | Admitting: Neurology

## 2023-07-01 DIAGNOSIS — R42 Dizziness and giddiness: Secondary | ICD-10-CM | POA: Diagnosis not present

## 2023-07-01 DIAGNOSIS — R399 Unspecified symptoms and signs involving the genitourinary system: Secondary | ICD-10-CM | POA: Diagnosis not present

## 2023-07-01 DIAGNOSIS — R531 Weakness: Secondary | ICD-10-CM | POA: Diagnosis not present

## 2023-07-01 NOTE — Telephone Encounter (Signed)
 Call to daughter, advised if needed to be evaluated for vertigo, then would need new referral and also reviewed disease process of vertigo and dementia. Daughter Verbalized understanding.

## 2023-07-01 NOTE — Telephone Encounter (Signed)
 Patient's daughter, Amalia Badder [facility]) Patient started having vertigo on Sunday,06/28/23. Contact PCP office yesterday, sent in a prescription Meclizine 25 mg and take 1 tablet.  every 12 hours.  Would like a call from the nurse to discuss if is part of her dementia.

## 2023-07-09 ENCOUNTER — Ambulatory Visit: Payer: Medicare PPO | Admitting: Neurology

## 2023-08-17 DIAGNOSIS — F419 Anxiety disorder, unspecified: Secondary | ICD-10-CM | POA: Diagnosis not present

## 2023-09-10 DIAGNOSIS — M810 Age-related osteoporosis without current pathological fracture: Secondary | ICD-10-CM | POA: Diagnosis not present

## 2023-09-10 DIAGNOSIS — N189 Chronic kidney disease, unspecified: Secondary | ICD-10-CM | POA: Diagnosis not present

## 2023-09-10 DIAGNOSIS — F05 Delirium due to known physiological condition: Secondary | ICD-10-CM | POA: Diagnosis not present

## 2023-09-10 DIAGNOSIS — F0390 Unspecified dementia without behavioral disturbance: Secondary | ICD-10-CM | POA: Diagnosis not present

## 2023-09-26 NOTE — Telephone Encounter (Signed)
 Prolia  VOB initiated via [email or web address]  Next Prolia  inj DUE: 10/19/23

## 2023-10-01 DIAGNOSIS — F419 Anxiety disorder, unspecified: Secondary | ICD-10-CM | POA: Diagnosis not present

## 2023-10-01 DIAGNOSIS — I071 Rheumatic tricuspid insufficiency: Secondary | ICD-10-CM | POA: Diagnosis not present

## 2023-10-01 DIAGNOSIS — K219 Gastro-esophageal reflux disease without esophagitis: Secondary | ICD-10-CM | POA: Diagnosis not present

## 2023-10-01 DIAGNOSIS — G43009 Migraine without aura, not intractable, without status migrainosus: Secondary | ICD-10-CM | POA: Diagnosis not present

## 2023-10-01 DIAGNOSIS — E7800 Pure hypercholesterolemia, unspecified: Secondary | ICD-10-CM | POA: Diagnosis not present

## 2023-10-01 DIAGNOSIS — R944 Abnormal results of kidney function studies: Secondary | ICD-10-CM | POA: Diagnosis not present

## 2023-10-01 DIAGNOSIS — F0390 Unspecified dementia without behavioral disturbance: Secondary | ICD-10-CM | POA: Diagnosis not present

## 2023-10-01 DIAGNOSIS — F5101 Primary insomnia: Secondary | ICD-10-CM | POA: Diagnosis not present

## 2023-10-05 ENCOUNTER — Encounter: Payer: Self-pay | Admitting: Internal Medicine

## 2023-10-11 DIAGNOSIS — F05 Delirium due to known physiological condition: Secondary | ICD-10-CM | POA: Diagnosis not present

## 2023-10-11 DIAGNOSIS — N189 Chronic kidney disease, unspecified: Secondary | ICD-10-CM | POA: Diagnosis not present

## 2023-10-11 DIAGNOSIS — M810 Age-related osteoporosis without current pathological fracture: Secondary | ICD-10-CM | POA: Diagnosis not present

## 2023-10-11 DIAGNOSIS — F0390 Unspecified dementia without behavioral disturbance: Secondary | ICD-10-CM | POA: Diagnosis not present

## 2023-10-12 NOTE — Telephone Encounter (Signed)
 [employer]  Prior Authorization on file and valid PA# [id number] Valid: 02/11/23-02/10/24

## 2023-10-15 ENCOUNTER — Telehealth: Payer: Self-pay | Admitting: Neurology

## 2023-10-15 NOTE — Telephone Encounter (Signed)
 Pt daughter called to cancel appt  due to not  needing att  Appt Cancel

## 2023-10-23 ENCOUNTER — Ambulatory Visit

## 2023-10-30 ENCOUNTER — Other Ambulatory Visit: Payer: Self-pay | Admitting: Internal Medicine

## 2023-10-30 ENCOUNTER — Ambulatory Visit

## 2023-10-30 VITALS — BP 124/80 | HR 60 | Resp 16

## 2023-10-30 DIAGNOSIS — M810 Age-related osteoporosis without current pathological fracture: Secondary | ICD-10-CM

## 2023-10-30 MED ORDER — DENOSUMAB 60 MG/ML ~~LOC~~ SOSY
60.0000 mg | PREFILLED_SYRINGE | Freq: Once | SUBCUTANEOUS | Status: DC
  Administered 2023-10-30: 60 mg via SUBCUTANEOUS

## 2023-10-30 MED ORDER — DENOSUMAB 60 MG/ML ~~LOC~~ SOSY
60.0000 mg | PREFILLED_SYRINGE | Freq: Once | SUBCUTANEOUS | Status: AC
  Administered 2023-10-30: 60 mg via SUBCUTANEOUS

## 2023-10-30 MED ORDER — DENOSUMAB 60 MG/ML ~~LOC~~ SOSY: 60.0000 mg | PREFILLED_SYRINGE | Freq: Once | SUBCUTANEOUS | Status: DC

## 2023-10-30 NOTE — Progress Notes (Addendum)
 After obtaining consent, and per orders of Dr. Elvera Lennox, injection of Prolia given by Tera Partridge. Patient instructed to remain in clinic for 20 minutes afterwards, and to report any adverse reaction to me immediately.

## 2023-10-30 NOTE — Addendum Note (Signed)
 Addended by: ARELIA DIETRICH SAILOR on: 10/30/2023 04:10 PM   Modules accepted: Orders

## 2023-10-30 NOTE — Telephone Encounter (Signed)
 Patient would like to have her bone density scheduled - has used [facility] in the past

## 2023-11-02 NOTE — Telephone Encounter (Signed)
 Due to recent policy changes, VOB has been re-submitted.

## 2023-11-03 ENCOUNTER — Ambulatory Visit: Admitting: Neurology

## 2023-11-05 NOTE — Telephone Encounter (Signed)
 Last Prolia  inj 10/30/23 Next Prolia  inj 04/29/24

## 2023-11-05 NOTE — Telephone Encounter (Signed)
 [employer]  Patient is ready for scheduling on or after 10/19/23  Out-of-pocket cost due at time of visit: $376.87   Primary: [employer] PPO SHP Prolia  co-insurance: 20% (approximately $331.87) Admin fee co-insurance: $40  Deductible: does not apply  Prior Auth: APPROVED PA# [id number] Valid: 02/11/23-02/10/24  Secondary: N/A Prolia  co-insurance:  Admin fee co-insurance:  Deductible:  Prior Auth:  PA# Valid:   ** This summary of benefits is an estimation of the patient's out-of-pocket cost. Exact cost may vary based on individual plan coverage.

## 2023-11-09 MED ORDER — DENOSUMAB 60 MG/ML ~~LOC~~ SOSY: 60.0000 mg | PREFILLED_SYRINGE | Freq: Once | SUBCUTANEOUS | Status: AC

## 2023-11-09 MED ORDER — DENOSUMAB 60 MG/ML ~~LOC~~ SOSY
60.0000 mg | PREFILLED_SYRINGE | Freq: Once | SUBCUTANEOUS | Status: AC
  Administered 2023-10-30: 60 mg via SUBCUTANEOUS

## 2023-11-09 NOTE — Addendum Note (Signed)
 Addended by: ARELIA DIETRICH SAILOR on: 11/09/2023 04:04 PM   Modules accepted: Orders

## 2023-11-10 DIAGNOSIS — F0390 Unspecified dementia without behavioral disturbance: Secondary | ICD-10-CM | POA: Diagnosis not present

## 2023-11-10 DIAGNOSIS — R8290 Unspecified abnormal findings in urine: Secondary | ICD-10-CM | POA: Diagnosis not present

## 2023-11-10 DIAGNOSIS — F05 Delirium due to known physiological condition: Secondary | ICD-10-CM | POA: Diagnosis not present

## 2023-11-10 DIAGNOSIS — N189 Chronic kidney disease, unspecified: Secondary | ICD-10-CM | POA: Diagnosis not present

## 2023-11-10 DIAGNOSIS — R350 Frequency of micturition: Secondary | ICD-10-CM | POA: Diagnosis not present

## 2023-11-10 DIAGNOSIS — M810 Age-related osteoporosis without current pathological fracture: Secondary | ICD-10-CM | POA: Diagnosis not present

## 2023-12-11 DIAGNOSIS — M810 Age-related osteoporosis without current pathological fracture: Secondary | ICD-10-CM | POA: Diagnosis not present

## 2023-12-11 DIAGNOSIS — N189 Chronic kidney disease, unspecified: Secondary | ICD-10-CM | POA: Diagnosis not present

## 2023-12-11 DIAGNOSIS — F05 Delirium due to known physiological condition: Secondary | ICD-10-CM | POA: Diagnosis not present

## 2023-12-11 DIAGNOSIS — F0390 Unspecified dementia without behavioral disturbance: Secondary | ICD-10-CM | POA: Diagnosis not present

## 2024-01-06 DIAGNOSIS — M545 Low back pain, unspecified: Secondary | ICD-10-CM | POA: Diagnosis not present

## 2024-02-25 ENCOUNTER — Other Ambulatory Visit

## 2024-02-25 ENCOUNTER — Encounter: Payer: Self-pay | Admitting: Internal Medicine

## 2024-02-25 ENCOUNTER — Ambulatory Visit: Payer: Medicare PPO | Admitting: Internal Medicine

## 2024-02-25 VITALS — BP 122/70 | HR 52 | Ht 62.0 in | Wt 138.8 lb

## 2024-02-25 DIAGNOSIS — M810 Age-related osteoporosis without current pathological fracture: Secondary | ICD-10-CM | POA: Diagnosis not present

## 2024-02-25 DIAGNOSIS — E559 Vitamin D deficiency, unspecified: Secondary | ICD-10-CM

## 2024-02-25 NOTE — Patient Instructions (Addendum)
 Please continue Prolia .  Please call and schedule bone density scan at the [facility] Office: [phone].   Please stop at the lab.  You should have an endocrinology follow-up appointment in 1 year.

## 2024-02-25 NOTE — Progress Notes (Signed)
 Patient ID: Jeanne Lopez, female   DOB: February 11, 1932, 89 y.o.   MRN: [id number]  HPI  Jeanne Lopez is a 89 y.o.-year-old female, initially referred by Dr. Coolidge Sharps, presenting for follow-up for osteoporosis. Last visit 1 year ago. She is here with her daughter who offers most of the history, especially related to her medical history, symptoms, lab tests, medications as patient has dementia.  Interim history: No fractures since last visit. She had few falls since last OV, 2 in the last month, in the house, losing balance. She usually uses a walker when she is outside. She is trying to walk outside. 2 days a week she goes to a daycare at [place] where she also exercises. She had vertigo in the last year. She could not get a neurology appt at that time, but this finally resolved. No vision problems. She has R shoulder pain.  Reviewed and addended history: Pt was dx with OP in her 49s.  She had: a vertebral fracture found on VFA analysis (01/2013): moderate wedge, L2. Reviewing the chart, she also had a right foot fracture (12/2000). Closed displaced spiral fracture of the right femur (10/05/2019) - after a fall in the yard (lost footing).  She had a rod implanted >> was in rehab >> healed well.  She also has dizziness/vertigo (Meniere's ds.) -Controlled.  Reviewed patient's DXA scan reports:  Date L1-L4 (L2)T score RFN T score LFN T score 33% distal Radius  05/15/2021 ([facility] [place]) L1-L4: -2.3 N/a (h/o R hip fracture) Total left femur: -2.9 -3.3  Ultra distal radius: -6.1  07/16/2015 [facility] [place])  -2.8(+6.8%*)  -2.4 -2.9 -3.9  01/26/2013 ([facility] [place]) -3.2 -2.5 -3.0 -4.4  08/07/2008 ([facility]) L1-L4: -3.0 -3.0 (however, the femur not properly rotated, ROI not properly defined) -2.8 (however, the femur not properly rotated, ROI not properly defined)  n/a  12/16/2000 L1-L4: -3.7 Total Hip: -3.4    12/05/1998 L1-L4: -3.8 Total Hip: -3.3     05/18/1997 L1-L4: -3.7 Total Hip: -3.3     Reviewed previous and current osteoporosis treatment:  - estrogen 1976-1986 - on Fosamax 1986-2003 - on Boniva 2004-2015 -On Prolia : 09/15/2013, 05/30/2014, 11/2014, 06/11/2015, 04/17/2016, 11/12/2016, 06/30/2017, 01/19/2018 Resumed 05/30/2020, 03/29/2021, 10/02/2021, 04/08/2022, 10/10/2022, 04/17/2023, 10/30/2023  No history of hyperparathyroidism.  No history of hyperparathyroidism.  No history of kidney stones.  She has has had some low calcium  readings from when she was in the hospital in 03/2018 and 09/2019, most likely due to IV fluids, but again in 04/2022. 10/02/2023: Corrected calcium  9.69 Lab Results  Component Value Date   PTH 25.9 08/11/2013   CALCIUM  8.2 (L) 05/09/2022   CALCIUM  8.6 (L) 05/08/2022   CALCIUM  10.1 02/21/2021   CALCIUM  8.9 10/11/2019   CALCIUM  8.8 (L) 10/10/2019   CALCIUM  8.8 (L) 10/09/2019   CALCIUM  8.7 (L) 10/08/2019   CALCIUM  8.3 (L) 10/07/2019   CALCIUM  9.3 10/05/2019   CALCIUM  8.5 (L) 03/22/2018   No history of thyrotoxicosis: Lab Results  Component Value Date   TSH 1.290 05/08/2022  03/21/2020: TSH 2.74  Vitamin D  levels were reviewed: Lab Results  Component Value Date   VD25OH 56 02/23/2023   VD25OH 57.97 02/21/2021   VD25OH 24.62 (L) 10/07/2019   VD25OH 48.05 07/08/2017   VD25OH 39.30 07/08/2016   VD25OH 48.51 06/11/2015   She continues on vitamin D  1000 units daily.  Also, she is on multivitamin.  She eats plenty of green leafy vegetables, and not much dairy.  No CKD. Last BUN/Cr: 10/02/2023: 19/1.08, GFR 49, corrected calcium  9.69 Lab Results  Component Value Date   BUN 13 05/09/2022   CREATININE 0.93 05/09/2022   Her bradycardia was previously investigated by cardiology with negative results.  ROS: + Per HPI  I reviewed pt's medications, allergies, PMH, social hx, family hx, and changes were documented in the history of present illness. Otherwise, unchanged from my initial visit note.  Past  Medical History:  Diagnosis Date   Anxiety    Depression    Heart murmur    Hypertension    Insomnia    Memory disorder 04/02/2017   Meniere's disease    Migraine    Osteoporosis    Past Surgical History:  Procedure Laterality Date   APPENDECTOMY     BREAST LUMPECTOMY Right    DILATION AND CURETTAGE OF UTERUS     FEMUR IM NAIL Right 10/06/2019   Procedure: INTRAMEDULLARY (IM) NAIL FEMORAL;  Surgeon: Celena Sharper, MD;  Location: [facility] OR;  Service: Orthopedics;  Laterality: Right;   History   Social History   Marital Status: Widowed    Spouse Name: N/A    Number of Children: 3   Social History Main Topics   Smoking status: Never Smoker    Smokeless tobacco: Not on file   Alcohol Use: No   Drug Use: No   Social History Narrative   Lives with daughter and dog. Grandchildren in frequently to help her.    Retired.    Current Outpatient Medications on File Prior to Visit  Medication Sig Dispense Refill   acetaminophen  (TYLENOL ) 325 MG tablet Take 2 tablets (650 mg total) by mouth every 6 (six) hours as needed for mild pain (or Fever >/= 101).     Ascorbic Acid  (VITAMIN C PO) Take 1 tablet by mouth daily.     docusate sodium  (COLACE) 100 MG capsule Take 200 mg by mouth daily.     donepezil  (ARICEPT ) 5 MG tablet Take 1 tablet (5 mg total) by mouth at bedtime. 90 tablet 3   ferrous sulfate 325 (65 FE) MG tablet Take 325 mg by mouth daily with breakfast.     gabapentin  (NEURONTIN ) 100 MG capsule Take 2 capsules twice daily 360 capsule 3   memantine  (NAMENDA ) 10 MG tablet Take 1 tablet (10 mg total) by mouth 2 (two) times daily. 180 tablet 3   Multiple Vitamin (MULTIVITAMIN WITH MINERALS) TABS tablet Take 1 tablet by mouth daily.     SUMAtriptan  (IMITREX ) 50 MG tablet Take 1 tablet (50 mg total) by mouth every 2 (two) hours as needed. May repeat in 2 hours if headache persists or recurs. 10 tablet 5   traZODone  (DESYREL ) 100 MG tablet Take 1 tablet (100 mg total) by mouth at  bedtime.     venlafaxine  XR (EFFEXOR -XR) 150 MG 24 hr capsule Take 150 mg by mouth daily.     VITAMIN D  PO Take 1 tablet by mouth daily.     Current Facility-Administered Medications on File Prior to Visit  Medication Dose Route Frequency Provider Last Rate Last Admin   denosumab  (PROLIA ) injection 60 mg  60 mg Subcutaneous Once Aramis Weil, MD       NOREEN ON 05/08/2024] denosumab  (PROLIA ) injection 60 mg  60 mg Subcutaneous Once Marzella Miracle, MD       Allergies  Allergen Reactions   Codeine Nausea And Vomiting   FH: see HPI + - HTN: in sister, mother - CVA: in MGM  PE: BP  122/70   Pulse (!) 52   Ht 5' 2 (1.575 m)   Wt 138 lb 12.8 oz (63 kg)   SpO2 97%   BMI 25.39 kg/m   Wt Readings from Last 3 Encounters:  02/25/24 138 lb 12.8 oz (63 kg)  04/17/23 130 lb (59 kg)  03/10/23 132 lb (59.9 kg)   Constitutional: normal weight, in NAD, walks with a walker Eyes:  EOMI, no exophthalmos ENT: no neck masses, no cervical lymphadenopathy Cardiovascular: bradycardia, RR, No RG, + 1/6 SEM Respiratory: CTA B Musculoskeletal: no deformities Skin:no rashes Neurological:+ mild tremor with outstretched hands  Assessment: 1. Osteoporosis - 1 incidental vb fx: moderate wedge - L2 >> healed - s/p 20 years of Bisphosphonates - no SEs - on Boniva previously, now on Prolia    2.  Vitamin D  insufficiency  Plan: 1. Osteoporosis - Age-related/postmenopausal - Patient with a long history of osteoporosis, with the lowest T-score currently at the level of the 33% distal radius.  She also had very low T-scores at the level of the spine and total hip in the past, but the spine T-scores have improved. -She was started on Prolia  in 09/2013 but she had a period of time off the medication after 80 injections (up to 01/2018).  At that time, she was lost to follow-up for 2 years and 8 months and ended up having a right femoral fracture in 09/2019.  We discussed at that time that it was likely  that most of the Prolia  effect was lost before the fracture.  We restarted Prolia  and I strongly advised her not to be more than 6 to 7 months between injections.  Latest 2 injections were 04/17/2023 and 10/30/2023.  She is now getting them regularly. - She tolerates Prolia  well, without jaw/thigh/hip pain - We did discuss in the past about the possibility of switching to Reclast but she opted to continue with Prolia  -She is taking a multivitamin with calcium  and we discussed about getting approximately 1200 mg of calcium  daily from the supplement but mostly from diet. -She declined trying skeletal loading at [facility] -She is not very active and she has difficulty walking with a walker.  We previously discussed about recommended weightbearing exercises -she is doing chair exercises twice a week at her church daycare.  They also trying to walk outside and I advised her to keep increasing the distance if possible.  She has to use a sit down walker. -We can continue with Prolia  for 10 years or more.  I would consider the new starting point 2022. -She is due for another bone density scan now - I will see her back in a year  2.  Vitamin D  insufficiency - Continues on 1000 units of vitamin D  daily + the amount of the multivitamin - Latest vitamin D  level was normal, at 56 - Will recheck the level today  Orders Placed This Encounter  Procedures   DG Bone Density   VITAMIN D  25 Hydroxy (Vit-D Deficiency, Fractures)   Lela Fendt, MD PhD [facility] Endocrinology

## 2024-02-26 ENCOUNTER — Ambulatory Visit: Payer: Self-pay | Admitting: Internal Medicine

## 2024-02-26 LAB — VITAMIN D 25 HYDROXY (VIT D DEFICIENCY, FRACTURES): Vit D, 25-Hydroxy: 64 ng/mL (ref 30–100)

## 2024-03-08 ENCOUNTER — Telehealth: Payer: Self-pay | Admitting: Neurology

## 2024-03-08 NOTE — Telephone Encounter (Signed)
 Patient's daughter called to reschedule due to icy driveway and sidewalk.

## 2024-03-09 ENCOUNTER — Ambulatory Visit: Payer: Medicare PPO | Admitting: Neurology

## 2024-10-13 ENCOUNTER — Ambulatory Visit: Admitting: Neurology

## 2025-02-20 ENCOUNTER — Ambulatory Visit: Admitting: Internal Medicine
